# Patient Record
Sex: Male | Born: 1940 | Race: White | Hispanic: No | Marital: Married | Smoking: Never smoker
Health system: Southern US, Community
[De-identification: ages and names within clinical notes are randomized; demographics above are authoritative.]

## PROBLEM LIST (undated history)

## (undated) DIAGNOSIS — M1990 Unspecified osteoarthritis, unspecified site: Secondary | ICD-10-CM

## (undated) DIAGNOSIS — I10 Essential (primary) hypertension: Secondary | ICD-10-CM

## (undated) DIAGNOSIS — C801 Malignant (primary) neoplasm, unspecified: Secondary | ICD-10-CM

## (undated) DIAGNOSIS — I499 Cardiac arrhythmia, unspecified: Secondary | ICD-10-CM

## (undated) DIAGNOSIS — I639 Cerebral infarction, unspecified: Secondary | ICD-10-CM

## (undated) HISTORY — DX: Cerebral infarction, unspecified: I63.9

## (undated) HISTORY — PX: TONSILLECTOMY: SUR1361

## (undated) HISTORY — PX: NO PAST SURGERIES: SHX2092

---

## 2020-03-29 DIAGNOSIS — H40013 Open angle with borderline findings, low risk, bilateral: Secondary | ICD-10-CM | POA: Diagnosis not present

## 2020-03-29 DIAGNOSIS — H2513 Age-related nuclear cataract, bilateral: Secondary | ICD-10-CM | POA: Diagnosis not present

## 2020-03-29 DIAGNOSIS — H35033 Hypertensive retinopathy, bilateral: Secondary | ICD-10-CM | POA: Diagnosis not present

## 2020-03-29 DIAGNOSIS — H353132 Nonexudative age-related macular degeneration, bilateral, intermediate dry stage: Secondary | ICD-10-CM | POA: Diagnosis not present

## 2020-04-03 DIAGNOSIS — Z85828 Personal history of other malignant neoplasm of skin: Secondary | ICD-10-CM | POA: Diagnosis not present

## 2020-04-03 DIAGNOSIS — D229 Melanocytic nevi, unspecified: Secondary | ICD-10-CM | POA: Diagnosis not present

## 2020-04-03 DIAGNOSIS — L905 Scar conditions and fibrosis of skin: Secondary | ICD-10-CM | POA: Diagnosis not present

## 2020-04-03 DIAGNOSIS — D1801 Hemangioma of skin and subcutaneous tissue: Secondary | ICD-10-CM | POA: Diagnosis not present

## 2020-09-11 ENCOUNTER — Emergency Department (HOSPITAL_COMMUNITY): Payer: Medicare PPO

## 2020-09-11 ENCOUNTER — Observation Stay (HOSPITAL_COMMUNITY)
Admission: EM | Admit: 2020-09-11 | Discharge: 2020-09-12 | Disposition: A | Discharge status: Home / Self Care | Payer: Medicare PPO | Attending: Family Medicine | Admitting: Family Medicine

## 2020-09-11 ENCOUNTER — Inpatient Hospital Stay (HOSPITAL_COMMUNITY): Payer: Medicare PPO

## 2020-09-11 ENCOUNTER — Other Ambulatory Visit: Payer: Self-pay

## 2020-09-11 ENCOUNTER — Encounter (HOSPITAL_COMMUNITY): Payer: Self-pay

## 2020-09-11 DIAGNOSIS — H538 Other visual disturbances: Secondary | ICD-10-CM | POA: Diagnosis present

## 2020-09-11 DIAGNOSIS — R29818 Other symptoms and signs involving the nervous system: Secondary | ICD-10-CM | POA: Diagnosis not present

## 2020-09-11 DIAGNOSIS — R2681 Unsteadiness on feet: Secondary | ICD-10-CM | POA: Diagnosis not present

## 2020-09-11 DIAGNOSIS — Z7902 Long term (current) use of antithrombotics/antiplatelets: Secondary | ICD-10-CM | POA: Diagnosis not present

## 2020-09-11 DIAGNOSIS — I517 Cardiomegaly: Secondary | ICD-10-CM | POA: Insufficient documentation

## 2020-09-11 DIAGNOSIS — Z79899 Other long term (current) drug therapy: Secondary | ICD-10-CM | POA: Insufficient documentation

## 2020-09-11 DIAGNOSIS — Z20822 Contact with and (suspected) exposure to covid-19: Secondary | ICD-10-CM | POA: Insufficient documentation

## 2020-09-11 DIAGNOSIS — E7800 Pure hypercholesterolemia, unspecified: Secondary | ICD-10-CM

## 2020-09-11 DIAGNOSIS — I63532 Cerebral infarction due to unspecified occlusion or stenosis of left posterior cerebral artery: Secondary | ICD-10-CM | POA: Diagnosis not present

## 2020-09-11 DIAGNOSIS — J9811 Atelectasis: Secondary | ICD-10-CM | POA: Diagnosis not present

## 2020-09-11 DIAGNOSIS — I639 Cerebral infarction, unspecified: Secondary | ICD-10-CM | POA: Diagnosis present

## 2020-09-11 DIAGNOSIS — I6389 Other cerebral infarction: Secondary | ICD-10-CM | POA: Diagnosis not present

## 2020-09-11 LAB — CBC
HCT: 47.3 % (ref 39.0–52.0)
Hemoglobin: 16.6 g/dL (ref 13.0–17.0)
MCH: 32.1 pg (ref 26.0–34.0)
MCHC: 35.1 g/dL (ref 30.0–36.0)
MCV: 91.5 fL (ref 80.0–100.0)
Platelets: 219 10*3/uL (ref 150–400)
RBC: 5.17 MIL/uL (ref 4.22–5.81)
RDW: 12.5 % (ref 11.5–15.5)
WBC: 10.2 10*3/uL (ref 4.0–10.5)
nRBC: 0 % (ref 0.0–0.2)

## 2020-09-11 LAB — COMPREHENSIVE METABOLIC PANEL
ALT: 23 U/L (ref 0–44)
AST: 30 U/L (ref 15–41)
Albumin: 4.7 g/dL (ref 3.5–5.0)
Alkaline Phosphatase: 60 U/L (ref 38–126)
Anion gap: 12 (ref 5–15)
BUN: 18 mg/dL (ref 8–23)
CO2: 24 mmol/L (ref 22–32)
Calcium: 9.8 mg/dL (ref 8.9–10.3)
Chloride: 102 mmol/L (ref 98–111)
Creatinine, Ser: 1.07 mg/dL (ref 0.61–1.24)
GFR, Estimated: >60 mL/min (ref >60)
Glucose, Bld: 95 mg/dL (ref 70–99)
Potassium: 4 mmol/L (ref 3.5–5.1)
Sodium: 138 mmol/L (ref 135–145)
Total Bilirubin: 1.1 mg/dL (ref 0.3–1.2)
Total Protein: 8.2 g/dL — ABNORMAL HIGH (ref 6.5–8.1)

## 2020-09-11 LAB — DIFFERENTIAL
Abs Immature Granulocytes: 0.03 10*3/uL (ref 0.00–0.07)
Basophils Absolute: 0.1 10*3/uL (ref 0.0–0.1)
Basophils Relative: 1 %
Eosinophils Absolute: 0.1 10*3/uL (ref 0.0–0.5)
Eosinophils Relative: 1 %
Immature Granulocytes: 0 %
Lymphocytes Relative: 18 %
Lymphs Abs: 1.8 10*3/uL (ref 0.7–4.0)
Monocytes Absolute: 0.6 10*3/uL (ref 0.1–1.0)
Monocytes Relative: 6 %
Neutro Abs: 7.7 10*3/uL (ref 1.7–7.7)
Neutrophils Relative %: 74 %

## 2020-09-11 LAB — APTT: aPTT: 26 seconds (ref 24–36)

## 2020-09-11 LAB — PROTIME-INR
INR: 1 (ref 0.8–1.2)
Prothrombin Time: 12.9 seconds (ref 11.4–15.2)

## 2020-09-11 LAB — ETHANOL: Alcohol, Ethyl (B): <10 mg/dL (ref <10)

## 2020-09-11 MED ORDER — ACETAMINOPHEN 160 MG/5ML PO SOLN: 650.0000 mg | ORAL | Status: DC | PRN

## 2020-09-11 MED ORDER — CLOPIDOGREL BISULFATE 300 MG PO TABS
300.0000 mg | ORAL_TABLET | Freq: Once | ORAL | Status: AC
  Administered 2020-09-11: 300 mg via ORAL
  Filled 2020-09-11: qty 1

## 2020-09-11 MED ORDER — ENOXAPARIN SODIUM 40 MG/0.4ML ~~LOC~~ SOLN
40.0000 mg | SUBCUTANEOUS | Status: DC
  Filled 2020-09-11: qty 0.4

## 2020-09-11 MED ORDER — SENNOSIDES-DOCUSATE SODIUM 8.6-50 MG PO TABS: 1.0000 | ORAL_TABLET | Freq: Every evening | ORAL | Status: DC | PRN

## 2020-09-11 MED ORDER — GADOBUTROL 1 MMOL/ML IV SOLN
9.5000 mL | Freq: Once | INTRAVENOUS | Status: AC | PRN
  Administered 2020-09-11: 9.5 mL via INTRAVENOUS

## 2020-09-11 MED ORDER — STROKE: EARLY STAGES OF RECOVERY BOOK
Freq: Once | Status: AC
  Filled 2020-09-11: qty 1

## 2020-09-11 MED ORDER — ACETAMINOPHEN 650 MG RE SUPP: 650.0000 mg | RECTAL | Status: DC | PRN

## 2020-09-11 MED ORDER — SODIUM CHLORIDE 0.9% FLUSH: 3.0000 mL | Freq: Once | INTRAVENOUS | Status: DC

## 2020-09-11 MED ORDER — ASPIRIN 325 MG PO TABS
325.0000 mg | ORAL_TABLET | Freq: Once | ORAL | Status: AC
  Administered 2020-09-11: 325 mg via ORAL
  Filled 2020-09-11: qty 1

## 2020-09-11 MED ORDER — CLOPIDOGREL BISULFATE 75 MG PO TABS
75.0000 mg | ORAL_TABLET | Freq: Every day | ORAL | Status: DC
  Administered 2020-09-12: 75 mg via ORAL
  Filled 2020-09-11: qty 1

## 2020-09-11 MED ORDER — ACETAMINOPHEN 325 MG PO TABS: 650.0000 mg | ORAL_TABLET | ORAL | Status: DC | PRN

## 2020-09-11 MED ORDER — ASPIRIN 325 MG PO TABS: 325.0000 mg | ORAL_TABLET | Freq: Every day | ORAL | Status: DC

## 2020-09-11 MED ORDER — ASPIRIN EC 81 MG PO TBEC: 81.0000 mg | DELAYED_RELEASE_TABLET | Freq: Every day | ORAL | Status: DC

## 2020-09-11 MED ORDER — ASPIRIN 300 MG RE SUPP: 300.0000 mg | Freq: Every day | RECTAL | Status: DC

## 2020-09-11 NOTE — ED Triage Notes (Signed)
Pt reports right eye vision changes for the past 2 days, no other symptoms. Pt a.o, denies headache or dizziness.

## 2020-09-11 NOTE — H&P (Signed)
History and Physical    James Ramsey DJS:[id number] DOB: 1941/05/27 DOA: 09/11/2020  PCP: Aretta Nip, MD  Patient coming from: Home  I have personally briefly reviewed patient's old medical records in [place]  Chief Complaint: Visual field change  HPI: James Ramsey is a 80 y.o. male with no significant PMH.  Pt presents to ED with c/o visual field changes.  2 days ago started having blue and red lights in the R visual field of both eyes.  Symptoms are persistent, constant.  Nothing makes better or worse.  No dizziness, no headache, no confusion, no other weakness nor numbness.  PCP sent pt into ED for evaluation.  Did fall off ladder twisting ankle and hitting head some 3 weeks ago or so.  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   Review of Systems: As per HPI, otherwise all review of systems negative.  History reviewed. No pertinent past medical history.  History reviewed. No pertinent surgical history.   reports that he has never smoked. He does not have any smokeless tobacco history on file. He reports current alcohol use. He reports that he does not use drugs.  No Known Allergies  Family History  Problem Relation Age of Onset  . Breast cancer Mother   . CAD Father   . High Cholesterol Father   . Valvular heart disease Father      Prior to Admission medications   Not on File    Physical Exam: Vitals:   09/11/20 2015 09/11/20 2030 09/11/20 2045 09/11/20 2300  BP: (!) 164/89 (!) 172/87 (!) 163/83 (!) 183/92  Pulse: 67 65 63 66  Resp: 16 13 15 17   Temp:      TempSrc:      SpO2: 100% 100% 99% 98%  Weight:      Height:        Constitutional: NAD, calm, comfortable Eyes: PERRL, lids and conjunctivae normal ENMT: Mucous membranes are moist. Posterior pharynx clear of any exudate or lesions.Normal dentition.  Neck: normal, supple, no masses, no thyromegaly Respiratory: clear to auscultation  bilaterally, no wheezing, no crackles. Normal respiratory effort. No accessory muscle use.  Cardiovascular: Regular rate and rhythm, no murmurs / rubs / gallops. No extremity edema. 2+ pedal pulses. No carotid bruits.  Abdomen: no tenderness, no masses palpated. No hepatosplenomegaly. Bowel sounds positive.  Musculoskeletal: no clubbing / cyanosis. No joint deformity upper and lower extremities. Good ROM, no contractures. Normal muscle tone.  Skin: no rashes, lesions, ulcers. No induration Neurologic: CN 2-12 grossly intact. Sensation intact, DTR normal. Strength 5/5 in all 4.  Psychiatric: Normal judgment and insight. Alert and oriented x 3. Normal mood.    Labs on Admission: I have personally reviewed following labs and imaging studies  CBC: Recent Labs  Lab 09/11/20 1525  WBC 10.2  NEUTROABS 7.7  HGB 16.6  HCT 47.3  MCV 91.5  PLT 885   Basic Metabolic Panel: Recent Labs  Lab 09/11/20 1525  NA 138  K 4.0  CL 102  CO2 24  GLUCOSE 95  BUN 18  CREATININE 1.07  CALCIUM 9.8   GFR: Estimated Creatinine Clearance: 66.6 mL/min (by C-G formula based on SCr of 1.07 mg/dL). Liver Function Tests: Recent Labs  Lab 09/11/20 1525  AST 30  ALT 23  ALKPHOS 60  BILITOT 1.1  PROT 8.2*  ALBUMIN 4.7   No results for input(s): LIPASE, AMYLASE in the last 168 hours. No results for  input(s): AMMONIA in the last 168 hours. Coagulation Profile: Recent Labs  Lab 09/11/20 1525  INR 1.0   Cardiac Enzymes: No results for input(s): CKTOTAL, CKMB, CKMBINDEX, TROPONINI in the last 168 hours. BNP (last 3 results) No results for input(s): PROBNP in the last 8760 hours. HbA1C: No results for input(s): HGBA1C in the last 72 hours. CBG: No results for input(s): GLUCAP in the last 168 hours. Lipid Profile: No results for input(s): CHOL, HDL, LDLCALC, TRIG, CHOLHDL, LDLDIRECT in the last 72 hours. Thyroid Function Tests: No results for input(s): TSH, T4TOTAL, FREET4, T3FREE, THYROIDAB in  the last 72 hours. Anemia Panel: No results for input(s): VITAMINB12, FOLATE, FERRITIN, TIBC, IRON, RETICCTPCT in the last 72 hours. Urine analysis: No results found for: COLORURINE, APPEARANCEUR, LABSPEC, Central, GLUCOSEU, HGBUR, BILIRUBINUR, KETONESUR, PROTEINUR, UROBILINOGEN, NITRITE, LEUKOCYTESUR  Radiological Exams on Admission: DG Chest 2 View  Result Date: 09/11/2020 CLINICAL DATA:  Acute ischemic stroke. EXAM: CHEST - 2 VIEW COMPARISON:  None. FINDINGS: Mild eventration of left hemidiaphragm. The heart is normal in size. Normal mediastinal contours. Subsegmental opacities at both lung bases. No pleural fluid or pneumothorax. No pulmonary edema. Thoracic spondylosis. No acute osseous abnormalities are seen IMPRESSION: Subsegmental bibasilar atelectasis. Electronically Signed   By: Keith Rake M.D.   On: 09/11/2020 23:33   CT HEAD WO CONTRAST  Result Date: 09/11/2020 CLINICAL DATA:  Acute neurologic deficit, possible stroke. Visual changes in the right eye. EXAM: CT HEAD WITHOUT CONTRAST TECHNIQUE: Contiguous axial images were obtained from the base of the skull through the vertex without intravenous contrast. COMPARISON:  None FINDINGS: Brain: Periventricular white matter and corona radiata hypodensities favor chronic ischemic microvascular white matter disease, mildly confluent along the anterior limb of the left internal capsule for example on image 12 series 4, probably from confluent chronic ischemic microvascular white matter disease. No specific findings of acute CVA, mass lesion, or intracranial hemorrhage. The brainstem, cerebellum, cerebral peduncles, thalami, and basal ganglia appear unremarkable as does the ventricular system. Vascular: Unremarkable Skull: Unremarkable Sinuses/Orbits: Unremarkable. Other: No supplemental non-categorized findings. IMPRESSION: 1. No acute intracranial findings. 2. Periventricular white matter and corona radiata hypodensities favor chronic ischemic  microvascular white matter disease, mildly confluent along the anterior limb of the left internal capsule. Electronically Signed   By: Van Clines M.D.   On: 09/11/2020 17:35   MR Brain W and Wo Contrast  Result Date: 09/11/2020 CLINICAL DATA:  Right eye visual changes for 2 days. Possible stroke. EXAM: MRI HEAD WITHOUT AND WITH CONTRAST TECHNIQUE: Multiplanar, multiecho pulse sequences of the brain and surrounding structures were obtained without and with intravenous contrast. CONTRAST:  9.66mL GADAVIST GADOBUTROL 1 MMOL/ML IV SOLN COMPARISON:  Head CT 09/11/2020 FINDINGS: Brain: There are small foci of trace diffusion weighted hyperintensity and mild T2 FLAIR hyperintensity involving left occipital cortex with normal ADC and subtle enhancement favoring early subacute infarcts with this history. A punctate early subacute infarct is also suspected in the mesial left temporal lobe. Scattered small foci of T2 hyperintensity in the cerebral white matter bilaterally are nonspecific but compatible with mild chronic small vessel ischemic disease. No intracranial hemorrhage, mass, midline shift, or extra-axial fluid collection is identified there is mild generalized cerebral atrophy. Vascular: Major intracranial vascular flow voids are preserved with the right vertebral artery being hypoplastic. Skull and upper cervical spine: Unremarkable bone marrow signal. Sinuses/Orbits: Unremarkable orbits. Mild mucosal thickening in the paranasal sinuses. Clear mastoid air cells. Other: None. IMPRESSION: 1. Small early subacute left occipital and  left temporal lobe infarcts. 2. Mild chronic small vessel ischemic disease. Electronically Signed   By: Logan Bores M.D.   On: 09/11/2020 21:46    EKG: Independently reviewed.  Assessment/Plan Active Problems:   Acute ischemic stroke ([place])    1. Acute ischemic stroke - 1. Stroke pathway 2. Tele monitor 3. Neuro consult 4. 2d echo 5. CTA head and neck 1. BP goals  pending results 6. ASA 325 and Plavix 300 now 7. ASA 81 daily permanently and Plavix 75 daily for 21 days 8. PT/OT/SLP  DVT prophylaxis: Lovenox Code Status: Full Family Communication: No family in room Disposition Plan: Home after stroke work up and treatment Consults called: Neurology Admission status: Admit to inpatient  Severity of Illness: The appropriate patient status for this patient is INPATIENT. Inpatient status is judged to be reasonable and necessary in order to provide the required intensity of service to ensure the patient's safety. The patient's presenting symptoms, physical exam findings, and initial radiographic and laboratory data in the context of their chronic comorbidities is felt to place them at high risk for further clinical deterioration. Furthermore, it is not anticipated that the patient will be medically stable for discharge from the hospital within 2 midnights of admission. The following factors support the patient status of inpatient.   IP status due to acute ischemic stroke  * I certify that at the point of admission it is my clinical judgment that the patient will require inpatient hospital care spanning beyond 2 midnights from the point of admission due to high intensity of service, high risk for further deterioration and high frequency of surveillance required.*    Keisean Skowron M. DO [facility]  How to contact the [facility] Attending or Consulting provider [place] or covering provider during after hours [place], for this patient?  1. Check the care team in [facility] and look for a) attending/consulting [facility] provider listed and b) the [facility] team listed 2. Log into [email or web address]  Amion Physician Scheduling and messaging for groups and whole hospitals  On call and physician scheduling software for group practices, residents, hospitalists and other medical providers for call, clinic, rotation and shift schedules. On[employer] is a hospital-wide system for  scheduling doctors and paging doctors on call. EasyPlot is for scientific plotting and data analysis.  [email or web address]  and use [place]'s universal password to access. If you do not have the password, please contact the hospital operator.  3. Locate the [facility] provider you are looking for under [facility] and page to a number that you can be directly reached. 4. If you still have difficulty reaching the provider, please page the [facility] (Director on Call) for the Hospitalists listed on amion for assistance.  09/11/2020, 11:39 PM

## 2020-09-11 NOTE — ED Provider Notes (Signed)
[facility] EMERGENCY DEPARTMENT Provider Note   CSN: [id number] Arrival date & time: 09/11/20  1412     History Chief Complaint  Patient presents with  . Visual Field Change    James Ramsey is a 80 y.o. male.  HPI Patient presents with visual changes.  Is had for around the last 2 days.  States there is blue and red lights on the right aspect of both eyes.  Still able to state despite it but is having some changes.  Has been there constantly.  Even there when he closes eyes.  No headache.  No confusion.  No other numbness or weakness.  Has not had episodes like this before.  Does move well his vision moves.  PCP sent him in for further evaluation.  No other numbness or weakness.  No dizziness.    History reviewed. No pertinent past medical history.  There are no problems to display for this patient.   History reviewed. No pertinent surgical history.     No family history on file.     Home Medications Prior to Admission medications   Not on File    Allergies    Patient has no allergy information on record.  Review of Systems   Review of Systems  Constitutional: Negative for appetite change.  HENT: Negative for congestion.   Eyes: Positive for visual disturbance.  Respiratory: Negative for shortness of breath.   Gastrointestinal: Negative for abdominal pain.  Genitourinary: Negative for flank pain.  Skin: Negative for rash.  Neurological: Positive for headaches.  Hematological: Negative for adenopathy.  Psychiatric/Behavioral: Negative for agitation.    Physical Exam Updated Vital Signs BP (!) 163/83   Pulse 63   Temp 98.5 F (36.9 C)   Resp 15   Ht 5\' 11"  (1.803 m)   Wt 90.7 kg   SpO2 99%   BMI 27.89 kg/m   Physical Exam Vitals and nursing note reviewed.  Constitutional:      Appearance: Normal appearance.  HENT:     Head: Atraumatic.     Right Ear: External ear normal.     Left Ear: External ear normal.      Mouth/Throat:     Mouth: Mucous membranes are moist.  Eyes:     Extraocular Movements: Extraocular movements intact.     Pupils: Pupils are equal, round, and reactive to light.     Comments: Visual fields grossly intact by confrontation.  Cardiovascular:     Rate and Rhythm: Regular rhythm.     Heart sounds: Normal heart sounds.  Pulmonary:     Breath sounds: No wheezing.  Chest:     Chest wall: No tenderness.  Abdominal:     Tenderness: There is no abdominal tenderness.  Musculoskeletal:        General: No tenderness.     Cervical back: Neck supple.  Neurological:     General: No focal deficit present.     Mental Status: He is alert and oriented to person, place, and time.     Cranial Nerves: No cranial nerve deficit.     Motor: No weakness.     Coordination: Coordination normal.     Gait: Gait normal.  Psychiatric:        Mood and Affect: Mood normal.     ED Results / Procedures / Treatments   Labs (all labs ordered are listed, but only abnormal results are displayed) Labs Reviewed  COMPREHENSIVE METABOLIC PANEL - Abnormal; Notable for the following  components:      Result Value   Total Protein 8.2 (*)    All other components within normal limits  PROTIME-INR  APTT  CBC  DIFFERENTIAL    EKG EKG Interpretation  Date/Time:  Monday September 11 2020 15:33:25 EST Ventricular Rate:  84 PR Interval:  188 QRS Duration: 80 QT Interval:  362 QTC Calculation: 427 R Axis:   90 Text Interpretation: Normal sinus rhythm Rightward axis Borderline ECG Confirmed by Davonna Belling [phone]) on 09/11/2020 10:24:26 PM   Radiology CT HEAD WO CONTRAST  Result Date: 09/11/2020 CLINICAL DATA:  Acute neurologic deficit, possible stroke. Visual changes in the right eye. EXAM: CT HEAD WITHOUT CONTRAST TECHNIQUE: Contiguous axial images were obtained from the base of the skull through the vertex without intravenous contrast. COMPARISON:  None FINDINGS: Brain: Periventricular white  matter and corona radiata hypodensities favor chronic ischemic microvascular white matter disease, mildly confluent along the anterior limb of the left internal capsule for example on image 12 series 4, probably from confluent chronic ischemic microvascular white matter disease. No specific findings of acute CVA, mass lesion, or intracranial hemorrhage. The brainstem, cerebellum, cerebral peduncles, thalami, and basal ganglia appear unremarkable as does the ventricular system. Vascular: Unremarkable Skull: Unremarkable Sinuses/Orbits: Unremarkable. Other: No supplemental non-categorized findings. IMPRESSION: 1. No acute intracranial findings. 2. Periventricular white matter and corona radiata hypodensities favor chronic ischemic microvascular white matter disease, mildly confluent along the anterior limb of the left internal capsule. Electronically Signed   By: Van Clines M.D.   On: 09/11/2020 17:35   MR Brain W and Wo Contrast  Result Date: 09/11/2020 CLINICAL DATA:  Right eye visual changes for 2 days. Possible stroke. EXAM: MRI HEAD WITHOUT AND WITH CONTRAST TECHNIQUE: Multiplanar, multiecho pulse sequences of the brain and surrounding structures were obtained without and with intravenous contrast. CONTRAST:  9.61mL GADAVIST GADOBUTROL 1 MMOL/ML IV SOLN COMPARISON:  Head CT 09/11/2020 FINDINGS: Brain: There are small foci of trace diffusion weighted hyperintensity and mild T2 FLAIR hyperintensity involving left occipital cortex with normal ADC and subtle enhancement favoring early subacute infarcts with this history. A punctate early subacute infarct is also suspected in the mesial left temporal lobe. Scattered small foci of T2 hyperintensity in the cerebral white matter bilaterally are nonspecific but compatible with mild chronic small vessel ischemic disease. No intracranial hemorrhage, mass, midline shift, or extra-axial fluid collection is identified there is mild generalized cerebral atrophy.  Vascular: Major intracranial vascular flow voids are preserved with the right vertebral artery being hypoplastic. Skull and upper cervical spine: Unremarkable bone marrow signal. Sinuses/Orbits: Unremarkable orbits. Mild mucosal thickening in the paranasal sinuses. Clear mastoid air cells. Other: None. IMPRESSION: 1. 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 2. Mild chronic small vessel ischemic disease. Electronically Signed   By: Logan Bores M.D.   On: 09/11/2020 21:46    Procedures Procedures (including critical care time)  Medications Ordered in ED Medications  sodium chloride flush (NS) 0.9 % injection 3 mL (3 mLs Intravenous Not Given 09/11/20 1935)  gadobutrol (GADAVIST) 1 MMOL/ML injection 9.5 mL (9.5 mLs Intravenous Contrast Given 09/11/20 2131)    ED Course  I have reviewed the triage vital signs and the nursing notes.  Pertinent labs & imaging results that were available during my care of the patient were reviewed by me and considered in my medical decision making (see chart for details).    MDM Rules/Calculators/A&P  Patient with vision change.  Began around 3 days ago.  On the right aspect of vision on both eyes.  Sees blue and red lights.  No headache.  CT is reassuring but MRI shows possible acute strokes.  Not a tPA candidate due to time of onset of 3 days ago and mild deficits.  No other deficits.  Discussed with neurology.  Will see patient.  Will admit to hospitalist however.   Final Clinical Impression(s) / ED Diagnoses Final diagnoses:  Cerebrovascular accident (CVA), unspecified mechanism [facility])    Rx / [place] Orders ED Discharge Orders    None       Davonna Belling, MD 09/11/20 2230

## 2020-09-11 NOTE — ED Notes (Signed)
Pt not responding to vital check. 

## 2020-09-11 NOTE — Consult Note (Signed)
Neurology Consultation Reason for Consult: Stroke Requesting Physician: Davonna Belling   CC: Visual change   History is obtained from:   HPI: Lorence Nagengast is a 80 y.o. male in excellent health taking only vitamin D and calcium twice weekly, who sees a doctor yearly and is due for his next checkup in April.  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 These started over the weekend and given that the symptoms are relatively minor he did not call his doctor about it until Monday at which time he was told to come to the ED for further evaluation. He reports it is usual for his blood pressure to be elevated as it has been in the ED (180/90).  His review of some systems is pan negative other than a fall while cleaning leaves off of his roof. He describes this as falling about 6 to 8 feet when his ladder slipped out from underneath him. He was careful to protect his head against striking the ground though he thinks he may have had some whiplash injury. 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    LKW: 09/16/2019 tPA given?: No, due to out of the window Premorbid modified rankin scale:      0 - No symptoms  ROS: A 14 point ROS was performed and is negative except as noted in the HPI.   History reviewed. No pertinent past medical history.  Family History  Problem Relation Age of Onset   Breast cancer Mother    CAD Father    High Cholesterol Father    Valvular heart disease Father    Social History:  reports that he has never smoked. He does not have any smokeless tobacco history on file. He reports current alcohol use. He reports that he does not  use drugs.   Exam: Current vital signs: BP (!) 172/87    Pulse 65    Temp 98.5 F (36.9 C)    Resp 13    Ht 5\' 11"  (1.803 m)    Wt 90.7 kg    SpO2 100%    BMI 27.89 kg/m  Vital signs in last 24 hours: Temp:  [98.4 F (36.9 C)-98.5 F (36.9 C)] 98.5 F (36.9 C) (01/10 1752) Pulse Rate:  [64-82] 65 (01/10 2030) Resp:  [13-18] 13 (01/10 2030) BP: (164-190)/(82-103) 172/87 (01/10 2030) SpO2:  [98 %-100 %] 100 % (01/10 2030) Weight:  [90.7 kg] 90.7 kg (01/10 1511)   Physical Exam  Constitutional: Appears well-developed and well-nourished.  Psych: Affect appropriate to situation Eyes: No scleral injection HENT: No OP obstruction, good dentition MSK: no joint deformities.  Cardiovascular: Normal rate and regular rhythm.  Respiratory: Effort normal, non-labored breathing GI: Soft.  No distension. There is no tenderness.  Skin: WDI  Neuro: Mental Status: Patient is awake, alert, oriented to person, place, month, year, and situation. Patient is able to give a clear and coherent history. No signs of aphasia or neglect Cranial Nerves: II: Visual Fields are full. Pupils are equal, round, and reactive to light.   III,IV, VI: EOMI without ptosis or diploplia.  V: Facial sensation is symmetric to temperature VII: Facial movement  is symmetric.  VIII: hearing is intact to voice X: Uvula elevates symmetrically XI: Shoulder shrug is symmetric. XII: tongue is midline without atrophy or fasciculations.  Motor: Tone is normal. Bulk is normal. 5/5 strength was present in all four extremities.  Sensory: Sensation is symmetric to light touch and temperature in the arms and legs. Deep Tendon Reflexes: 2+ and symmetric in the biceps and patellae.  Plantars: Toes are downgoing bilaterally.  Cerebellar: FNF and HKS are intact bilaterally  NIHSS total 0     I have reviewed labs in epic and the results pertinent to this consultation are: Lab Results  Component Value Date   WBC 10.2  09/11/2020   HGB 16.6 09/11/2020   HCT 47.3 09/11/2020   MCV 91.5 09/11/2020   PLT 219 15/72/6203   Last metabolic panel Lab Results  Component Value Date   GLUCOSE 95 09/11/2020   NA 138 09/11/2020   K 4.0 09/11/2020   CL 102 09/11/2020   CO2 24 09/11/2020   BUN 18 09/11/2020   CREATININE 1.07 09/11/2020   GFRNONAA >60 09/11/2020   CALCIUM 9.8 09/11/2020   PROT 8.2 (H) 09/11/2020   ALBUMIN 4.7 09/11/2020   BILITOT 1.1 09/11/2020   ALKPHOS 60 09/11/2020   AST 30 09/11/2020   ALT 23 09/11/2020   ANIONGAP 12 09/11/2020     I have reviewed the images obtained: MRI brain with punctate infarcts primarily in the left PCA, but also in the left temporal lobe. These images were reviewed personally with the patient.   Impression: This is a 80 year old otherwise healthy man presenting with multifocal strokes, which could be due to central embolic source versus possibly left carotid source if a fetal P-comm artery variant is present.   Recommendations:  # Punctate multifocal strokes in the left PCA and left MCA territory - Stroke labs HgbA1c, fasting lipid panel, UA, UDS - MRI brain  - CTA head and neck - Frequent neuro checks - Echocardiogram - Aspirin - dose 325mg  PO or 300mg  PR, followed by 81 mg daily - Plavix 300 mg load with 75 mg daily for 21 day course  - Risk factor modification - Telemetry monitoring; 30 day event monitor on discharge if no arrythmias captured  - Blood pressure goal   - Permissive hypertension until no critical stenosis is confirmed on vessel imaging - PT consult, OT consult, Speech consult, unless patient is back to baseline - Stroke team to follow  [place] [phone]

## 2020-09-12 ENCOUNTER — Inpatient Hospital Stay (HOSPITAL_COMMUNITY): Payer: Medicare PPO

## 2020-09-12 ENCOUNTER — Inpatient Hospital Stay (HOSPITAL_BASED_OUTPATIENT_CLINIC_OR_DEPARTMENT_OTHER): Payer: Medicare PPO

## 2020-09-12 ENCOUNTER — Other Ambulatory Visit: Payer: Self-pay | Admitting: Adult Health

## 2020-09-12 DIAGNOSIS — I6389 Other cerebral infarction: Secondary | ICD-10-CM | POA: Diagnosis not present

## 2020-09-12 DIAGNOSIS — I639 Cerebral infarction, unspecified: Secondary | ICD-10-CM

## 2020-09-12 DIAGNOSIS — H539 Unspecified visual disturbance: Secondary | ICD-10-CM | POA: Diagnosis not present

## 2020-09-12 DIAGNOSIS — E7800 Pure hypercholesterolemia, unspecified: Secondary | ICD-10-CM | POA: Diagnosis not present

## 2020-09-12 LAB — ECHOCARDIOGRAM COMPLETE
Area-P 1/2: 4.15 cm2
Height: 71 in
S' Lateral: 3.2 cm
Weight: 3200 oz

## 2020-09-12 LAB — RAPID URINE DRUG SCREEN, HOSP PERFORMED
Amphetamines: NOT DETECTED
Barbiturates: NOT DETECTED
Benzodiazepines: NOT DETECTED
Cocaine: NOT DETECTED
Opiates: NOT DETECTED
Tetrahydrocannabinol: NOT DETECTED

## 2020-09-12 LAB — URINALYSIS, ROUTINE W REFLEX MICROSCOPIC
Bilirubin Urine: NEGATIVE
Glucose, UA: NEGATIVE mg/dL
Hgb urine dipstick: NEGATIVE
Ketones, ur: NEGATIVE mg/dL
Leukocytes,Ua: NEGATIVE
Nitrite: NEGATIVE
Protein, ur: NEGATIVE mg/dL
Specific Gravity, Urine: >1.046 — ABNORMAL HIGH (ref 1.005–1.030)
pH: 5 (ref 5.0–8.0)

## 2020-09-12 LAB — LIPID PANEL
Cholesterol: 186 mg/dL (ref 0–200)
HDL: 44 mg/dL (ref >40)
LDL Cholesterol: 128 mg/dL — ABNORMAL HIGH (ref 0–99)
Total CHOL/HDL Ratio: 4.2 RATIO
Triglycerides: 69 mg/dL (ref <150)
VLDL: 14 mg/dL (ref 0–40)

## 2020-09-12 LAB — SARS CORONAVIRUS 2 (TAT 6-24 HRS): SARS Coronavirus 2: NEGATIVE

## 2020-09-12 LAB — HEMOGLOBIN A1C
Hgb A1c MFr Bld: 5.1 % (ref 4.8–5.6)
Mean Plasma Glucose: 99.67 mg/dL

## 2020-09-12 MED ORDER — ASPIRIN 325 MG PO TBEC: 325.0000 mg | DELAYED_RELEASE_TABLET | Freq: Every day | ORAL | 0 refills | Status: DC

## 2020-09-12 MED ORDER — ATORVASTATIN CALCIUM 40 MG PO TABS: 40.0000 mg | ORAL_TABLET | Freq: Every day | ORAL | 11 refills | Status: DC

## 2020-09-12 MED ORDER — IOHEXOL 350 MG/ML SOLN
80.0000 mL | Freq: Once | INTRAVENOUS | Status: AC | PRN
  Administered 2020-09-12: 80 mL via INTRAVENOUS

## 2020-09-12 MED ORDER — ASPIRIN EC 325 MG PO TBEC
325.0000 mg | DELAYED_RELEASE_TABLET | Freq: Every day | ORAL | Status: DC
  Administered 2020-09-12: 325 mg via ORAL
  Filled 2020-09-12: qty 1

## 2020-09-12 MED ORDER — CLOPIDOGREL BISULFATE 75 MG PO TABS: 75.0000 mg | ORAL_TABLET | Freq: Every day | ORAL | 11 refills | Status: DC

## 2020-09-12 MED ORDER — ATORVASTATIN CALCIUM 40 MG PO TABS
40.0000 mg | ORAL_TABLET | Freq: Every day | ORAL | Status: DC
  Administered 2020-09-12: 40 mg via ORAL
  Filled 2020-09-12: qty 4

## 2020-09-12 NOTE — ED Notes (Signed)
Lunch Tray Ordered @ 1715. 

## 2020-09-12 NOTE — Progress Notes (Signed)
  Echocardiogram 2D Echocardiogram has been performed.  James Ramsey 09/12/2020, 4:07 PM

## 2020-09-12 NOTE — Progress Notes (Addendum)
STROKE TEAM PROGRESS NOTE   INTERVAL HISTORY No acute events since admission.  Patient is calm and cooperative. Sitting up in bed in NAD. 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 Recalls neuroanatomy and function. 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  Denies recurrence of presenting visual disturbance since admission. Reports he sees flashes of light intermittently at times when his eyes are closed.   MR brain and CTA head and neck reviewed by Dr. Erlinda Hong with patient. Current stroke diagnosis, possible etiologies and treatment plan discussed extensively with patient. All questions answered.   Vitals:   09/12/20 0715 09/12/20 0730 09/12/20 0900 09/12/20 1019  BP: 134/69 135/73 136/69   Pulse: (!) 57 (!) 54 61   Resp: 16 14 17    Temp:  98.1 F (36.7 C)  98.6 F (37 C)  TempSrc:  Oral  Oral  SpO2: 100% 100% 99%   Weight:      Height:       CBC:  Recent Labs  Lab 09/11/20 1525  WBC 10.2  NEUTROABS 7.7  HGB 16.6  HCT 47.3  MCV 91.5  PLT [id number]   Basic Metabolic Panel:  Recent Labs  Lab 09/11/20 1525  NA 138  K 4.0  CL 102  CO2 24  GLUCOSE 95  BUN 18  CREATININE 1.07  CALCIUM 9.8   Lipid Panel:  Recent Labs  Lab 09/12/20 0334  CHOL 186  TRIG 69  HDL 44  CHOLHDL 4.2  VLDL 14  LDLCALC 128*   HgbA1c:  Recent Labs  Lab 09/12/20 0334  HGBA1C 5.1   Urine Drug Screen:  Recent Labs  Lab 09/12/20 0656  LABOPIA NONE DETECTED  COCAINSCRNUR NONE DETECTED  LABBENZ NONE DETECTED  AMPHETMU NONE DETECTED  THCU NONE DETECTED  LABBARB NONE DETECTED    Alcohol Level  Recent Labs  Lab 09/11/20 2232  ETH <10    IMAGING past 24 hours CT ANGIO HEAD W OR WO CONTRAST  Result Date: 09/12/2020 CLINICAL DATA:  Right eye vision changes for 2 days. EXAM: CT  ANGIOGRAPHY HEAD AND NECK TECHNIQUE: Multidetector CT imaging of the head and neck was performed using the standard protocol during bolus administration of intravenous contrast. Multiplanar CT image reconstructions and MIPs were obtained to evaluate the vascular anatomy. Carotid stenosis measurements (when applicable) are obtained utilizing NASCET criteria, using the distal internal carotid diameter as the denominator. CONTRAST:  23mL OMNIPAQUE IOHEXOL 350 MG/ML SOLN COMPARISON:  None. FINDINGS: CTA NECK FINDINGS SKELETON: There is no bony spinal canal stenosis. No lytic or blastic lesion. OTHER NECK: Normal pharynx, larynx and major salivary glands. No cervical lymphadenopathy. Unremarkable thyroid gland. UPPER CHEST: No pneumothorax or pleural effusion. No nodules or masses. AORTIC ARCH: There is no calcific atherosclerosis of the aortic arch. There is no aneurysm, dissection or hemodynamically significant stenosis of the visualized portion of the aorta. Conventional 3 vessel aortic branching pattern. The visualized proximal subclavian arteries are widely patent. RIGHT CAROTID SYSTEM: Normal without aneurysm, dissection or stenosis. LEFT CAROTID SYSTEM: Normal without aneurysm, dissection or stenosis. VERTEBRAL ARTERIES: Left dominant configuration. Both origins are clearly patent. There is no dissection, occlusion or flow-limiting stenosis to the skull base (V1-V3 segments). CTA HEAD FINDINGS POSTERIOR CIRCULATION: --Vertebral arteries: Right vertebral artery terminates in PICA --Inferior cerebellar arteries: Normal. --Basilar artery: Normal. --Superior cerebellar arteries: Normal. --Posterior  cerebral arteries (PCA): Normal. ANTERIOR CIRCULATION: --Intracranial internal carotid arteries: Normal. --Anterior cerebral arteries (ACA): Normal. Absent left A1 segment, normal variant --Middle cerebral arteries (MCA): Normal. VENOUS SINUSES: As permitted by contrast timing, patent. ANATOMIC VARIANTS: None Review of the  MIP images confirms the above findings. IMPRESSION: Normal CTA of the head and neck. Electronically Signed   By: Ulyses Jarred M.D.   On: 09/12/2020 01:39   DG Chest 2 View  Result Date: 09/11/2020 CLINICAL DATA:  Acute ischemic stroke. EXAM: CHEST - 2 VIEW COMPARISON:  None. FINDINGS: Mild eventration of left hemidiaphragm. The heart is normal in size. Normal mediastinal contours. Subsegmental opacities at both lung bases. No pleural fluid or pneumothorax. No pulmonary edema. Thoracic spondylosis. No acute osseous abnormalities are seen IMPRESSION: Subsegmental bibasilar atelectasis. Electronically Signed   By: Keith Rake M.D.   On: 09/11/2020 23:33   CT HEAD WO CONTRAST  Result Date: 09/11/2020 CLINICAL DATA:  Acute neurologic deficit, possible stroke. Visual changes in the right eye. EXAM: CT HEAD WITHOUT CONTRAST TECHNIQUE: Contiguous axial images were obtained from the base of the skull through the vertex without intravenous contrast. COMPARISON:  None FINDINGS: Brain: Periventricular white matter and corona radiata hypodensities favor chronic ischemic microvascular white matter disease, mildly confluent along the anterior limb of the left internal capsule for example on image 12 series 4, probably from confluent chronic ischemic microvascular white matter disease. No specific findings of acute CVA, mass lesion, or intracranial hemorrhage. The brainstem, cerebellum, cerebral peduncles, thalami, and basal ganglia appear unremarkable as does the ventricular system. Vascular: Unremarkable Skull: Unremarkable Sinuses/Orbits: Unremarkable. Other: No supplemental non-categorized findings. IMPRESSION: 1. No acute intracranial findings. 2. Periventricular white matter and corona radiata hypodensities favor chronic ischemic microvascular white matter disease, mildly confluent along the anterior limb of the left internal capsule. Electronically Signed   By: Van Clines M.D.   On: 09/11/2020 17:35    CT ANGIO NECK W OR WO CONTRAST  Result Date: 09/12/2020 CLINICAL DATA:  Right eye vision changes for 2 days. EXAM: CT ANGIOGRAPHY HEAD AND NECK TECHNIQUE: Multidetector CT imaging of the head and neck was performed using the standard protocol during bolus administration of intravenous contrast. Multiplanar CT image reconstructions and MIPs were obtained to evaluate the vascular anatomy. Carotid stenosis measurements (when applicable) are obtained utilizing NASCET criteria, using the distal internal carotid diameter as the denominator. CONTRAST:  82mL OMNIPAQUE IOHEXOL 350 MG/ML SOLN COMPARISON:  None. FINDINGS: CTA NECK FINDINGS SKELETON: There is no bony spinal canal stenosis. No lytic or blastic lesion. OTHER NECK: Normal pharynx, larynx and major salivary glands. No cervical lymphadenopathy. Unremarkable thyroid gland. UPPER CHEST: No pneumothorax or pleural effusion. No nodules or masses. AORTIC ARCH: There is no calcific atherosclerosis of the aortic arch. There is no aneurysm, dissection or hemodynamically significant stenosis of the visualized portion of the aorta. Conventional 3 vessel aortic branching pattern. The visualized proximal subclavian arteries are widely patent. RIGHT CAROTID SYSTEM: Normal without aneurysm, dissection or stenosis. LEFT CAROTID SYSTEM: Normal without aneurysm, dissection or stenosis. VERTEBRAL ARTERIES: Left dominant configuration. Both origins are clearly patent. There is no dissection, occlusion or flow-limiting stenosis to the skull base (V1-V3 segments). CTA HEAD FINDINGS POSTERIOR CIRCULATION: --Vertebral arteries: Right vertebral artery terminates in PICA --Inferior cerebellar arteries: Normal. --Basilar artery: Normal. --Superior cerebellar arteries: Normal. --Posterior cerebral arteries (PCA): Normal. ANTERIOR CIRCULATION: --Intracranial internal carotid arteries: Normal. --Anterior cerebral arteries (ACA): Normal. Absent left A1 segment, normal variant --Middle  cerebral arteries (MCA): Normal. VENOUS  SINUSES: As permitted by contrast timing, patent. ANATOMIC VARIANTS: None Review of the MIP images confirms the above findings. IMPRESSION: Normal CTA of the head and neck. Electronically Signed   By: Ulyses Jarred M.D.   On: 09/12/2020 01:39   MR Brain W and Wo Contrast  Result Date: 09/11/2020 CLINICAL DATA:  Right eye visual changes for 2 days. Possible stroke. EXAM: MRI HEAD WITHOUT AND WITH CONTRAST TECHNIQUE: Multiplanar, multiecho pulse sequences of the brain and surrounding structures were obtained without and with intravenous contrast. CONTRAST:  9.30mL GADAVIST GADOBUTROL 1 MMOL/ML IV SOLN COMPARISON:  Head CT 09/11/2020 FINDINGS: Brain: There are small foci of trace diffusion weighted hyperintensity and mild T2 FLAIR hyperintensity involving left occipital cortex with normal ADC and subtle enhancement favoring early subacute infarcts with this history. A punctate early subacute infarct is also suspected in the mesial left temporal lobe. Scattered small foci of T2 hyperintensity in the cerebral white matter bilaterally are nonspecific but compatible with mild chronic small vessel ischemic disease. No intracranial hemorrhage, mass, midline shift, or extra-axial fluid collection is identified there is mild generalized cerebral atrophy. Vascular: Major intracranial vascular flow voids are preserved with the right vertebral artery being hypoplastic. Skull and upper cervical spine: Unremarkable bone marrow signal. Sinuses/Orbits: Unremarkable orbits. Mild mucosal thickening in the paranasal sinuses. Clear mastoid air cells. Other: None. IMPRESSION: 1. 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 2. Mild chronic small vessel ischemic disease. Electronically Signed   By: Logan Bores M.D.   On: 09/11/2020 21:46   PHYSICAL EXAM General  80 yo male lying in bed in NAD Well developed Normocephalic Atraumatic  MS Alert, oriented  x4 Conversant with clear speech and interactive  Neuro PERRL   VFF to confrontation  Face symmetric Facial sensation intact V1-V3 TML without   Motor: 5/5 bilateral upper extremities throughout 5/5 bilateral lowe extremities throughout    ASSESSMENT/PLAN Mr. Eliu Batch is a 80 y.o. male with history of  presenting with visual disturbance which onset 2 days ago.   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 Erlinda Hong.  Dr. Posey Pronto with radiology reviewed imaging and agrees. He will reach out to Dr. Collins Scotland who is on duty tonight to addend read.   MRI: Small early subacute left occipital and left temporal lobe infarcts.  2D Echo Pending  Recommend Loop placement to rule out afib. Scheduled outpt 10/02/20  LE venous doppler no DVT  LDL 128  HgbA1c 5.1  VTE PPX: Lovenox 40mg     No anticoagulant/antiplatelet prior to admission, now on ASA 325 mg and Plavix 75 mg DAPT x 3 months then ASA 325mg  alone given high grade intracranial stenosis.   Therapy recommendations:  outpt PT  Disposition:  Likely home.  Hyperlipidemia  Home meds:  none   LDL 128, goal < 70  Atorvastatin 40mg  daily initiated  Continue statin at discharge  Other Stroke Risk Factors  Advanced Age >/= 1   Other Active Problems    Hospital day # 1  Neurology will sign off. Please call with questions. Pt will follow up with stroke clinic NP at [facility] in about 4 weeks. Thanks for the consult.  Rosalin Hawking, MD PhD Stroke Neurology 09/12/2020 2:45 PM  To contact Stroke Continuity provider, please refer to  [email or web address] After hours, contact General Neurology

## 2020-09-12 NOTE — ED Notes (Signed)
Tele  Breakfast Ordered 

## 2020-09-12 NOTE — Care Management CC44 (Signed)
Condition Code 44 Documentation Completed  Patient Details  Name: James Ramsey MRN: [id number] Date of Birth: Dec 17, 1940   Condition Code 44 given:  Yes Patient signature on Condition Code 44 notice:  Yes Documentation of 2 MD's agreement:  Yes Code 44 added to claim:  Yes    Laurena Slimmer, RN 09/12/2020, 4:58 PM

## 2020-09-12 NOTE — Discharge Summary (Signed)
Physician Discharge Summary  James Ramsey [id number] DOB: 1941/02/19 DOA: 09/11/2020  PCP: Aretta Nip, MD  Admit date: 09/11/2020 Discharge date: 09/12/2020  Admitted From: Home  Disposition:  Home   Recommendations for Outpatient Follow-up:  1. Follow up with PCP Dr. Radene Ou in 1 week 2. Follow up with Neurology in 4-6 weeks 3. Dr. Radene Ou: Please check cholesterol in 3 months, other statin surveillance at your discretion     Home Health: None  Equipment/Devices: None new  Discharge Condition: Good  CODE STATUS: FULL Diet recommendation: Cardiac   Brief Narrative:  James Ramsey is a 80 y.o. M with no significant PMHx who presented with several days visual field deficits.  In the ER, visual fields normal to Neurological exam, but MRI showed several punctate L PCA infarcts.        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  Evaluated by neurology who felt stroke likely thrombus from PCA stenosis, less likely atherosclerotic given absence of other disease.  Echo normal.    Started aspirin 325 and Plavix for 3 months then apsirin 325 alone Follow up with Neuro arranged.  Started on atorvastatin.   Given possible embolic source, ILR arranged, will follow up with Electrophysiology as an outpatient.  tPA not given because outside window.  Dysphagia screen ordered in ER.  PT eval ordered: recommended outpatient PT.   Nonsmoker.           Discharge Instructions  Discharge Instructions    Ambulatory referral to Neurology   Complete by: As directed    Follow up with stroke clinic NP (Jessica Vanschaick or Cecille Rubin, if both not available, consider Zachery Dauer, or Ahern) at [facility] in about 4 weeks. Thanks.   Diet - low sodium heart  healthy   Complete by: As directed    Discharge instructions   Complete by: As directed    From Dr. Loleta Books: You were admitted for a small stroke Thankfully, the deficits are mild.  To prevent a future stroke, you should start a cholesterol medicine and anti-platelet medicines, both of which have been shown to reduce the reoccurence of stroke in patient's like you.  Sprecifically: Take atorvastatin/Lipitor 40 mg nightly Go see your primary care doctor, she may want to perform lab testing as surveillance to see how the cholesterol medicine is working  Also take the two antiplatelet medicines aspirin and clopidogrel: Take aspirin 325 mg once daily and clopidogrel/Plavix 75 mg daily If you have rectal bleeding, call your primary care doctor immediately After three months, stop the Plavix and take just aspirin 325 mg alone  Go see the stroke specialist at [facility] in 4-6 weeks This referral has been made, their number is below  Go see your primary care doctor in 1 week  Our therapist Rod Holler recommended physical therapy as an outpatient.  This referral was made by [facility], and you should have their contact information below in the To Do section.   Increase activity slowly   Complete by: As directed      Allergies as of 09/12/2020   No Known Allergies     Medication List    TAKE these medications   aspirin 325 MG EC tablet Take 1 tablet (325 mg total) by mouth daily. Start taking on: September 13, 2020   atorvastatin 40  MG tablet Commonly known as: LIPITOR Take 1 tablet (40 mg total) by mouth daily. Start taking on: September 13, 2020   Calcium 600+D 600-200 MG-UNIT Tabs Generic drug: Calcium Carbonate-Vitamin D Take 1 tablet by mouth See admin instructions. Takes twice a week  Wednesday and sunday   clopidogrel 75 MG tablet Commonly known as: PLAVIX Take 1 tablet (75 mg total) by mouth daily. Start taking on: September 13, 2020   ibuprofen 200 MG  tablet Commonly known as: ADVIL Take 200-400 mg by mouth every 6 (six) hours as needed for headache or moderate pain.       Follow-up Information    [facility]. Schedule an appointment as soon as possible for a visit in 4 week(s).   Specialty: Neurology Contact information: [address] [place] [phone]       [facility] Follow up.   Specialty: Rehabilitation Why: Call tomorrow to set up initial appointment.  Contact information: [address] [place], [address] [place] [id number] [place] [phone]       Rankins, Bill Salinas, MD. Schedule an appointment as soon as possible for a visit in 1 week(s).   Specialty: Family Medicine Contact information: [place] [address] [phone]              No Known Allergies  Consultations:  Neurology   Procedures/Studies: CT ANGIO HEAD W OR WO CONTRAST  Result Date: 09/12/2020 CLINICAL DATA:  Right eye vision changes for 2 days. EXAM: CT ANGIOGRAPHY HEAD AND NECK TECHNIQUE: Multidetector CT imaging of the head and neck was performed using the standard protocol during bolus administration of intravenous contrast. Multiplanar CT image reconstructions and MIPs were obtained to evaluate the vascular anatomy. Carotid stenosis measurements (when applicable) are obtained utilizing NASCET criteria, using the distal internal carotid diameter as the denominator. CONTRAST:  73mL OMNIPAQUE IOHEXOL 350 MG/ML SOLN COMPARISON:  None. FINDINGS: CTA NECK FINDINGS SKELETON: There is no bony spinal canal stenosis. No lytic or blastic lesion. OTHER NECK: Normal pharynx, larynx and major salivary glands. No cervical lymphadenopathy. Unremarkable thyroid gland. UPPER CHEST: No pneumothorax or pleural effusion. No nodules or masses. AORTIC ARCH: There is no calcific atherosclerosis of the aortic arch. There is no  aneurysm, dissection or hemodynamically significant stenosis of the visualized portion of the aorta. Conventional 3 vessel aortic branching pattern. The visualized proximal subclavian arteries are widely patent. RIGHT CAROTID SYSTEM: Normal without aneurysm, dissection or stenosis. LEFT CAROTID SYSTEM: Normal without aneurysm, dissection or stenosis. VERTEBRAL ARTERIES: Left dominant configuration. Both origins are clearly patent. There is no dissection, occlusion or flow-limiting stenosis to the skull base (V1-V3 segments). CTA HEAD FINDINGS POSTERIOR CIRCULATION: --Vertebral arteries: Right vertebral artery terminates in PICA --Inferior cerebellar arteries: Normal. --Basilar artery: Normal. --Superior cerebellar arteries: Normal. --Posterior cerebral arteries (PCA): Normal. ANTERIOR CIRCULATION: --Intracranial internal carotid arteries: Normal. --Anterior cerebral arteries (ACA): Normal. Absent left A1 segment, normal variant --Middle cerebral arteries (MCA): Normal. VENOUS SINUSES: As permitted by contrast timing, patent. ANATOMIC VARIANTS: None Review of the MIP images confirms the above findings. IMPRESSION: Normal CTA of the head and neck. Electronically Signed   By: Ulyses Jarred M.D.   On: 09/12/2020 01:39   DG Chest 2 View  Result Date: 09/11/2020 CLINICAL DATA:  Acute ischemic stroke. EXAM: CHEST - 2 VIEW COMPARISON:  None. FINDINGS: Mild eventration of left hemidiaphragm. The heart is normal in size. Normal mediastinal contours. Subsegmental opacities at both lung  bases. No pleural fluid or pneumothorax. No pulmonary edema. Thoracic spondylosis. No acute osseous abnormalities are seen IMPRESSION: Subsegmental bibasilar atelectasis. Electronically Signed   By: Keith Rake M.D.   On: 09/11/2020 23:33   CT HEAD WO CONTRAST  Result Date: 09/11/2020 CLINICAL DATA:  Acute neurologic deficit, possible stroke. Visual changes in the right eye. EXAM: CT HEAD WITHOUT CONTRAST TECHNIQUE: Contiguous axial  images were obtained from the base of the skull through the vertex without intravenous contrast. COMPARISON:  None FINDINGS: Brain: Periventricular white matter and corona radiata hypodensities favor chronic ischemic microvascular white matter disease, mildly confluent along the anterior limb of the left internal capsule for example on image 12 series 4, probably from confluent chronic ischemic microvascular white matter disease. No specific findings of acute CVA, mass lesion, or intracranial hemorrhage. The brainstem, cerebellum, cerebral peduncles, thalami, and basal ganglia appear unremarkable as does the ventricular system. Vascular: Unremarkable Skull: Unremarkable Sinuses/Orbits: Unremarkable. Other: No supplemental non-categorized findings. IMPRESSION: 1. No acute intracranial findings. 2. Periventricular white matter and corona radiata hypodensities favor chronic ischemic microvascular white matter disease, mildly confluent along the anterior limb of the left internal capsule. Electronically Signed   By: Van Clines M.D.   On: 09/11/2020 17:35   CT ANGIO NECK W OR WO CONTRAST  Result Date: 09/12/2020 CLINICAL DATA:  Right eye vision changes for 2 days. EXAM: CT ANGIOGRAPHY HEAD AND NECK TECHNIQUE: Multidetector CT imaging of the head and neck was performed using the standard protocol during bolus administration of intravenous contrast. Multiplanar CT image reconstructions and MIPs were obtained to evaluate the vascular anatomy. Carotid stenosis measurements (when applicable) are obtained utilizing NASCET criteria, using the distal internal carotid diameter as the denominator. CONTRAST:  58mL OMNIPAQUE IOHEXOL 350 MG/ML SOLN COMPARISON:  None. FINDINGS: CTA NECK FINDINGS SKELETON: There is no bony spinal canal stenosis. No lytic or blastic lesion. OTHER NECK: Normal pharynx, larynx and major salivary glands. No cervical lymphadenopathy. Unremarkable thyroid gland. UPPER CHEST: No pneumothorax or  pleural effusion. No nodules or masses. AORTIC ARCH: There is no calcific atherosclerosis of the aortic arch. There is no aneurysm, dissection or hemodynamically significant stenosis of the visualized portion of the aorta. Conventional 3 vessel aortic branching pattern. The visualized proximal subclavian arteries are widely patent. RIGHT CAROTID SYSTEM: Normal without aneurysm, dissection or stenosis. LEFT CAROTID SYSTEM: Normal without aneurysm, dissection or stenosis. VERTEBRAL ARTERIES: Left dominant configuration. Both origins are clearly patent. There is no dissection, occlusion or flow-limiting stenosis to the skull base (V1-V3 segments). CTA HEAD FINDINGS POSTERIOR CIRCULATION: --Vertebral arteries: Right vertebral artery terminates in PICA --Inferior cerebellar arteries: Normal. --Basilar artery: Normal. --Superior cerebellar arteries: Normal. --Posterior cerebral arteries (PCA): Normal. ANTERIOR CIRCULATION: --Intracranial internal carotid arteries: Normal. --Anterior cerebral arteries (ACA): Normal. Absent left A1 segment, normal variant --Middle cerebral arteries (MCA): Normal. VENOUS SINUSES: As permitted by contrast timing, patent. ANATOMIC VARIANTS: None Review of the MIP images confirms the above findings. IMPRESSION: Normal CTA of the head and neck. Electronically Signed   By: Ulyses Jarred M.D.   On: 09/12/2020 01:39   MR Brain W and Wo Contrast  Result Date: 09/11/2020 CLINICAL DATA:  Right eye visual changes for 2 days. Possible stroke. EXAM: MRI HEAD WITHOUT AND WITH CONTRAST TECHNIQUE: Multiplanar, multiecho pulse sequences of the brain and surrounding structures were obtained without and with intravenous contrast. CONTRAST:  9.84mL GADAVIST GADOBUTROL 1 MMOL/ML IV SOLN COMPARISON:  Head CT 09/11/2020 FINDINGS: Brain: There are small foci of trace diffusion weighted  hyperintensity and mild T2 FLAIR hyperintensity involving left occipital cortex with normal ADC and subtle enhancement favoring  early subacute infarcts with this history. A punctate early subacute infarct is also suspected in the mesial left temporal lobe. Scattered small foci of T2 hyperintensity in the cerebral white matter bilaterally are nonspecific but compatible with mild chronic small vessel ischemic disease. No intracranial hemorrhage, mass, midline shift, or extra-axial fluid collection is identified there is mild generalized cerebral atrophy. Vascular: Major intracranial vascular flow voids are preserved with the right vertebral artery being hypoplastic. Skull and upper cervical spine: Unremarkable bone marrow signal. Sinuses/Orbits: Unremarkable orbits. Mild mucosal thickening in the paranasal sinuses. Clear mastoid air cells. Other: None. IMPRESSION: 1. 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 2. Mild chronic small vessel ischemic disease. Electronically Signed   By: Logan Bores M.D.   On: 09/11/2020 21:46   ECHOCARDIOGRAM COMPLETE  Result Date: 09/12/2020    ECHOCARDIOGRAM REPORT   Patient Name:   James Ramsey [facility] Date of Exam: 09/12/2020 Medical Rec #:  [id number]               Height:       71.0 in Accession #:    [id number]              Weight:       200.0 lb Date of Birth:  1941/05/16               BSA:          2.109 m Patient Age:    80 years                BP:           151/74 mmHg Patient Gender: M                       HR:           56 bpm. Exam Location:  Inpatient Procedure: 2D Echo Indications:    stroke 434.91  History:        Patient has no prior history of Echocardiogram examinations.  Sonographer:    Johny Chess Referring Phys: [place]  1. Left ventricular ejection fraction, by estimation, is 65 to 70%. The left ventricle has normal function. The left ventricle has no regional wall motion abnormalities. There is mild left ventricular hypertrophy. Left ventricular diastolic parameters are consistent with Grade I diastolic dysfunction (impaired  relaxation).  2. Right ventricular systolic function is normal. The right ventricular size is normal. Tricuspid regurgitation signal is inadequate for assessing PA pressure.  3. The mitral valve is normal in structure. Trivial mitral valve regurgitation. No evidence of mitral stenosis.  4. The aortic valve is tricuspid. Aortic valve regurgitation is not visualized. Mild aortic valve sclerosis is present, with no evidence of aortic valve stenosis.  5. The inferior vena cava is normal in size with greater than 50% respiratory variability, suggesting right atrial pressure of 3 mmHg. FINDINGS  Left Ventricle: Left ventricular ejection fraction, by estimation, is 65 to 70%. The left ventricle has normal function. The left ventricle has no regional wall motion abnormalities. The left ventricular internal cavity size was normal in size. There is  mild left ventricular hypertrophy. Left ventricular diastolic parameters are consistent with Grade I diastolic dysfunction (impaired relaxation). Right Ventricle: The right ventricular size is normal. No increase in right ventricular wall thickness. Right ventricular systolic function is normal. Tricuspid  regurgitation signal is inadequate for assessing PA pressure. Left Atrium: Left atrial size was normal in size. Right Atrium: Right atrial size was normal in size. Pericardium: There is no evidence of pericardial effusion. Mitral Valve: The mitral valve is normal in structure. Trivial mitral valve regurgitation. No evidence of mitral valve stenosis. Tricuspid Valve: The tricuspid valve is normal in structure. Tricuspid valve regurgitation is not demonstrated. Aortic Valve: The aortic valve is tricuspid. Aortic valve regurgitation is not visualized. Mild aortic valve sclerosis is present, with no evidence of aortic valve stenosis. Pulmonic Valve: The pulmonic valve was normal in structure. Pulmonic valve regurgitation is trivial. Aorta: The aortic root is normal in size and  structure. Venous: The inferior vena cava is normal in size with greater than 50% respiratory variability, suggesting right atrial pressure of 3 mmHg. IAS/Shunts: No atrial level shunt detected by color flow Doppler.  LEFT VENTRICLE PLAX 2D LVIDd:         4.60 cm  Diastology LVIDs:         3.20 cm  LV e' medial:    6.85 cm/s LV PW:         1.10 cm  LV E/e' medial:  12.6 LV IVS:        1.10 cm  LV e' lateral:   10.20 cm/s LVOT diam:     2.00 cm  LV E/e' lateral: 8.5 LV SV:         85 LV SV Index:   40 LVOT Area:     3.14 cm  RIGHT VENTRICLE             IVC RV S prime:     15.60 cm/s  IVC diam: 1.90 cm TAPSE (M-mode): 2.9 cm LEFT ATRIUM             Index       RIGHT ATRIUM           Index LA diam:        3.70 cm 1.75 cm/m  RA Area:     17.60 cm LA Vol (A2C):   41.4 ml 19.63 ml/m RA Volume:   46.00 ml  21.82 ml/m LA Vol (A4C):   45.6 ml 21.63 ml/m LA Biplane Vol: 46.9 ml 22.24 ml/m  AORTIC VALVE LVOT Vmax:   129.00 cm/s LVOT Vmean:  83.200 cm/s LVOT VTI:    0.269 m  AORTA Ao Root diam: 3.00 cm Ao Asc diam:  3.40 cm MITRAL VALVE MV Area (PHT): 4.15 cm    SHUNTS MV Decel Time: 183 msec    Systemic VTI:  0.27 m MV E velocity: 86.50 cm/s  Systemic Diam: 2.00 cm MV A velocity: 86.10 cm/s MV E/A ratio:  1.00 Loralie Champagne MD Electronically signed by Loralie Champagne MD Signature Date/Time: 09/12/2020/5:34:32 PM    Final    VAS [place] LOWER EXTREMITY VENOUS (DVT)  Result Date: 09/12/2020  Lower Venous DVT Study Other Indications: Embolic CVA. Comparison Study: No prior exams Performing Technologist: Rogelia Rohrer  Examination Guidelines: A complete evaluation includes B-mode imaging, spectral Doppler, color Doppler, and power Doppler as needed of all accessible portions of each vessel. Bilateral testing is considered an integral part of a complete examination. Limited examinations for reoccurring indications may be performed as noted. The reflux portion of the exam is performed with the patient in reverse Trendelenburg.   +---------+---------------+---------+-----------+----------+--------------+ RIGHT    CompressibilityPhasicitySpontaneityPropertiesThrombus Aging +---------+---------------+---------+-----------+----------+--------------+ CFV      Full  Yes      Yes                                 +---------+---------------+---------+-----------+----------+--------------+ SFJ      Full                                                        +---------+---------------+---------+-----------+----------+--------------+ FV Prox  Full           Yes      Yes                                 +---------+---------------+---------+-----------+----------+--------------+ FV Mid   Full           Yes      Yes                                 +---------+---------------+---------+-----------+----------+--------------+ FV DistalFull           Yes      Yes                                 +---------+---------------+---------+-----------+----------+--------------+ PFV      Full                                                        +---------+---------------+---------+-----------+----------+--------------+ POP      Full           Yes      Yes                                 +---------+---------------+---------+-----------+----------+--------------+ PTV      Full                                                        +---------+---------------+---------+-----------+----------+--------------+ PERO     Full                                                        +---------+---------------+---------+-----------+----------+--------------+   +---------+---------------+---------+-----------+----------+--------------+ LEFT     CompressibilityPhasicitySpontaneityPropertiesThrombus Aging +---------+---------------+---------+-----------+----------+--------------+ CFV      Full           Yes      Yes                                  +---------+---------------+---------+-----------+----------+--------------+ SFJ      Full                                                        +---------+---------------+---------+-----------+----------+--------------+  FV Prox  Full           Yes      Yes                                 +---------+---------------+---------+-----------+----------+--------------+ FV Mid   Full           Yes      Yes                                 +---------+---------------+---------+-----------+----------+--------------+ FV DistalFull           Yes      Yes                                 +---------+---------------+---------+-----------+----------+--------------+ PFV      Full                                                        +---------+---------------+---------+-----------+----------+--------------+ POP      Full           Yes      Yes                                 +---------+---------------+---------+-----------+----------+--------------+ PTV      Full                                                        +---------+---------------+---------+-----------+----------+--------------+ PERO     Full                                                        +---------+---------------+---------+-----------+----------+--------------+     Summary: BILATERAL: - No evidence of deep vein thrombosis seen in the lower extremities, bilaterally. - RIGHT: - No cystic structure found in the popliteal fossa.  LEFT: - No cystic structure found in the popliteal fossa.  *See table(s) above for measurements and observations. Electronically signed by Deitra Mayo MD on 09/12/2020 at 6:26:59 PM.    Final        Subjective: Feeling well.  No focal weakness, no confusion.  No fever.  Discharge Exam: Vitals:   09/12/20 1745 09/12/20 1800  BP:  138/77  Pulse: (!) 58 (!) 58  Resp: 19 17  Temp:    SpO2: 98% 98%   Vitals:   09/12/20 1645 09/12/20 1715 09/12/20 1745 09/12/20  1800  BP:  138/78  138/77  Pulse: 71 (!) 58 (!) 58 (!) 58  Resp: 17 15 19 17   Temp:      TempSrc:      SpO2: 98% 98% 98% 98%  Weight:      Height:        General: Pt is alert, awake, not in acute distress Cardiovascular: RRR,  nl S1-S2, no murmurs appreciated.   No LE edema.   Respiratory: Normal respiratory rate and rhythm.  CTAB without rales or wheezes. Abdominal: Abdomen soft and non-tender.  No distension or HSM.   Neuro/Psych: Strength symmetric in upper and lower extremities.  Judgment and insight appear normal.   The results of significant diagnostics from this hospitalization (including imaging, microbiology, ancillary and laboratory) are listed below for reference.     Microbiology: Recent Results (from the past 240 hour(s))  SARS CORONAVIRUS 2 (TAT 6-24 HRS) Nasopharyngeal Nasopharyngeal Swab     Status: None   Collection Time: 09/11/20 10:32 PM   Specimen: Nasopharyngeal Swab  Result Value Ref Range Status   SARS Coronavirus 2 NEGATIVE NEGATIVE Final    Comment: (NOTE) SARS-CoV-2 target nucleic acids are NOT DETECTED.  The SARS-CoV-2 RNA is generally detectable in upper and lower respiratory specimens during the acute phase of infection. Negative results do not preclude SARS-CoV-2 infection, do not rule out co-infections with other pathogens, and should not be used as the sole basis for treatment or other patient management decisions. Negative results must be combined with clinical observations, patient history, and epidemiological information. The expected result is Negative.  Fact Sheet for Patients: SugarRoll.be  Fact Sheet for Healthcare Providers: https://www.woods-mathews.com/  This test is not yet approved or cleared by the [place] FDA and  has been authorized for detection and/or diagnosis of SARS-CoV-2 by FDA under an Emergency Use Authorization (EUA). This EUA will remain  in effect (meaning this test  can be used) for the duration of the COVID-19 declaration under Se ction 564(b)(1) of the Act, 21 U.S.C. section 360bbb-3(b)(1), unless the authorization is terminated or revoked sooner.  Performed at [facility], [place] [address] [place], [place] [address]      Labs: BNP (last 3 results) No results for input(s): BNP in the last 8760 hours. Basic Metabolic Panel: Recent Labs  Lab 09/11/20 1525  NA 138  K 4.0  CL 102  CO2 24  GLUCOSE 95  BUN 18  CREATININE 1.07  CALCIUM 9.8   Liver Function Tests: Recent Labs  Lab 09/11/20 1525  AST 30  ALT 23  ALKPHOS 60  BILITOT 1.1  PROT 8.2*  ALBUMIN 4.7   No results for input(s): LIPASE, AMYLASE in the last 168 hours. No results for input(s): AMMONIA in the last 168 hours. CBC: Recent Labs  Lab 09/11/20 1525  WBC 10.2  NEUTROABS 7.7  HGB 16.6  HCT 47.3  MCV 91.5  PLT 219   Cardiac Enzymes: No results for input(s): CKTOTAL, CKMB, CKMBINDEX, TROPONINI in the last 168 hours. BNP: Invalid input(s): POCBNP CBG: No results for input(s): GLUCAP in the last 168 hours. D-Dimer No results for input(s): DDIMER in the last 72 hours. Hgb A1c Recent Labs    09/12/20 0334  HGBA1C 5.1   Lipid Profile Recent Labs    09/12/20 0334  CHOL 186  HDL 44  LDLCALC 128*  TRIG 69  CHOLHDL 4.2   Thyroid function studies No results for input(s): TSH, T4TOTAL, T3FREE, THYROIDAB in the last 72 hours.  Invalid input(s): FREET3 Anemia work up No results for input(s): VITAMINB12, FOLATE, FERRITIN, TIBC, IRON, RETICCTPCT in the last 72 hours. Urinalysis    Component Value Date/Time   COLORURINE YELLOW 09/12/2020 [place] 09/12/2020 0655   LABSPEC >1.046 (H) 09/12/2020 0655   PHURINE 5.0 09/12/2020 [place] 09/12/2020 0655   HGBUR NEGATIVE 09/12/2020 9811  BILIRUBINUR NEGATIVE 09/12/2020 [place] 09/12/2020 [place] 09/12/2020 0655   NITRITE NEGATIVE  09/12/2020 [place] 09/12/2020 0655   Sepsis Labs Invalid input(s): PROCALCITONIN,  WBC,  LACTICIDVEN Microbiology Recent Results (from the past 240 hour(s))  SARS CORONAVIRUS 2 (TAT 6-24 HRS) Nasopharyngeal Nasopharyngeal Swab     Status: None   Collection Time: 09/11/20 10:32 PM   Specimen: Nasopharyngeal Swab  Result Value Ref Range Status   SARS Coronavirus 2 NEGATIVE NEGATIVE Final    Comment: (NOTE) SARS-CoV-2 target nucleic acids are NOT DETECTED.  The SARS-CoV-2 RNA is generally detectable in upper and lower respiratory specimens during the acute phase of infection. Negative results do not preclude SARS-CoV-2 infection, do not rule out co-infections with other pathogens, and should not be used as the sole basis for treatment or other patient management decisions. Negative results must be combined with clinical observations, patient history, and epidemiological information. The expected result is Negative.  Fact Sheet for Patients: SugarRoll.be  Fact Sheet for Healthcare Providers: https://www.woods-mathews.com/  This test is not yet approved or cleared by the [place] FDA and  has been authorized for detection and/or diagnosis of SARS-CoV-2 by FDA under an Emergency Use Authorization (EUA). This EUA will remain  in effect (meaning this test can be used) for the duration of the COVID-19 declaration under Se ction 564(b)(1) of the Act, 21 U.S.C. section 360bbb-3(b)(1), unless the authorization is terminated or revoked sooner.  Performed at [facility], [place] [address] [place], [place] [address]      Time coordinating discharge: 25 minutes      SIGNED:   Edwin Dada, MD  [facility] 09/12/2020, 6:37 PM

## 2020-09-12 NOTE — Care Management (Signed)
   09/12/20 1803  TOC ED Mini Assessment  TOC Time spent with patient (minutes): 25  PING Used in TOC Assessment No  Admission or Readmission Diverted Yes  What brought you to the Emergency Department?  r/o stroke symptoms  Barrier interventions ED CM met with patient and spouse to discuss recommendations for OP PT services patient and wife are agreeable.  Patient resides in [place] discussed [place] patient is qgreeable and prefers the [facility], Referral faxed to [place] office [phone]. Patient made aware that the scheduler should contact them at number listed in record to arrange appointment. Verbalized understanding.  No further TOC needs identified

## 2020-09-12 NOTE — ED Notes (Signed)
Patient verbalizes understanding of discharge instructions. Opportunity for questioning and answers were provided. Pt discharged from ED. 

## 2020-09-12 NOTE — Progress Notes (Signed)
Pt was seen for mobility and note his higher level balance skills with tandem standing and narrow base standing have mild instability.  He is noting these as well, but is not outright losing balance.  He is tired from poor sleep, has recent injury to R ankle, both of which are possibly impacting his balance skills.  Follow up with outpatient therapy to work on balance, part of which can include proprioceptive training on R ankle as well.  Follow up as inpt is not necessary, will continue in next venue of care.  09/12/20 1400  PT Visit Information  Last PT Received On 09/12/20  Assistance Needed +1  History of Present Illness 80 yo male with onset of new early small L temporal and occipital infarcts was brought to ED after having R visual field yellow and blue coloration of lacunae.  He has also been having BP elevations with higher values on admission.  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  Pain Assessment  Pain Assessment No/denies pain  Cognition  Arousal/Alertness Awake/alert  Behavior During Therapy WFL for tasks assessed/performed  Overall Cognitive Status Within Functional Limits for tasks assessed  Upper Extremity Assessment  Upper Extremity Assessment Overall WFL for tasks assessed  Lower Extremity Assessment  Lower Extremity Assessment Overall WFL for tasks assessed  Cervical / Trunk Assessment   Cervical / Trunk Assessment Normal  Bed Mobility  Overal bed mobility Modified Independent  General bed mobility comments able to get up with no assistance  Transfers  Overall transfer level Modified independent  Equipment used None  Ambulation/Gait  Ambulation/Gait assistance Supervision  Gait Distance (Feet) 150 Feet  Assistive device None  Gait Pattern/deviations Step-through pattern;Narrow base of support;Decreased stride length  General Gait Details pt walked in confined hall spaces and in room with no LOB  Gait velocity controlled, norma\l  Gait velocity interpretation <1.31 ft/sec, indicative of household ambulator  Modified Rankin (Stroke Patients Only)  Pre-Morbid Rankin Score 0  Modified Rankin 1  Balance  Overall balance assessment Mild deficits observed, not formally tested  General Comments  General comments (skin integrity, edema, etc.) pt had poor sleep last PM and recent R ankle injury.  Asking for outpatient eval of PT to ensure his minor changes of balance are not due to stroke  Exercises  Exercises Other exercises (LE strength is grossly WFL)  PT - End of Session  Activity Tolerance Patient limited by fatigue;Other (comment);Treatment limited secondary to medical complications (Comment)  Patient left in bed;with call bell/phone within reach;with family/visitor present  Nurse Communication Mobility status  PT Assessment  PT Recommendation/Assessment Patient needs continued PT services  PT Visit Diagnosis Other abnormalities of gait and mobility (R26.89);Unsteadiness on feet (R26.81);History of falling (Z91.81)  PT Problem List Decreased balance;Cardiopulmonary status limiting activity  Barriers to Discharge Comments home with wife  AM-PAC PT "6 Clicks" Mobility Outcome Measure (Version 2)  Help needed turning from your back to your side while in a  flat bed without using bedrails? 4  Help needed moving from lying on your back to sitting on the side of a flat bed  without using bedrails? 3  Help needed moving to and from a bed to a chair (including a wheelchair)? 3  Help needed standing up from a chair using your arms (e.g., wheelchair or bedside chair)? 3  Help needed to walk in hospital room? 3  Help needed climbing 3-5 steps with a railing?  3  6 Click Score 19  Consider Recommendation of Discharge To: Home with [facility]  PT Recommendation  Follow Up Recommendations Outpatient PT  PT equipment None recommended by PT  Individuals Consulted  Consulted and Agree with Results and Recommendations Patient;Family member/caregiver  Family Member Consulted wife  Acute Rehab PT Goals  Patient Stated Goal to get home today  PT Goal Formulation With patient/family  Time For Goal Achievement 09/12/20  Potential to Achieve Goals Good  PT Time Calculation  PT Start Time (ACUTE ONLY) 1355  PT Stop Time (ACUTE ONLY) 1418  PT Time Calculation (min) (ACUTE ONLY) 23 min  PT General Charges  $$ ACUTE PT VISIT 1 Visit  PT Evaluation  $PT Eval Moderate Complexity 1 Mod  PT Treatments  $Gait Training 8-22 mins  Written Expression  Dominant Hand Right    Mee Hives, PT MS Acute Rehab Dept. Number: [place]

## 2020-09-12 NOTE — ED Notes (Signed)
Lunch Tray Ordered @ [id number].

## 2020-09-12 NOTE — Care Management Obs Status (Signed)
[place] NOTIFICATION   Patient Details  Name: James Ramsey MRN: [id number] Date of Birth: [birth year]   Medicare Observation Status Notification Given:  Ernesta Amble, RN 09/12/2020, 4:58 PM

## 2020-09-20 DIAGNOSIS — E7800 Pure hypercholesterolemia, unspecified: Secondary | ICD-10-CM | POA: Diagnosis not present

## 2020-09-20 DIAGNOSIS — I6389 Other cerebral infarction: Secondary | ICD-10-CM | POA: Diagnosis not present

## 2020-09-20 DIAGNOSIS — Z09 Encounter for follow-up examination after completed treatment for conditions other than malignant neoplasm: Secondary | ICD-10-CM | POA: Diagnosis not present

## 2020-09-28 ENCOUNTER — Other Ambulatory Visit: Payer: Self-pay

## 2020-09-28 ENCOUNTER — Ambulatory Visit: Payer: Medicare PPO | Attending: Family Medicine

## 2020-09-28 DIAGNOSIS — R2689 Other abnormalities of gait and mobility: Secondary | ICD-10-CM | POA: Insufficient documentation

## 2020-09-28 NOTE — Therapy (Addendum)
[facility] [address] [place], [place], [address] Phone: [phone]   Fax:  [phone]  Physical Therapy Evaluation  Patient Details  Name: James Ramsey MRN: [id number] Date of Birth: 06/10/1941 No data recorded  Encounter Date: 09/28/2020   PT End of Session - 09/28/20 1532    Visit Number 1    Number of Visits 1    PT Start Time 6962    PT Stop Time 1520    PT Time Calculation (min) 35 min    Equipment Utilized During Treatment Gait belt    Activity Tolerance Patient tolerated treatment well    Behavior During Therapy WFL for tasks assessed/performed           PCP: Aretta Nip, MD Referring [place]ier: Edwin Dada,*   History reviewed. No pertinent past medical history. History reviewed. No pertinent surgical history. Patient Active Problem List   Diagnosis Date Noted  . Acute ischemic stroke ([place]) 09/11/2020    SUBJECTIVE: Pt reports of having visual disturbances that started around 09/09/20. Went to ED on 09/11/20 on recommendation of his PCP. 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 Before his stroke, he fell off the ladder and sustained ankle injury on 08/02/20. Ankle is about 95% better now.   Pain: is patient experiencing pain? No  Precautions: Fall  Weight bearing restrictions: No  Has patient fallen in last 6 months? Yes, Number of falls: 1  Living Environment:  Lives with: places; lives with: lives with their spouse  Lives in: House/apartment  Stairs: Yes; External: 4 steps; Rail on L going up  Has following equipment at home: none  Prior level of function: Independent  Patient Goals: learn to see if I have any balance exercises  OBJECTIVE:   Cognition:  Overall cognitive status: Within functional limits for tasks assessed     MMT Right 09/28/2020 Left 09/28/2020  Hip flexion 5/5 5/5  Hip abduction 5/5 5/5  Knee flexion 5/5 5/5  Knee  extension 5/5 5/5  Ankle dorsiflexion 5/5 5/5  Ankle plantarflexion 5/5 5/5   Transfers: Assistive device utilized: None. Bed mobility: I with all aspects Transfers:    Sit to stand: Complete Independence   Stand to sit: Complete Independence   Chair to chair: Complete Independence   Floor: Complete Independence  Ramp: Complete Independence  Curb: Complete Independence  Gait:  Distance walked: 207'  Assistive device utilized: none   Level of assistance: Complete Independence   Functional tests: 5 times sit to stand: 8.50 sec and Dynamic Gait Index: 24/24   Today's Treatment:  Unilateral heel raises: 10x R and L SLS EC: 10sec R and L Tandem stance: 30 sec EC, R and L   Home Exercise Program: Access Code: [email or web address] URL: [email or web address] Date: 09/28/2020 Prepared by: Markus Jarvis  Exercises Standing Single Leg Heel Raise - 1 x daily - 7 x weekly - 3 sets - 10 reps Single Leg Balance with Eyes Closed - 1 x daily - 7 x weekly - 1 sets - 5 reps - 10 hold Tandem Stance with Eyes Closed - 1 x daily - 7 x weekly - 1 sets - 3 reps - 30 hold   Assessment: Patient is a 80 y.o. male who was seen today for gait and balance disorder after recent acute stroke. Objective impairments include none. These impairments are not  limiting patient from any function or ADLs.. Patient will benefit from skilled PT to address above impairments and  improve overall function.  Rehab potential: Good  Clinical decision making: Stable/uncomplicated  Evaluation complexity: Low   Goals: Goals reviewed with patient? Yes  SHORT TERM and LONG TERM GOALS: no goals established  PLAN: PT frequency: one time visit  PT duration: other: eval only  Planned Interventions: Therapeutic exercises, Therapeutic activity, Neuro Muscular re-education, Balance training, Gait training, Patient/Family education, Joint mobilization and Stair training      Visit Diagnosis: Balance  disorder     Problem List Patient Active Problem List   Diagnosis Date Noted  . Acute ischemic stroke ([place]) 09/11/2020    Kerrie Pleasure , PT 09/28/2020, 3:34 PM  [facility] [address] [place], [place], [address] Phone: [phone]   Fax:  [phone]  Name: James Ramsey MRN: [id number] Date of Birth: 1941-03-02

## 2020-09-29 NOTE — Addendum Note (Signed)
Addended by: Kerrie Pleasure on: 09/29/2020 09:42 AM   Modules accepted: Orders

## 2020-10-02 ENCOUNTER — Encounter: Payer: Self-pay | Admitting: Internal Medicine

## 2020-10-02 ENCOUNTER — Ambulatory Visit: Payer: Medicare PPO | Admitting: Internal Medicine

## 2020-10-02 ENCOUNTER — Other Ambulatory Visit: Payer: Self-pay

## 2020-10-02 VITALS — BP 154/86 | HR 57 | Ht 71.0 in | Wt 206.0 lb

## 2020-10-02 DIAGNOSIS — I639 Cerebral infarction, unspecified: Secondary | ICD-10-CM

## 2020-10-02 NOTE — Patient Instructions (Signed)
Medication Instructions:  Your physician recommends that you continue on your current medications as directed. Please refer to the Current Medication list given to you today.  *If you need a refill on your cardiac medications before your next appointment, please call your pharmacy*   Lab Work: None ordered If you have labs (blood work) drawn today and your tests are completely normal, you will receive your results only by: Marland Kitchen MyChart Message (if you have MyChart) OR . A paper copy in the mail If you have any lab test that is abnormal or we need to change your treatment, we will call you to review the results.   Testing/Procedures: None ordered   Follow-Up: At [facility], you and your health needs are our priority.  As part of our continuing mission to provide you with exceptional heart care, we have created designated Provider Care Teams.  These Care Teams include your primary Cardiologist (physician) and Advanced Practice Providers (APPs -  Physician Assistants and Nurse Practitioners) who all work together to provide you with the care you need, when you need it.  We recommend signing up for the patient portal called "MyChart".  Sign up information is provided on this After Visit Summary.  MyChart is used to connect with patients for Virtual Visits (Telemedicine).  Patients are able to view lab/test results, encounter notes, upcoming appointments, etc.  Non-urgent messages can be sent to your provider as well.   To learn more about what you can do with MyChart, go to [email or web address].    Your next appointment:     Thursday 10/12/20 with Dr. Rayann Heman for [facility] implant

## 2020-10-02 NOTE — Progress Notes (Signed)
Electrophysiology Office Note   Date:  10/02/2020   ID:  James Ramsey, James Ramsey Aug 12, 1941, MRN [id number]  PCP:  Aretta Nip, MD    Primary Electrophysiologist: Thompson Grayer, MD    CC: stroke   History of Present Illness: James Ramsey is a 80 y.o. male who presents today for electrophysiology evaluation.   She is referred by Dr Erlinda Hong for EP consultation regarding atrial arrhythmias as a possible cause of stroke. The patient presented recently with visual changes and confusion.  He was evaluated by Dr Erlinda Hong and found to have punctate multifocal strokes in the L PCA territory due to L PCA segmental stenosis, likely thrombus, less likely atherosclerosis.  He was started on ASA and plavix. Echo was unrevealing.  He was observed on telemetry monitoring with no arrhythmias observed during his hospitalization.  Today, he denies symptoms of palpitations, chest pain, shortness of breath, orthopnea, PND, lower extremity edema, claudication, dizziness, presyncope, syncope, bleeding, or neurologic sequela. The patient is tolerating medications without difficulties and is otherwise without complaint today.    Past Medical History:  Diagnosis Date  . Stroke (cerebrum) ([place])    No past surgical history on file.   Current Outpatient Medications  Medication Sig Dispense Refill  . aspirin EC 325 MG EC tablet Take 1 tablet (325 mg total) by mouth daily. 30 tablet 0  . atorvastatin (LIPITOR) 40 MG tablet Take 1 tablet (40 mg total) by mouth daily. 30 tablet 11  . Calcium Carbonate-Vitamin D 600-200 MG-UNIT TABS Take 1 tablet by mouth See admin instructions. Takes twice a week  Wednesday and sunday    . clopidogrel (PLAVIX) 75 MG tablet Take 1 tablet (75 mg total) by mouth daily. 30 tablet 11  . ibuprofen (ADVIL) 200 MG tablet Take 200-400 mg by mouth every 6 (six) hours as needed for headache or moderate pain.     No current facility-administered medications for this visit.     Allergies:   Patient has no known allergies.   Social History:  The patient  reports that he has never smoked. He has never used smokeless tobacco. He reports current alcohol use. He reports that he does not use drugs.   Family History:  The patient's  family history includes Breast cancer in his mother; CAD in his father; High Cholesterol in his father; Valvular heart disease in his father.    ROS:  Please see the history of present illness.   All other systems are personally reviewed and negative.    PHYSICAL EXAM: VS:  BP (!) 154/86   Pulse (!) 57   Ht 5\' 11"  (1.803 m)   Wt 206 lb (93.4 kg)   SpO2 98%   BMI 28.73 kg/m  , BMI Body mass index is 28.73 kg/m. GEN: Well nourished, well developed, in no acute distress HEENT: normal Neck: no JVD  Cardiac: RRR  Respiratory:   normal work of breathing GI: soft  MS: no deformity or atrophy Skin: warm and dry  Neuro:  Strength and sensation are intact Psych: euthymic mood, full affect  EKG:  EKG is ordered today. The ekg ordered today is personally reviewed and shows sinus rhythm   Recent Labs: 09/11/2020: ALT 23; BUN 18; Creatinine, Ser 1.07; Hemoglobin 16.6; Platelets 219; Potassium 4.0; Sodium 138  personally reviewed   Lipid Panel     Component Value Date/Time   CHOL 186 09/12/2020 0334   TRIG 69 09/12/2020 0334   HDL 44 09/12/2020 0334  CHOLHDL 4.2 09/12/2020 0334   VLDL 14 09/12/2020 0334   LDLCALC 128 (H) 09/12/2020 0334   personally reviewed   Wt Readings from Last 3 Encounters:  10/02/20 206 lb (93.4 kg)  09/11/20 200 lb (90.7 kg)      Other studies personally reviewed: Additional studies/ records that were reviewed today include: Dr Ericka Pontiff notes, prior ekgs in epic, prior echo and BLE dopplers  Review of the above records today demonstrates: as above  Assessment and Plan:  1. Cryptogenic stroke The patient presents with cryptogenic stroke.   I spoke at length with the patient about monitoring for  afib with either a 30 day event monitor or an implantable loop recorder.  Risks, benefits, and alteratives to implantable loop recorder were discussed with the patient today.   At this time, the patient is very clear in their decision to proceed with implantable loop recorder. We will await insurance authorization and then proceed.   Thompson Grayer MD, [place] 10/02/2020 1:22 PM

## 2020-10-03 ENCOUNTER — Telehealth: Payer: Self-pay | Admitting: Internal Medicine

## 2020-10-03 DIAGNOSIS — I639 Cerebral infarction, unspecified: Secondary | ICD-10-CM

## 2020-10-03 NOTE — Telephone Encounter (Signed)
James Ramsey from [employer] calling on behalf of James Ramsey states they need to discuss the medical necessity of the loop recorder before approval. She states this is due to it being labeled a stat case, so it has a short turn around time. She states the loop recorder code is: 33285. She states since it is stat they will need a call back in no later than 3 hours.

## 2020-10-03 NOTE — Telephone Encounter (Signed)
I spoke with Dr Marlowe Alt at [phone] regarding prior-authorization for ILR. We discussed benefits of ILR to this patient. Unfortunately, he and insurance require 30 day MCOT prior to ILR.  He will send authorization for the [place].   If no arrhythmias are seen on the MCOT 30 day monitor then they may be willing to reconsider ILR at that time.   Otila Kluver,  Pleas alert the patient and order 30 day monitor.  Thanks! Thompson Grayer MD, [place] 10/03/2020 11:55 AM

## 2020-10-04 DIAGNOSIS — L905 Scar conditions and fibrosis of skin: Secondary | ICD-10-CM | POA: Diagnosis not present

## 2020-10-04 DIAGNOSIS — L570 Actinic keratosis: Secondary | ICD-10-CM | POA: Diagnosis not present

## 2020-10-04 DIAGNOSIS — Z85828 Personal history of other malignant neoplasm of skin: Secondary | ICD-10-CM | POA: Diagnosis not present

## 2020-10-04 DIAGNOSIS — D485 Neoplasm of uncertain behavior of skin: Secondary | ICD-10-CM | POA: Diagnosis not present

## 2020-10-04 DIAGNOSIS — C44319 Basal cell carcinoma of skin of other parts of face: Secondary | ICD-10-CM | POA: Diagnosis not present

## 2020-10-05 ENCOUNTER — Encounter: Payer: Self-pay | Admitting: *Deleted

## 2020-10-05 NOTE — Telephone Encounter (Signed)
Patient returning call.

## 2020-10-05 NOTE — Progress Notes (Signed)
Patient ID: James Ramsey, male   DOB: May 25, 1941, 80 y.o.   MRN: [id number] Patient enrolled for [employer] to ship a 30 day cardiac event monitor to his home.

## 2020-10-05 NOTE — Telephone Encounter (Signed)
Discussed need for 30 day monitor before the loop implant will be approved. Cancelled upcoming appointment to accommodate this need and rescheduled 2 months out. Sent patient instructions over mychart and to call with any question.   Patient agreeable and verbalized understanding.

## 2020-10-05 NOTE — Addendum Note (Signed)
Addended by: Darrell Jewel on: 10/05/2020 01:30 PM   Modules accepted: Orders

## 2020-10-05 NOTE — Telephone Encounter (Signed)
Left message to call back  

## 2020-10-06 ENCOUNTER — Encounter: Payer: Self-pay | Admitting: Adult Health

## 2020-10-10 ENCOUNTER — Encounter: Payer: Self-pay | Admitting: Adult Health

## 2020-10-10 ENCOUNTER — Ambulatory Visit: Payer: Medicare PPO | Admitting: Adult Health

## 2020-10-10 VITALS — BP 126/75 | HR 68 | Ht 71.0 in | Wt 209.2 lb

## 2020-10-10 DIAGNOSIS — I63432 Cerebral infarction due to embolism of left posterior cerebral artery: Secondary | ICD-10-CM | POA: Diagnosis not present

## 2020-10-10 MED ORDER — ASPIRIN EC 81 MG PO TBEC: 81.0000 mg | DELAYED_RELEASE_TABLET | Freq: Every day | ORAL | 11 refills | Status: DC

## 2020-10-10 NOTE — Progress Notes (Signed)
[facility] [address] [place]. [place] [address] [phone]       HOSPITAL FOLLOW UP NOTE  [name] Date of Birth:  31-Jan-1941 Medical Record Number:  [id number]   Reason for Referral:  hospital stroke follow up    SUBJECTIVE:   CHIEF COMPLAINT:  Chief Complaint  Patient presents with  . Follow-up    Patient here for a hospital follow up. Patient reports that he is doing well.     HPI:   [name] is a 80 y.o. male with history of HLD presented to [facility] ED on 09/11/2020 with 2 day history of visual disturbance.  Personally reviewed pertinent progress notes, lab work and imaging with summary provided.  Evaluated by Dr. 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  Recommended loop recorder placement outpatient to further monitor for atrial fibrillation as potential etiology.  Dr. Erlinda Hong recommended DAPT for 3 months then aspirin alone given high-grade intracranial stenosis.  No prior history of HTN or DM.  A1c 5.1.  LDL 128 and initiate atorvastatin 40 mg daily.  Other stroke risk factors include advanced age but no prior stroke history.  Evaluated by therapy and recommended outpatient PT and discharged home in stable condition.  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 Erlinda Hong.  Dr. Posey Pronto with radiology reviewed imaging and agrees. He will reach out to Dr. Collins Scotland who is on duty tonight to addend read.   MRI: Small early subacute left occipital and left temporal lobe infarcts.  2D Echo EF 65 to 70%  Recommend Loop placement to rule out afib. Scheduled  outpt 10/02/20  LE venous doppler no DVT  LDL 128  HgbA1c 5.1  VTE PPX: Lovenox 40mg     No anticoagulant/antiplatelet prior to admission, now on ASA 325 mg and Plavix 75 mg DAPT x 3 months then ASA 325mg  alone given high grade intracranial stenosis.   Therapy recommendations:  outpt PT  Disposition: home  Today, 10/10/2020, James Ramsey is being seen for hospital follow-up unaccompanied.  Reports residual visual impairment and short-term memory impairment but has noted gradual improvement.  He has evaluation with ophthalmology tomorrow.  He does report baseline very mild short-term memory difficulty but has noticed worsening since recent stroke.  He has been able to maintain ADLs and IADLs independently without difficulty.  Denies new or worsening stroke/TIA symptoms. Per insurance requirements, plan on completing 30-day cardiac monitor first and if negative, may consider loop recorder placement.  He is currently awaiting to receive monitor in the mail.  He has remained on aspirin and Plavix with mild bruising but no bleeding.  He questions ongoing duration of both aspirin and Plavix.  Remains on atorvastatin 40 mg daily without myalgias.  He is hesitant and concerned of continuing on atorvastatin due to possible side effects (such as cognitive impairment).  Blood pressure today 126/75.  No further concerns at this time.     ROS:   14 system review of systems performed and negative with exception of those listed in HPI  PMH:  Past Medical History:  Diagnosis Date  . Stroke (cerebrum) ([facility])  PSH: No past surgical history on file.  Social History:  Social History   Socioeconomic History  . Marital status: Married    Spouse name: Not on file  . Number of children: Not on file  . Years of education: Not on file  . Highest education level: Not on file  Occupational History  . Not on file  Tobacco Use  . Smoking status: Never Smoker  . Smokeless tobacco: Never Used  Substance  and Sexual Activity  . Alcohol use: Yes    Comment: occ  . Drug use: Never  . Sexual activity: Not on file  Other Topics Concern  . 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 Breast cancer Mother   . CAD Father   . High Cholesterol Father   . Valvular heart disease Father     Medications:   Current Outpatient Medications on File Prior to Visit  Medication Sig Dispense Refill  . aspirin EC 325 MG EC tablet Take 1 tablet (325 mg total) by mouth daily. 30 tablet 0  . atorvastatin (LIPITOR) 40 MG tablet Take 1 tablet (40 mg total) by mouth daily. 30 tablet 11  . Calcium Carbonate-Vitamin D 600-200 MG-UNIT TABS Take 1 tablet by mouth See admin instructions. Takes twice a week  Wednesday and sunday    . clopidogrel (PLAVIX) 75 MG tablet Take 1 tablet (75 mg total) by mouth daily. 30 tablet 11  . OVER THE COUNTER MEDICATION Macular Supplement 1 tablet daily     No current facility-administered medications on file prior to visit.    Allergies:  No Known Allergies    OBJECTIVE:  Physical Exam  Vitals:   10/10/20 1354  BP: 126/75  Pulse: 68  Weight: 209 lb 3.2 oz (94.9 kg)  Height: 5\' 11"  (1.803 m)   Body mass index is 29.18 kg/m. No exam data present  General: well developed, well nourished, pleasant elderly Caucasian man, seated, in no evident distress Head: head normocephalic and atraumatic.   Neck: supple with no carotid or supraclavicular bruits Cardiovascular: regular rate and rhythm, no murmurs Musculoskeletal: no deformity Skin:  no rash/petichiae Vascular:  Normal pulses all  extremities   Neurologic Exam Mental Status: Awake and fully alert.   Fluent speech and language.  Oriented to place and time. Recent memory subjectively impaired and remote memory intact. Attention span, concentration and fund of knowledge appropriate. Mood and affect appropriate.  Cranial Nerves: Fundoscopic exam reveals sharp disc margins. Pupils equal, briskly reactive to light. Extraocular movements full without nystagmus. Visual fields decreased visual acuity superior homonymous quadrantanopia.  Hearing intact. Facial sensation intact. Face, tongue, palate moves normally and symmetrically.  Motor: Normal bulk and tone. Normal strength in all tested extremity muscles Sensory.: intact to touch , pinprick , position and vibratory sensation.  Coordination: Rapid alternating movements normal in all extremities. Finger-to-nose and heel-to-shin performed accurately bilaterally. Gait and Station: Arises from chair without difficulty. Stance is normal. Gait demonstrates normal stride length and balance Reflexes: 1+ and symmetric. Toes downgoing.     NIHSS  1 Modified  Rankin  1      ASSESSMENT: James Ramsey is a 80 y.o. year old male presented with 2-day history of visual disturbance on 09/11/2020 with stroke work-up revealing punctate multifocal strokes L PCA territory (left occipital and temporal lobe) in setting of left PCA segmental stenosis, likely embolic and less likely atherosclerosis secondary to unknown source. Vascular risk factors include HLD and age.      PLAN:  1. L PCA stroke :  a. Residual deficit: Visual impairment and short-term memory loss.  Follow-up with ophthalmology tomorrow and request exam be faxed to office after completion.  Discussed importance of routine brain exercises to potentially improve short-term memory loss.  This will be further evaluated at follow-up visit if he continues to have concerns.   b. Complete 30-day cardiac event monitor and if  negative, potentially proceed with ILR long-term monitoring of possible atrial fibrillation.   c. Continue aspirin 81 mg daily  and atorvastatin 40 mg daily for secondary stroke prevention.  Discussed importance and recommendation of ongoing statin use although he remains hesitant to continue, he plans on further discussing with PCP at follow-up visit.  At discharge, recommended 3 months DAPT in setting of left PCA stenosis - further discussed with Dr. Leonie Man and as no other areas of stenosis, more likely embolic etiology.  He has completed 4 weeks of DAPT and due to bruising concern, okay to discontinue Plavix and continue aspirin 81 mg daily for secondary stroke prevention d. Discussed secondary stroke prevention measures and importance of close PCP follow up for aggressive stroke risk factor management  2. HLD: LDL goal <70. Recent LDL 128 therefore initiated atorvastatin 40 mg daily.  Highly encouraged continuation and request follow-up with PCP in the next 1 to 2 months for repeat lipid panel and ongoing prescribing of atorvastatin    Follow up in 3 months or call earlier if needed   CC:  [facility] provider: Dr. Elita Boone, Bill Salinas, MD    I spent 45 minutes of face-to-face and non-face-to-face time with patient.  This included previsit chart review including hospitalization pertinent progress notes, lab work and imaging, lab review, study review, order entry, electronic health record documentation, patient education regarding recent stroke and etiology, residual deficits, importance of managing stroke risk factors, reviewed MRI and MRA with patient and answered all other questions to patient satisfaction   Frann Rider, [facility]  [facility] [address] [place], [place] [address]  Phone [phone] Fax [phone] Note: This document was prepared with digital dictation and possible smart phrase technology. Any transcriptional errors that result from  this process are unintentional.

## 2020-10-10 NOTE — Progress Notes (Signed)
I agree with the above plan 

## 2020-10-10 NOTE — Patient Instructions (Signed)
Follow up with eye doctor for visual exam - please have them send report to our office once you see them  Continue aspirin 325 mg daily and clopidogrel 75 mg daily  and atorvastatin  for secondary stroke prevention  Continue to follow up with PCP regarding cholesterol management  Maintain strict control of cholesterol with LDL cholesterol (bad cholesterol) goal below 70 mg/dL.       Followup in the future with me in 3 months or call earlier if needed       Thank you for coming to see [place] at [facility]. I hope we have been able to provide you high quality care today.  You may receive a patient satisfaction survey over the next few weeks. We would appreciate your feedback and comments so that we may continue to improve ourselves and the health of our patients.

## 2020-10-11 ENCOUNTER — Encounter: Payer: Self-pay | Admitting: Internal Medicine

## 2020-10-11 ENCOUNTER — Ambulatory Visit (INDEPENDENT_AMBULATORY_CARE_PROVIDER_SITE_OTHER): Payer: Medicare PPO

## 2020-10-11 DIAGNOSIS — R000 Tachycardia, unspecified: Secondary | ICD-10-CM | POA: Diagnosis not present

## 2020-10-11 DIAGNOSIS — I4891 Unspecified atrial fibrillation: Secondary | ICD-10-CM | POA: Diagnosis not present

## 2020-10-11 DIAGNOSIS — I639 Cerebral infarction, unspecified: Secondary | ICD-10-CM

## 2020-10-11 DIAGNOSIS — H40013 Open angle with borderline findings, low risk, bilateral: Secondary | ICD-10-CM | POA: Diagnosis not present

## 2020-10-12 ENCOUNTER — Ambulatory Visit: Payer: Medicare PPO | Admitting: Internal Medicine

## 2020-10-23 DIAGNOSIS — C44319 Basal cell carcinoma of skin of other parts of face: Secondary | ICD-10-CM | POA: Diagnosis not present

## 2020-10-23 DIAGNOSIS — L570 Actinic keratosis: Secondary | ICD-10-CM | POA: Diagnosis not present

## 2020-10-29 DIAGNOSIS — I480 Paroxysmal atrial fibrillation: Secondary | ICD-10-CM

## 2020-10-29 HISTORY — DX: Paroxysmal atrial fibrillation: I48.0

## 2020-10-30 ENCOUNTER — Telehealth: Payer: Self-pay | Admitting: Adult Health

## 2020-10-30 ENCOUNTER — Telehealth: Payer: Self-pay | Admitting: Cardiology

## 2020-10-30 ENCOUNTER — Other Ambulatory Visit: Payer: Self-pay | Admitting: Internal Medicine

## 2020-10-30 DIAGNOSIS — I480 Paroxysmal atrial fibrillation: Secondary | ICD-10-CM

## 2020-10-30 MED ORDER — APIXABAN 5 MG PO TABS: 5.0000 mg | ORAL_TABLET | Freq: Two times a day (BID) | ORAL | 3 refills | Status: DC

## 2020-10-30 MED ORDER — METOPROLOL SUCCINATE ER 25 MG PO TB24: 12.5000 mg | ORAL_TABLET | Freq: Every day | ORAL | 3 refills | Status: DC

## 2020-10-30 NOTE — Telephone Encounter (Signed)
Called to update patient about follow up not requiring loop any longer at this time. Still needs follow up for new atrial fibrillation.

## 2020-10-30 NOTE — Telephone Encounter (Signed)
[employer] monitoring company calling to notify first documentation of afib  HR 110 and then converted to NSR  Patient has recent CVA (cryptogenic, left PCA) and was sent on a holter monitor.he is on DAPT (for 3 months) given high grade left PCA stenosis on CTA head and neck  We will prescribe him eliquis 5mg  BID and toprol xl 12.5mg  daily  Need to discuss with Neurology- if aspirin can be dropped and continue plavix and eliquis.

## 2020-10-30 NOTE — Addendum Note (Signed)
Addended by: Darrell Jewel on: 10/30/2020 11:42 AM   Modules accepted: Orders

## 2020-10-30 NOTE — Telephone Encounter (Signed)
*  STAT* If patient is at the pharmacy, call can be transferred to refill team.   1. Which medications need to be refilled? (please list name of each medication and dose if known) apixaban (ELIQUIS) 5 MG TABS tablet,   metoprolol succinate (TOPROL XL) 25 MG 24 hr tablet     2. Which pharmacy/location (including street and city if local pharmacy) is medication to be sent to? [id number] - [place], [place] - [address]  3. Do they need a 30 day or 90 day supply? [place]

## 2020-10-30 NOTE — Telephone Encounter (Signed)
Pt is requesting a refill on metoprolol. This medication was refilled with another provider. Would Dr. Rayann Heman like to refill metoprolol? Please address

## 2020-10-30 NOTE — Telephone Encounter (Signed)
Called in prescriptions to [facility]

## 2020-10-30 NOTE — Telephone Encounter (Signed)
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

## 2020-10-31 ENCOUNTER — Telehealth: Payer: Self-pay | Admitting: Internal Medicine

## 2020-10-31 NOTE — Telephone Encounter (Signed)
Thank you for update. Dr. Rudi Rummage reached out to me yesterday via staff message regarding further recommendations of aspirin and Plavix with initiating Eliquis. At this point, he should no longer be on Plavix and should only be on aspirin alone for secondary stroke prevention. As Eliquis will be initiated, aspirin can be discontinued as there is no indication from a stroke standpoint to continue aspirin in addition with Eliquis. Please let me know if you have any questions or concerns. Thank you again.  Janett Billow, NP

## 2020-10-31 NOTE — Telephone Encounter (Signed)
Educated on new medications, atrial fibrillation and updated about up coming follow up and loop recorder.  Patient verbalized understanding.

## 2020-10-31 NOTE — Telephone Encounter (Signed)
Patient states he is returning a call from [facility] yesterday. I did not see any notes.

## 2020-10-31 NOTE — Telephone Encounter (Signed)
Called a left detailed message to stop aspirin.

## 2020-10-31 NOTE — Addendum Note (Signed)
Addended by: Darrell Jewel on: 10/31/2020 04:16 PM   Modules accepted: Orders

## 2020-11-14 ENCOUNTER — Other Ambulatory Visit: Payer: Self-pay | Admitting: Internal Medicine

## 2020-11-14 DIAGNOSIS — I639 Cerebral infarction, unspecified: Secondary | ICD-10-CM

## 2020-11-14 DIAGNOSIS — I4891 Unspecified atrial fibrillation: Secondary | ICD-10-CM

## 2020-12-06 DIAGNOSIS — Z7901 Long term (current) use of anticoagulants: Secondary | ICD-10-CM | POA: Diagnosis not present

## 2020-12-06 DIAGNOSIS — Z125 Encounter for screening for malignant neoplasm of prostate: Secondary | ICD-10-CM | POA: Diagnosis not present

## 2020-12-06 DIAGNOSIS — Z8673 Personal history of transient ischemic attack (TIA), and cerebral infarction without residual deficits: Secondary | ICD-10-CM | POA: Diagnosis not present

## 2020-12-06 DIAGNOSIS — I480 Paroxysmal atrial fibrillation: Secondary | ICD-10-CM | POA: Diagnosis not present

## 2020-12-06 DIAGNOSIS — E7800 Pure hypercholesterolemia, unspecified: Secondary | ICD-10-CM | POA: Diagnosis not present

## 2020-12-06 DIAGNOSIS — Z0000 Encounter for general adult medical examination without abnormal findings: Secondary | ICD-10-CM | POA: Diagnosis not present

## 2020-12-13 ENCOUNTER — Other Ambulatory Visit: Payer: Self-pay

## 2020-12-13 ENCOUNTER — Encounter: Payer: Self-pay | Admitting: Internal Medicine

## 2020-12-13 ENCOUNTER — Ambulatory Visit: Payer: Medicare PPO | Admitting: Internal Medicine

## 2020-12-13 VITALS — BP 138/80 | HR 56 | Ht 71.0 in | Wt 207.6 lb

## 2020-12-13 DIAGNOSIS — I480 Paroxysmal atrial fibrillation: Secondary | ICD-10-CM | POA: Diagnosis not present

## 2020-12-13 DIAGNOSIS — I639 Cerebral infarction, unspecified: Secondary | ICD-10-CM

## 2020-12-13 MED ORDER — METOPROLOL SUCCINATE ER 25 MG PO TB24: 12.5000 mg | ORAL_TABLET | ORAL | 3 refills | Status: DC | PRN

## 2020-12-13 NOTE — Progress Notes (Signed)
   PCP: Aretta Nip, MD  * Primary EP: Dr Tilford Pillar James Ramsey is a 80 y.o. male who presents today for routine electrophysiology followup.  Since last being seen in our clinic, the patient reports doing very well. He has been found to have afib by 30 day monitor 10/29/20.  He was asymptomatic.  Started on eliquis and doing well.  Today, he denies symptoms of palpitations, chest pain, shortness of breath,  lower extremity edema, dizziness, presyncope, or syncope.  The patient is otherwise without complaint today.   Past Medical History:  Diagnosis Date  . Stroke (cerebrum) [facility])    History reviewed. No pertinent surgical history.  ROS- all systems are reviewed and negatives except as per HPI above  Current Outpatient Medications  Medication Sig Dispense Refill  . apixaban (ELIQUIS) 5 MG TABS tablet Take 1 tablet (5 mg total) by mouth 2 (two) times daily. 60 tablet 3  . Calcium Carbonate-Vitamin D 600-200 MG-UNIT TABS Take 1 tablet by mouth See admin instructions. Takes twice a week  Wednesday and sunday    . metoprolol succinate (TOPROL XL) 25 MG 24 hr tablet Take 0.5 tablets (12.5 mg total) by mouth daily. 45 tablet 3  . OVER THE COUNTER MEDICATION Macular Supplement 1 tablet daily     No current facility-administered medications for this visit.    Physical Exam: Vitals:   12/13/20 1118  BP: 138/80  Pulse: (!) 56  SpO2: 96%  Weight: 207 lb 9.6 oz (94.2 kg)  Height: 5\' 11"  (1.803 m)    GEN- The patient is well appearing, alert and oriented x 3 today.   Head- normocephalic, atraumatic Eyes-  Sclera clear, conjunctiva pink Ears- hearing intact Oropharynx- clear Lungs- Clear to ausculation bilaterally, normal work of breathing Heart- Regular rate and rhythm, no murmurs, rubs or gallops, PMI not laterally displaced GI- soft, NT, ND, + BS Extremities- no clubbing, cyanosis, or edema  Wt Readings from Last 3 Encounters:  12/13/20 207 lb 9.6 oz (94.2 kg)   10/10/20 209 lb 3.2 oz (94.9 kg)  10/02/20 206 lb (93.4 kg)    EKG tracing ordered today is personally reviewed and shows sinus  Assessment and Plan:  1. Paroxymal atrial fibrillation/ atrial flutter Noted on loop recorder 10/30/20 Started on eliquis  Asymptomatic He will take toprol prn Follows his heart rate with his smart watch.  Return in a year   Thompson Grayer MD, [facility] 12/13/2020 11:18 AM

## 2020-12-13 NOTE — Patient Instructions (Addendum)
Medication Instructions:  Change your Toprol to PRN Your physician recommends that you continue on your current medications as directed. Please refer to the Current Medication list given to you today.  Labwork: None ordered.  Testing/Procedures: None ordered.  Follow-Up: Your physician wants you to follow-up in: one year with Thompson Grayer, MD or one of the following Advanced Practice Providers on your designated Care Team:      Tommye Standard, PA-C     You will receive a reminder letter in the mail two months in advance. If you don't receive a letter, please call our office to schedule the follow-up appointment.   Any Other Special Instructions Will Be Listed Below (If Applicable).  If you need a refill on your cardiac medications before your next appointment, please call your pharmacy.

## 2021-02-06 ENCOUNTER — Ambulatory Visit: Payer: Medicare PPO | Admitting: Adult Health

## 2021-02-22 ENCOUNTER — Other Ambulatory Visit: Payer: Self-pay | Admitting: Internal Medicine

## 2021-02-22 DIAGNOSIS — I480 Paroxysmal atrial fibrillation: Secondary | ICD-10-CM

## 2021-02-22 NOTE — Telephone Encounter (Signed)
Eliquis 5mg  refill request received. Patient is 80 years old, weight-94.2kg, Crea-1.07 on 09/11/20, Diagnosis-Afib, and last seen by Dr. Rayann Heman on 12/13/2020. Dose is appropriate based on dosing criteria. Will send in refill to requested pharmacy.

## 2021-04-03 DIAGNOSIS — D485 Neoplasm of uncertain behavior of skin: Secondary | ICD-10-CM | POA: Diagnosis not present

## 2021-04-03 DIAGNOSIS — C44612 Basal cell carcinoma of skin of right upper limb, including shoulder: Secondary | ICD-10-CM | POA: Diagnosis not present

## 2021-04-03 DIAGNOSIS — L905 Scar conditions and fibrosis of skin: Secondary | ICD-10-CM | POA: Diagnosis not present

## 2021-04-03 DIAGNOSIS — C44329 Squamous cell carcinoma of skin of other parts of face: Secondary | ICD-10-CM | POA: Diagnosis not present

## 2021-04-03 DIAGNOSIS — Z85828 Personal history of other malignant neoplasm of skin: Secondary | ICD-10-CM | POA: Diagnosis not present

## 2021-04-12 ENCOUNTER — Telehealth: Payer: Self-pay | Admitting: Physician Assistant

## 2021-04-12 ENCOUNTER — Encounter: Payer: Self-pay | Admitting: Adult Health

## 2021-04-12 ENCOUNTER — Ambulatory Visit: Payer: Medicare PPO | Admitting: Adult Health

## 2021-04-12 VITALS — BP 141/76 | HR 49 | Ht 71.0 in | Wt 207.0 lb

## 2021-04-12 DIAGNOSIS — I63432 Cerebral infarction due to embolism of left posterior cerebral artery: Secondary | ICD-10-CM

## 2021-04-12 DIAGNOSIS — I480 Paroxysmal atrial fibrillation: Secondary | ICD-10-CM

## 2021-04-12 DIAGNOSIS — E785 Hyperlipidemia, unspecified: Secondary | ICD-10-CM

## 2021-04-12 NOTE — Patient Instructions (Addendum)
Continue Eliquis (apixaban) daily for secondary stroke prevention  Continue to follow with Dr. Rayann Heman for atrial fibrillation and Eliquis monitoring  Continue to follow up with PCP regarding cholesterol and blood pressure management  Maintain strict control of hypertension with blood pressure goal below 130/90 and cholesterol with LDL cholesterol (bad cholesterol) goal below 70 mg/dL.       Followup in the future with me in 6 months or call earlier if needed       Thank you for coming to see [place] at [facility]. I hope we have been able to provide you high quality care today.  You may receive a patient satisfaction survey over the next few weeks. We would appreciate your feedback and comments so that we may continue to improve ourselves and the health of our patients.

## 2021-04-12 NOTE — Progress Notes (Signed)
[facility] [address] [place]. [place] [address] [phone]       STROKE FOLLOW UP NOTE  [name] Date of Birth:  17-Jan-1941 Medical Record Number:  [id number]   Reason for Referral: stroke follow up    SUBJECTIVE:   CHIEF COMPLAINT:  Chief Complaint  Patient presents with   Follow-up    RM 3 alone Pt is well and stable, no new complications      HPI:   Today, 04/12/2021, [name] returns for 36-monthstroke follow-up.  Overall doing well.  Vision overall greatly improved but will have occasional fluctuation of right upper peripheral field blurred vision only lasting short duration usually with computer use.  Eval by ophthalmology 10/11/2020 no evidence of visual damage from stroke (see phone note 2/28).  Reports follow-up visit end of August.  Denies further short-term memory concerns besides normal age-related.  Denies new stroke/TIA symptoms.  Completed cardiac monitor which showed evidence of A. fib on 10/30/2020.  Eliquis initiated which he has remained on without side effects.  Remains off statin - reports lab work in April with PCP which was satisfactory -unable to personally view via epic.  Blood pressure today 141/76.  No further concerns at this time.    History provided for reference purposes only Initial visit 10/10/2020 JM: [name] being seen for hospital follow-up unaccompanied.  Reports residual visual impairment and short-term memory impairment but has noted gradual improvement.  He has evaluation with ophthalmology tomorrow.  He does report baseline very mild short-term memory difficulty but has noticed worsening since recent stroke.  He has been able to maintain ADLs and IADLs independently without difficulty.  Denies new or worsening stroke/TIA symptoms. Per insurance requirements, plan on completing 30-day cardiac monitor first and if negative, may consider loop recorder placement.  He is currently awaiting to receive  monitor in the mail.  He has remained on aspirin and Plavix with mild bruising but no bleeding.  He questions ongoing duration of both aspirin and Plavix.  Remains on atorvastatin 40 mg daily without myalgias.  He is hesitant and concerned of continuing on atorvastatin due to possible side effects (such as cognitive impairment).  Blood pressure today 126/75.  No further concerns at this time.  Stroke admission 09/11/2020 [name] a 80y.o. male with history of [facility] 2 day history of visual disturbance.  Personally reviewed pertinent progress notes, lab work and imaging with summary provided.  Evaluated by Dr. 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  Recommended loop recorder placement outpatient to further monitor for atrial fibrillation as potential etiology.  Dr. XErlinda Hongrecommended DAPT for 3 months then aspirin alone given high-grade intracranial stenosis.  No prior history of HTN or DM.  A1c 5.1.  LDL 128 and initiate atorvastatin 40 mg daily.  Other stroke risk factors include advanced age but no prior stroke history.  Evaluated by therapy and recommended outpatient PT and discharged home in stable condition.  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 CT head No acute abnormality CTA head & neck: Left PCA short segment high grade stenosis noted on review by Dr. XErlinda Hong  Dr. PPosey Prontowith radiology reviewed imaging and agrees. He will reach out  to Dr. Collins Scotland who is on duty tonight to addend read.  MRI: Small early subacute left occipital and left temporal lobe infarcts. 2D Echo EF 65 to 70% Recommend Loop placement to rule out afib. Scheduled outpt 10/02/20 LE venous doppler no  DVT LDL 128 HgbA1c 5.1 VTE PPX: Lovenox '40mg'$    No anticoagulant/antiplatelet prior to admission, now on ASA 325 mg and Plavix 75 mg DAPT x 3 months then ASA '325mg'$  alone given high grade intracranial stenosis.  Therapy recommendations:  outpt PT Disposition: home      ROS:   14 system review of systems performed and negative with exception of those listed in HPI  PMH:  Past Medical History:  Diagnosis Date   Paroxysmal atrial fibrillation ([place]) 10/29/2020   Stroke (cerebrum) (HCC)     PSH: History reviewed. No pertinent surgical history.  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   Social Determinants of Health   Financial Resource Strain: Not on file  Food Insecurity: Not on file  Transportation Needs: Not on file  Physical Activity: Not on file  Stress: Not on file  Social Connections: Not on file  Intimate Partner Violence: Not on file    Family History:  Family History  Problem Relation Age of Onset   Breast cancer Mother    CAD Father    High Cholesterol Father    Valvular heart disease Father     Medications:   Current Outpatient Medications on File Prior to Visit  Medication Sig Dispense Refill   apixaban (ELIQUIS) 5 MG TABS tablet TAKE 1 TABLET TWICE A DAY 60 tablet 5   Calcium Carbonate-Vitamin D 600-200 MG-UNIT TABS Take 1 tablet by mouth See admin instructions. Takes twice a week  Wednesday and sunday     metoprolol succinate (TOPROL XL) 25 MG 24  hr tablet Take 0.5 tablets (12.5 mg total) by mouth as needed (as needed daily for Afib). 15 tablet 3   OVER THE COUNTER MEDICATION Macular Supplement 1 tablet daily     No current facility-administered medications on file prior to visit.    Allergies:  No Known Allergies    OBJECTIVE:  Physical Exam  Vitals:   04/12/21 0727  BP: (!) 141/76  Pulse: (!) 49  Weight: 207 lb (93.9 kg)  Height: '5\' 11"'$  (1.803 m)    Body mass index is 28.87 kg/m. No results found.  General: well developed, well nourished, pleasant elderly Caucasian man, seated, in no evident distress Head: head normocephalic and atraumatic.   Neck: supple with no carotid or supraclavicular bruits Cardiovascular: regular rate and rhythm, no murmurs Musculoskeletal: no deformity Skin:  no rash/petichiae Vascular:  Normal pulses all extremities   Neurologic Exam Mental Status: Awake and fully alert.   Fluent speech and language.  Oriented to place and time. Recent and remote memory intact. Attention span, concentration and  fund of knowledge appropriate. Mood and affect appropriate.  Cranial Nerves: Pupils equal, briskly reactive to light. Extraocular movements full without nystagmus. Visual fields mild decreased visual acuity (blurred vision) superior homonymous quadrantanopia with finger confrontation.  Hearing intact. Facial sensation intact. Face, tongue, palate moves normally and symmetrically.  Motor: Normal bulk and tone. Normal strength in all tested extremity muscles Sensory.: intact to touch , pinprick , position and vibratory sensation.  Coordination: Rapid alternating movements normal in all extremities. Finger-to-nose and heel-to-shin performed accurately bilaterally. Gait and Station: Arises from chair without difficulty. Stance is normal. Gait demonstrates normal stride length and balance without use of assistive device Reflexes: 1+ and symmetric. Toes downgoing.         ASSESSMENT: James Ramsey is a 80 y.o. year old male presented with 2-day history of visual disturbance on 09/11/2020 with stroke work-up revealing punctate multifocal strokes L PCA territory (left occipital and temporal lobe) in setting of left PCA segmental stenosis, likely embolic with cardiac monitor showing evidence of A. Fib in 10/2020. Vascular risk factors include HLD and age.      PLAN:  L PCA stroke :  Residual deficit: Subjective visual impairment with occasional fluctuation of symptoms - has f/u visit with ophthalmology end of this month. Eval 10/2020 no evidence of deficits from stroke.   Continue Eliquis (apixaban) daily  and atorvastatin 40 mg daily for secondary stroke prevention.   Discussed secondary stroke prevention measures and importance of close PCP follow up for aggressive stroke risk factor management  Atrial fibrillation: Found on 30-day monitor 10/29/2020 likely stroke etiology.  Continue Eliquis for CHA2DS2-VASc score of at least 5.  Routinely followed by Dr. Rayann Heman HLD: LDL goal <70. Repeat lipid panel with PCP 12/2020 - reports slightly over the norm since making dietary changes.  Remains off statin per pt preference.  Recommend routine monitoring of lipids with PCP and if LDL remains above goal, statin therapy recommended for secondary stroke prevention measures    Follow up in 6 months or call earlier if needed   CC:  [facility] provider: Dr. Elita Boone, Bill Salinas, MD    I spent 36 minutes of face-to-face and non-face-to-face time with patient.  This included previsit chart review, lab review, study review, electronic health record documentation, patient education regarding prior stroke and etiology with new finding of A. fib, residual deficits, secondary stroke prevention measures and importance of managing stroke risk factors and answered all other questions to patient satisfaction   Frann Rider, AGNP-BC  [facility] [address] [place],  [address]  Phone [phone] Fax [phone] Note: This document was prepared with digital dictation and possible smart phrase technology. Any transcriptional errors that result from this process are unintentional.

## 2021-04-12 NOTE — Telephone Encounter (Signed)
error 

## 2021-04-30 DIAGNOSIS — H353132 Nonexudative age-related macular degeneration, bilateral, intermediate dry stage: Secondary | ICD-10-CM | POA: Diagnosis not present

## 2021-04-30 DIAGNOSIS — I69898 Other sequelae of other cerebrovascular disease: Secondary | ICD-10-CM | POA: Diagnosis not present

## 2021-04-30 DIAGNOSIS — H40013 Open angle with borderline findings, low risk, bilateral: Secondary | ICD-10-CM | POA: Diagnosis not present

## 2021-04-30 DIAGNOSIS — H2513 Age-related nuclear cataract, bilateral: Secondary | ICD-10-CM | POA: Diagnosis not present

## 2021-05-21 DIAGNOSIS — C44329 Squamous cell carcinoma of skin of other parts of face: Secondary | ICD-10-CM | POA: Diagnosis not present

## 2021-06-01 DIAGNOSIS — L905 Scar conditions and fibrosis of skin: Secondary | ICD-10-CM | POA: Diagnosis not present

## 2021-06-01 DIAGNOSIS — L7621 Postprocedural hemorrhage and hematoma of skin and subcutaneous tissue following a dermatologic procedure: Secondary | ICD-10-CM | POA: Diagnosis not present

## 2021-06-01 DIAGNOSIS — C44612 Basal cell carcinoma of skin of right upper limb, including shoulder: Secondary | ICD-10-CM | POA: Diagnosis not present

## 2021-08-20 ENCOUNTER — Other Ambulatory Visit: Payer: Self-pay | Admitting: Internal Medicine

## 2021-08-20 DIAGNOSIS — I480 Paroxysmal atrial fibrillation: Secondary | ICD-10-CM

## 2021-08-20 NOTE — Telephone Encounter (Signed)
Eliquis 5 mg refill request received. Patient is 80 years old, weight-93.9 kg, Crea- 1.07 on 09/11/20, Diagnosis-PAF, and last seen by Dr. Rayann Heman on 12/13/20. Dose is appropriate based on dosing criteria. Will send in refill to requested pharmacy.

## 2021-10-22 ENCOUNTER — Encounter: Payer: Self-pay | Admitting: Adult Health

## 2021-10-22 ENCOUNTER — Ambulatory Visit: Payer: Medicare PPO | Admitting: Adult Health

## 2021-10-22 VITALS — BP 158/78 | HR 58 | Ht 71.0 in | Wt 208.8 lb

## 2021-10-22 DIAGNOSIS — G5603 Carpal tunnel syndrome, bilateral upper limbs: Secondary | ICD-10-CM

## 2021-10-22 DIAGNOSIS — I63432 Cerebral infarction due to embolism of left posterior cerebral artery: Secondary | ICD-10-CM

## 2021-10-22 NOTE — Progress Notes (Addendum)
[facility] [address] [place]. [place] [address] [phone]       STROKE FOLLOW UP NOTE  [name] Date of Birth:  Nov 02, 1940 Medical Record Number:  [id number]   Reason for Referral: stroke follow up    SUBJECTIVE:   CHIEF COMPLAINT:  Chief Complaint  Patient presents with   Follow-up    Room 3. Pt alone. He reports he has been doing well.      HPI:   Update 10/22/2021 JM: 81 year old male who returns for 20-month stroke follow-up.  Overall doing well without new stroke/TIA symptoms.  Does report intermittent right upper peripheral "checkerboard" type vision lasting short duration approx 2-3x /month. Was told by ophthalmologist possibly migraine related without associated migraine headache. This has been persistent over the past year without worsening.  Denies history of migraines or family history of migraines.  Compliant on Eliquis and omega-3 without side effects.  Had follow-up in November with PCP  - reports labs satisfactory (unable to view via epic).  Blood pressure today 167/77 and on recheck 158/78.  Occasionally monitors at home and typically stable. Does mention likely R>L carpal tunnel syndrome previously treated conservatively but more recently, symptoms of been more persistent despite use of wrist brace and other conservative measures.  No further concerns at this time.    History provided for reference purposes only Update 04/12/2021 JM: James Ramsey returns for 19-month stroke follow-up.  Overall doing well.  Vision overall greatly improved but will have occasional fluctuation of right upper peripheral field blurred vision only lasting short duration usually with computer use.  Eval by ophthalmology 10/11/2020 no evidence of visual damage from stroke (see phone note 2/28).  Reports follow-up visit end of August.  Denies further short-term memory concerns besides normal age-related.  Denies new stroke/TIA symptoms.  Completed cardiac  monitor which showed evidence of A. fib on 10/30/2020.  Eliquis initiated which he has remained on without side effects.  Remains off statin - reports lab work in April with PCP which was satisfactory -unable to personally view via epic.  Blood pressure today 141/76.  No further concerns at this time.  Initial visit 10/10/2020 JM: James Ramsey is being seen for hospital follow-up unaccompanied.  Reports residual visual impairment and short-term memory impairment but has noted gradual improvement.  He has evaluation with ophthalmology tomorrow.  He does report baseline very mild short-term memory difficulty but has noticed worsening since recent stroke.  He has been able to maintain ADLs and IADLs independently without difficulty.  Denies new or worsening stroke/TIA symptoms. Per insurance requirements, plan on completing 30-day cardiac monitor first and if negative, may consider loop recorder placement.  He is currently awaiting to receive monitor in the mail.  He has remained on aspirin and Plavix with mild bruising but no bleeding.  He questions ongoing duration of both aspirin and Plavix.  Remains on atorvastatin 40 mg daily without myalgias.  He is hesitant and concerned of continuing on atorvastatin due to possible side effects (such as cognitive impairment).  Blood pressure today 126/75.  No further concerns at this time.  Stroke admission 09/11/2020 [name] is a 81 y.o. male with history of HLD presented to [facility] ED on 09/11/2020 with 2 day history of visual disturbance.  Personally reviewed pertinent progress notes, lab work and imaging with summary provided.  Evaluated by Dr. Erlinda Hong with stroke work-up revealing punctate multifocal strokes in the left PCA territory in setting of left PCA segmental  stenosis, likely embolic secondary to unknown source with lower suspicion for arthrosclerosis.  Recommended loop recorder placement outpatient to further monitor for atrial fibrillation as potential  etiology.  Dr. Erlinda Hong recommended DAPT for 3 months then aspirin alone given high-grade intracranial stenosis.  No prior history of HTN or DM.  A1c 5.1.  LDL 128 and initiate atorvastatin 40 mg daily.  Other stroke risk factors include advanced age but no prior stroke history.  Evaluated by therapy and recommended outpatient PT and discharged home in stable condition.  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 CT head No acute abnormality CTA head & neck: Left PCA short segment high grade stenosis noted on review by Dr. Erlinda Hong.  Dr. Posey Pronto with radiology reviewed imaging and agrees. He will reach out to Dr. Collins Scotland who is on duty tonight to addend read.  MRI: Small early subacute left occipital and left temporal lobe infarcts. 2D Echo EF 65 to 70% Recommend Loop placement to rule out afib. Scheduled outpt 10/02/20 LE venous doppler no DVT LDL 128 HgbA1c 5.1 VTE PPX: Lovenox 40mg    No anticoagulant/antiplatelet prior to admission, now on ASA 325 mg and Plavix 75 mg DAPT x 3 months then ASA 325mg  alone given high grade intracranial stenosis.  Therapy recommendations:  outpt PT Disposition: home      ROS:   14 system review of systems performed and negative with exception of those listed in HPI  PMH:  Past Medical History:  Diagnosis Date   Paroxysmal atrial fibrillation ([place]) 10/29/2020   Stroke (cerebrum) ([facility])     PSH: History reviewed. No pertinent surgical history.  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   Social Determinants of Health   Financial Resource Strain: Not on file  Food Insecurity: Not on file  Transportation Needs: Not on file  Physical Activity: Not on file  Stress: Not on file  Social Connections: Not on file  Intimate Partner Violence: Not on file    Family History:  Family History  Problem Relation Age of Onset   Breast cancer Mother    CAD Father    High Cholesterol Father    Valvular heart disease Father     Medications:   Current Outpatient Medications on File Prior to Visit  Medication Sig Dispense Refill   Calcium Carbonate-Vitamin D 600-200 MG-UNIT TABS Take 1 tablet by mouth See admin instructions. Takes twice a week  Wednesday and sunday     ELIQUIS 5 MG TABS tablet TAKE 1 TABLET BY MOUTH TWICE A DAY 60 tablet 5   metoprolol succinate (TOPROL XL) 25 MG 24 hr tablet Take 0.5 tablets (12.5 mg total) by mouth as needed (as needed daily for Afib). 15 tablet 3   OVER THE COUNTER MEDICATION Macular Supplement 1 tablet daily  No current facility-administered medications on file prior to visit.    Allergies:  No Known Allergies    OBJECTIVE:  Physical Exam  Vitals:   10/22/21 0823 10/22/21 0900  BP: (!) 167/77 (!) 158/78  Pulse: (!) 58   Weight: 208 lb 12.8 oz (94.7 kg)   Height: 5\' 11"  (1.803 m)    Body mass index is 29.12 kg/m. No results found.  General: well developed, well nourished, very pleasant elderly Caucasian man, seated, in no evident distress Head: head normocephalic and atraumatic.   Neck: supple with no carotid or supraclavicular bruits Cardiovascular: regular rate and rhythm, no murmurs Musculoskeletal: no deformity Skin:  no rash/petichiae Vascular:  Normal pulses all extremities   Neurologic Exam Mental Status: Awake and fully alert.   Fluent speech and language.  Oriented to place and  time. Recent and remote memory intact. Attention span, concentration and fund of knowledge appropriate. Mood and affect appropriate.  Cranial Nerves: Pupils equal, briskly reactive to light. Extraocular movements full without nystagmus. Visual fields intact to confrontation testing.  Hearing intact. Facial sensation intact. Face, tongue, palate moves normally and symmetrically.  Motor: Normal bulk and tone. Normal strength in all tested extremity muscles Sensory.: intact to touch , pinprick , position and vibratory sensation.  Coordination: Rapid alternating movements normal in all extremities. Finger-to-nose and heel-to-shin performed accurately bilaterally. Gait and Station: Arises from chair without difficulty. Stance is normal. Gait demonstrates normal stride length and balance without use of assistive device Reflexes: 1+ and symmetric. Toes downgoing.         ASSESSMENT: Trevor Wilkie is a 81 y.o. year old male presented with 2-day history of visual disturbance on 09/11/2020 with stroke work-up revealing punctate multifocal strokes L PCA territory (left occipital and temporal lobe) in setting of left PCA segmental stenosis, likely embolic with cardiac monitor showing evidence of A. Fib in 10/2020. Vascular risk factors include HLD and age. 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 Unable to appreciate deficits on today's exam. No indication for preventative therapy currently as episodes infrequent and does not interfere with activity or function - can continue to be monitored by PCP and ophthalmology Continue Eliquis (apixaban) daily and omega-3 for secondary stroke prevention.   Discussed secondary stroke prevention measures and  importance of close PCP follow up for aggressive stroke risk factor management including HDL with LDL goal <70 (declines statin use) Atrial fibrillation: Found on 30-day monitor 10/29/2020 likely stroke etiology.  Continue Eliquis for CHA2DS2-VASc score of at least 5.  Routinely followed by cardiology Suspected CTS: order placed to EMG/NCV - will refer if indicated based on results    Doing well from stroke standpoint and risk factors are managed by PCP. She may follow up PRN, as usual for our patients who are strictly being followed for stroke. If any new neurological issues should arise, request PCP place referral for evaluation by one of our neurologists. Thank you.     CC:  Rankins, Bill Salinas, MD    I spent 34 minutes of face-to-face and non-face-to-face time with patient.  This included previsit chart review, lab review, study review, electronic health record documentation, patient education regarding prior stroke and etiology, secondary stroke prevention measures and importance of managing stroke risk factors, visual symptoms and  possible etiology, concern of carpal tunnel syndrome and further evaluation and answered all other questions to patient satisfaction   Frann Rider, [facility]  [facility] [address] [place], [place] [address]  Phone [phone] Fax [phone] Note: This document was prepared with digital dictation and possible smart phrase technology. Any transcriptional errors that result from this process are unintentional.

## 2021-10-22 NOTE — Patient Instructions (Addendum)
Continue Eliquis (apixaban) daily for secondary stroke prevention  Continue routine follow-up with cardiology for Eliquis and atrial fibrillation management  Continue to follow up with PCP regarding cholesterol and blood pressure management  Maintain strict control of hypertension with blood pressure goal below 130/90 and cholesterol with LDL cholesterol (bad cholesterol) goal below 70 mg/dL.   Signs of a Stroke? Follow the BEFAST method:  Balance Watch for a sudden loss of balance, trouble with coordination or vertigo Eyes Is there a sudden loss of vision in one or both eyes? Or double vision?  Face: Ask the person to smile. Does one side of the face droop or is it numb?  Arms: Ask the person to raise both arms. Does one arm drift downward? Is there weakness or numbness of a leg? Speech: Ask the person to repeat a simple phrase. Does the speech sound slurred/strange? Is the person confused ? Time: If you observe any of these signs, call 911.  You will set up visit to complete a nerve conduction study to further evaluate for carpal tunnel syndrome      Thank you for coming to see [place] at [facility]. I hope we have been able to provide you high quality care today.  You may receive a patient satisfaction survey over the next few weeks. We would appreciate your feedback and comments so that we may continue to improve ourselves and the health of our patients.   Electromyoneurogram Electromyoneurogram is a test to check how well your muscles and nerves are working. This procedure includes the combined use of electromyogram (EMG) and nerve conduction study (NCS). EMG is used to evaluate muscles and the nerves that control those muscles. NCS, which is also called electroneurogram, measures how well your nerves conduct electricity. The procedures should be done together to check if your muscles and nerves are healthy. If the results of the tests are abnormal, this may indicate  disease or injury, such as a neuromuscular disease or peripheral nerve damage. Tell a health care provider about: Any allergies you have. All medicines you are taking, including vitamins, herbs, eye drops, creams, and over-the-counter medicines. Any bleeding problems you have. Any surgeries you have had. Any medical conditions you have. What are the risks? Generally, this is a safe procedure. However, problems may occur, including: Bleeding or bruising. Infection where the electrodes were inserted. What happens before the test? Medicines Take all of your usually prescribed medications before this testing is performed. Do not stop your blood thinners unless advised by your prescribing physician. General instructions Your health care provider may ask you to warm the limb that will be checked with warm water, hot pack, or wrapping the limb in a blanket. Do not use lotions or creams on the same day that you will be having the procedure. What happens during the test? For EMG  Your health care provider will ask you to stay in a position so that the muscle being studied can be accessed. You will be sitting or lying down. You may be given a medicine to numb the area (local anesthetic) and the skin will be disinfected. A very thin needle that has an electrode will be inserted into your muscle, one muscle at a time. Typically, multiple muscles are evaluated during a single study. Another small electrode will be placed on your skin near the muscle. Your health care provider will ask you to continue to remain still. The electrodes will record the electrical activity of your muscles. You may  see this on a monitor or hear it in the room. After your muscles have been studied at rest, your health care provider will ask you to contract or flex your muscles. The electrodes will record the electrical activity of your muscles. Your health care provider will remove the electrodes and the electrode needle when  the procedure is finished. The procedure may vary among health care providers and hospitals. For NCS  An electrode that records your nerve activity (recording electrode) will be placed on your skin by the muscle that is being studied. An electrode that is used as a reference (reference electrode) will be placed near the recording electrode. A paste or gel will be applied to your skin between the recording electrode and the reference electrode. Your nerve will be stimulated with a mild shock. The speed of the nerves and strength of response is recorded by the electrodes. Your health care provider will remove the electrodes and the gel when the procedure is finished. The procedure may vary among health care providers and hospitals. What can I expect after the test? It is up to you to get your test results. Ask your health care provider, or the department that is doing the test, when your results will be ready. Your health care provider may: Give you medicines for any pain. Monitor the insertion sites to make sure that bleeding stops. You should be able to drive yourself to and from the test. Discomfort can persist for a few hours after the test, but should be better the next day. Contact a health care provider if: You have swelling, redness, or drainage at any of the insertion sites. Summary Electromyoneurogram is a test to check how well your muscles and nerves are working. If the results of the tests are abnormal, this may indicate disease or injury. This is a safe procedure. However, problems may occur, such as bleeding and infection. Your health care provider will do two tests to complete this procedure. One checks your muscles (EMG) and another checks your nerves (NCS). It is up to you to get your test results. Ask your health care provider, or the department that is doing the test, when your results will be ready. This information is not intended to replace advice given to you by your  health care provider. Make sure you discuss any questions you have with your health care provider. Document Revised: 05/02/2021 Document Reviewed: 04/01/2021 Elsevier Patient Education  [place].

## 2021-10-31 DIAGNOSIS — H40013 Open angle with borderline findings, low risk, bilateral: Secondary | ICD-10-CM | POA: Diagnosis not present

## 2021-11-05 ENCOUNTER — Other Ambulatory Visit: Payer: Self-pay

## 2021-11-05 ENCOUNTER — Encounter (INDEPENDENT_AMBULATORY_CARE_PROVIDER_SITE_OTHER): Payer: Medicare PPO | Admitting: Diagnostic Neuroimaging

## 2021-11-05 ENCOUNTER — Ambulatory Visit: Payer: Medicare PPO | Admitting: Diagnostic Neuroimaging

## 2021-11-05 DIAGNOSIS — Z0289 Encounter for other administrative examinations: Secondary | ICD-10-CM

## 2021-11-05 DIAGNOSIS — G5603 Carpal tunnel syndrome, bilateral upper limbs: Secondary | ICD-10-CM | POA: Diagnosis not present

## 2021-11-05 NOTE — Procedures (Signed)
? ?[facility] ? ?NCS (NERVE CONDUCTION STUDY) WITH EMG (ELECTROMYOGRAPHY) REPORT ? ? ?STUDY DATE: 11/05/21 ?PATIENT NAME: James Ramsey ?DOB: 1940-11-13 ?MRN: [id number] ? ?ORDERING CLINICIAN: Frann Rider, NP  ? ?TECHNOLOGIST: Sherre Scarlet ?ELECTROMYOGRAPHER: Liviah Cake R. Topacio Cella, MD ? ?CLINICAL INFORMATION: 81 year old male with with bilateral hand numbness. ? ?FINDINGS: ?NERVE CONDUCTION STUDY: ?Right median motor responses prolonged distal latency, normal amplitude and normal conduction velocity. ? ?Left median motor responses prolonged distal latency, with partial conduction block stimulation at the upper arm.  Stimulation of the ulnar nerve above and below the elbow with recording over the abductor pollicis brevis demonstrates motor response. ? ?Bilateral ulnar motor responses are normal. ? ?Bilateral median sensory response cannot be obtained. ? ?Bilateral ulnar sensory responses are normal. ? ?Bilateral ulnar latencies are normal. ? ? ? ?NEEDLE ELECTROMYOGRAPHY: ? ?Needle examination of right upper extremity is normal. ? ? ? ?IMPRESSION:  ? ?Abnormal study demonstrating: ?- Bilateral median neuropathies at the wrist consistent with bilateral carpal tunnel syndrome. ?- Possible proximal ulnar to median anastomosis (reverse martin-gruber or marinacci variant) noted.  ? ? ?INTERPRETING PHYSICIAN:  ?Penni Bombard, MD ?Certified in Neurology, Neurophysiology and Neuroimaging ? ?[facility] ?[address], [address] ?[place], [place] [address] ?[phone] ? ? ?[place] ?   ?Nerve / Sites Muscle Latency Ref. Amplitude Ref. Rel Amp Segments Distance Velocity Ref. Area  ?  ms ms mV mV %  cm m/s m/s mVms  ?R Median - APB  ?   Wrist APB 7.7 ?4.4 6.7 ?4.0 100 Wrist - APB 7   31.0  ?   Upper arm APB 12.0  6.2  93.2 Upper arm - Wrist 24 56 ?49 30.7  ?L Median - APB  ?   Wrist APB 6.4 ?4.4 7.9 ?4.0 100 Wrist - APB 7   33.3  ?   Upper arm APB 16.8  0.1  1.32 Upper arm - Wrist 24 23  ?49 0.9  ?   Ulnar B Elbow APB 8.2  3.0  2853 Ulnar B Elbow - APB    12.0  ?   Ulnar A Elbow APB 10.8  3.7  124 Ulnar A Elbow - APB    15.0  ?R Ulnar - ADM  ?   Wrist ADM 3.2 ?3.3 10.3 ?6.0 100 Wrist - ADM 7   44.4  ?   B.Elbow ADM 7.3  9.3  90.1 B.Elbow - Wrist 21 50 ?49 42.4  ?   A.Elbow ADM 9.4  9.5  102 A.Elbow - B.Elbow 10 49 ?49 41.6  ?L Ulnar - ADM  ?   Wrist ADM 3.2 ?3.3 8.4 ?6.0 100 Wrist - ADM 7   35.5  ?   B.Elbow ADM 6.5  5.8  68.5 B.Elbow - Wrist 21 62 ?49 22.9  ?   A.Elbow ADM 8.4  7.9  137 A.Elbow - B.Elbow 10 55 ?49 33.1  ?           ?[place] ?   ?Nerve / Sites Rec. Site Peak Lat Ref.  Amp Ref. Segments Distance  ?  ms ms ?V ?V  cm  ?R Median - Orthodromic (Dig II, Mid palm)  ?   Dig II Wrist NR ?3.4 NR ?10 Dig II - Wrist 13  ?L Median - Orthodromic (Dig II, Mid palm)  ?   Dig II Wrist NR ?3.4 NR ?10 Dig II - Wrist 13  ?R Ulnar - Orthodromic, (Dig V, Mid palm)  ?  Dig V Wrist 3.4 ?3.1 7 ?5 Dig V - Wrist 11  ?L Ulnar - Orthodromic, (Dig V, Mid palm)  ?   Dig V Wrist 3.1 ?3.1 6 ?5 Dig V - Wrist 11  ?           ?F  Wave ?   ?Nerve F Lat Ref.  ? ms ms  ?R Ulnar - ADM 30.1 ?32.0  ?L Ulnar - ADM 30.2 ?32.0  ?       ?EMG Summary Table   ? Spontaneous MUAP Recruitment  ?Muscle IA Fib PSW Fasc Other Amp Dur. Poly Pattern  ?R. Deltoid Normal None None None _______ Normal Normal Normal Normal  ?R. Biceps brachii Normal None None None _______ Normal Normal Normal Normal  ?R. Triceps brachii Normal None None None _______ Normal Normal Normal Normal  ?R. Flexor carpi radialis Normal None None None _______ Normal Normal Normal Normal  ?R. Flexor carpi ulnaris Normal None None None _______ Normal Normal Normal Normal  ?R. First dorsal interosseous Normal None None None _______ Normal Normal Normal Normal  ? ?  ?

## 2021-11-06 ENCOUNTER — Encounter: Payer: Self-pay | Admitting: Adult Health

## 2021-11-06 ENCOUNTER — Other Ambulatory Visit: Payer: Self-pay | Admitting: Adult Health

## 2021-11-06 DIAGNOSIS — G5603 Carpal tunnel syndrome, bilateral upper limbs: Secondary | ICD-10-CM

## 2021-11-08 ENCOUNTER — Other Ambulatory Visit: Payer: Self-pay

## 2021-11-08 ENCOUNTER — Encounter: Payer: Self-pay | Admitting: Orthopedic Surgery

## 2021-11-08 ENCOUNTER — Ambulatory Visit: Payer: Medicare PPO | Admitting: Orthopedic Surgery

## 2021-11-08 DIAGNOSIS — G5603 Carpal tunnel syndrome, bilateral upper limbs: Secondary | ICD-10-CM

## 2021-11-08 NOTE — H&P (View-Only) (Signed)
? ?Office Visit Note ?  ?Patient: James Ramsey           ?Date of Birth: 11-25-1940           ?MRN: [id number] ?Visit Date: 11/08/2021 ?             ?Requested by: Frann Rider, NP ?[phone] [address] ?[place],  [place] [address] ?PCP: Aretta Nip, MD ? ? ?Assessment & Plan: ?Visit Diagnoses:  ?1. Bilateral carpal tunnel syndrome   ? ? ?Plan: We discussed the diagnosis, prognosis, and both conservative and operative treatment options for carpal tunnel syndrome. ? ?After our discussion, the patient has elected to proceed with right carpal tunnel release.  We reviewed the benefits of surgery and the potential risks including, but not limited to, persistent symptoms, infection, damage to nearby nerves and blood vessels, delayed wound healing, need for additional surgery.   ? ?All patient concerns and questions were addressed. ? ?A surgical date will be confirmed with the patient.   ? ?Follow-Up Instructions: No follow-ups on file.  ? ?Orders:  ?No orders of the defined types were placed in this encounter. ? ?No orders of the defined types were placed in this encounter. ? ? ? ? Procedures: ?No procedures performed ? ? ?Clinical Data: ?No additional findings. ? ? ?Subjective: ?Chief Complaint  ?Patient presents with  ? Left Hand - New Patient (Initial Visit)  ? Right Hand - New Patient (Initial Visit)  ? ? ?This is a 81 year old right-hand-dominant male who presents with bilateral carpal tunnel syndrome that was confirmed on electrodiagnostic study from 11/05/2021.  He notes that this has been going on for about 2 years.  Progressively worsening.  His right hand is more affected than his left hand.  He describes numbness and tingling in the thumb, index, middle, and ring finger.  The small finger is uninvolved. The numbness is intermittent but becoming more frequent.  He does have nocturnal symptoms 4 nights per week.  He previously tried extension wrist braces but stopped about 4 months ago as they seem to be  making his symptoms worse.  She notes that he has difficulty with certain fine motor tasks such as buttoning buttons.  He has no history of diabetes, hypothyroidism, inflammatory arthropathy, wrist trauma, or cervical spine issue. ? ? ?Review of Systems ? ? ?Objective: ?Vital Signs: BP (!) 143/82 (BP Location: Right Arm, Patient Position: Sitting, Cuff Size: Large)   Pulse 70   SpO2 96%  ? ?Physical Exam ?Constitutional:   ?   Appearance: Normal appearance.  ?Cardiovascular:  ?   Rate and Rhythm: Normal rate.  ?   Pulses: Normal pulses.  ?Pulmonary:  ?   Effort: Pulmonary effort is normal.  ?Skin: ?   General: Skin is warm and dry.  ?   Capillary Refill: Capillary refill takes less than 2 seconds.  ?Neurological:  ?   Mental Status: He is alert.  ? ? ?Right Hand Exam  ? ?Tenderness  ?The patient is experiencing no tenderness.  ? ?Range of Motion  ?The patient has normal right wrist ROM.  ? ?Other  ?Erythema: absent ?Sensation: normal ?Pulse: present ? ?Comments:  Negative Tinel.  Strongly positive Phalen.  5/5 thenar motor strength without atrophy.  ? ? ?Left Hand Exam  ? ?Tenderness  ?The patient is experiencing no tenderness.  ? ?Range of Motion  ?The patient has normal left wrist ROM. ? ?Other  ?Erythema: absent ?Sensation: normal ?Pulse: present ? ?Comments:  Negative Tinel.  Strongly positive Phalen.  5/5 thenar motor strength without atrophy.  ? ? ? ? ?Specialty Comments:  ?No specialty comments available. ? ?Imaging: ?No results found. ? ? ?PMFS History: ?Patient Active Problem List  ? Diagnosis Date Noted  ? 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 Acute ischemic stroke ([place]) 09/11/2020  ? ?Past Medical History:  ?Diagnosis Date  ? Paroxysmal atrial fibrillation ([place]) 10/29/2020  ? Stroke (cerebrum) ([place])   ?  ?Family History  ?Problem Relation Age of Onset  ? Breast cancer Mother   ? CAD Father   ? High Cholesterol Father   ? Valvular heart disease Father   ?  ?History reviewed. No pertinent  surgical history. ?Social History  ? ?Occupational History  ? Not on file  ?Tobacco Use  ? Smoking status: Never  ? Smokeless tobacco: Never  ?Substance and Sexual Activity  ? Alcohol use: Yes  ?  Comment: occ  ? Drug use: Never  ? Sexual activity: Not on file  ? ? ? ? ? ? ?

## 2021-11-08 NOTE — Progress Notes (Signed)
? ?Office Visit Note ?  ?Patient: James Ramsey           ?Date of Birth: Dec 30, 1940           ?MRN: [id number] ?Visit Date: 11/08/2021 ?             ?Requested by: Frann Rider, NP ?[phone] ?[place],  [place] [address] ?PCP: Aretta Nip, MD ? ? ?Assessment & Plan: ?Visit Diagnoses:  ?1. Bilateral carpal tunnel syndrome   ? ? ?Plan: We discussed the diagnosis, prognosis, and both conservative and operative treatment options for carpal tunnel syndrome. ? ?After our discussion, the patient has elected to proceed with right carpal tunnel release.  We reviewed the benefits of surgery and the potential risks including, but not limited to, persistent symptoms, infection, damage to nearby nerves and blood vessels, delayed wound healing, need for additional surgery.   ? ?All patient concerns and questions were addressed. ? ?A surgical date will be confirmed with the patient.   ? ?Follow-Up Instructions: No follow-ups on file.  ? ?Orders:  ?No orders of the defined types were placed in this encounter. ? ?No orders of the defined types were placed in this encounter. ? ? ? ? Procedures: ?No procedures performed ? ? ?Clinical Data: ?No additional findings. ? ? ?Subjective: ?Chief Complaint  ?Patient presents with  ? Left Hand - New Patient (Initial Visit)  ? Right Hand - New Patient (Initial Visit)  ? ? ?This is a 81 year old right-hand-dominant male who presents with bilateral carpal tunnel syndrome that was confirmed on electrodiagnostic study from 11/05/2021.  He notes that this has been going on for about 2 years.  Progressively worsening.  His right hand is more affected than his left hand.  He describes numbness and tingling in the thumb, index, middle, and ring finger.  The small finger is uninvolved. The numbness is intermittent but becoming more frequent.  He does have nocturnal symptoms 4 nights per week.  He previously tried extension wrist braces but stopped about 4 months ago as they seem to be  making his symptoms worse.  She notes that he has difficulty with certain fine motor tasks such as buttoning buttons.  He has no history of diabetes, hypothyroidism, inflammatory arthropathy, wrist trauma, or cervical spine issue. ? ? ?Review of Systems ? ? ?Objective: ?Vital Signs: BP (!) 143/82 (BP Location: Right Arm, Patient Position: Sitting, Cuff Size: Large)   Pulse 70   SpO2 96%  ? ?Physical Exam ?Constitutional:   ?   Appearance: Normal appearance.  ?Cardiovascular:  ?   Rate and Rhythm: Normal rate.  ?   Pulses: Normal pulses.  ?Pulmonary:  ?   Effort: Pulmonary effort is normal.  ?Skin: ?   General: Skin is warm and dry.  ?   Capillary Refill: Capillary refill takes less than 2 seconds.  ?Neurological:  ?   Mental Status: He is alert.  ? ? ?Right Hand Exam  ? ?Tenderness  ?The patient is experiencing no tenderness.  ? ?Range of Motion  ?The patient has normal right wrist ROM.  ? ?Other  ?Erythema: absent ?Sensation: normal ?Pulse: present ? ?Comments:  Negative Tinel.  Strongly positive Phalen.  5/5 thenar motor strength without atrophy.  ? ? ?Left Hand Exam  ? ?Tenderness  ?The patient is experiencing no tenderness.  ? ?Range of Motion  ?The patient has normal left wrist ROM. ? ?Other  ?Erythema: absent ?Sensation: normal ?Pulse: present ? ?Comments:  Negative Tinel.  Strongly positive Phalen.  5/5 thenar motor strength without atrophy.  ? ? ? ? ?Specialty Comments:  ?No specialty comments available. ? ?Imaging: ?No results found. ? ? ?PMFS History: ?Patient Active Problem List  ? Diagnosis Date Noted  ? 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 Acute ischemic stroke ([place]) 09/11/2020  ? ?Past Medical History:  ?Diagnosis Date  ? Paroxysmal atrial fibrillation ([place]) 10/29/2020  ? Stroke (cerebrum) ([place])   ?  ?Family History  ?Problem Relation Age of Onset  ? Breast cancer Mother   ? CAD Father   ? High Cholesterol Father   ? Valvular heart disease Father   ?  ?History reviewed. No pertinent  surgical history. ?Social History  ? ?Occupational History  ? Not on file  ?Tobacco Use  ? Smoking status: Never  ? Smokeless tobacco: Never  ?Substance and Sexual Activity  ? Alcohol use: Yes  ?  Comment: occ  ? Drug use: Never  ? Sexual activity: Not on file  ? ? ? ? ? ? ?

## 2021-11-09 ENCOUNTER — Telehealth: Payer: Self-pay

## 2021-11-09 NOTE — Telephone Encounter (Signed)
? ?  Pre-operative Risk Assessment  ?  ?Patient Name: James Ramsey  ?DOB: 1940-09-22 ?MRN: [id number]  ? ?  ? ?Request for Surgical Clearance   ? ?Procedure:   RIGHT CARPAL TUNNEL RELEASE ? ?Date of Surgery:  Clearance TBD                              ?   ?Surgeon:  DR. Turley ?Surgeon's Group or Practice Name:  [facility] ?Phone number:  [phone] ?Fax number:  [phone] ATTN: APRIL ?  ?Type of Clearance Requested:   ?- Pharmacy:  Hold Apixaban (Eliquis) PLEASE ADVISE ?  ?Type of Anesthesia:   CHOICE ?  ?Additional requests/questions:   ? ?Signed, ?Jacinta Shoe   ?11/09/2021, 3:55 PM   ?

## 2021-11-12 ENCOUNTER — Telehealth: Payer: Self-pay | Admitting: *Deleted

## 2021-11-12 DIAGNOSIS — I480 Paroxysmal atrial fibrillation: Secondary | ICD-10-CM | POA: Insufficient documentation

## 2021-11-12 DIAGNOSIS — Z7901 Long term (current) use of anticoagulants: Secondary | ICD-10-CM | POA: Insufficient documentation

## 2021-11-12 DIAGNOSIS — E7800 Pure hypercholesterolemia, unspecified: Secondary | ICD-10-CM | POA: Insufficient documentation

## 2021-11-12 NOTE — Telephone Encounter (Signed)
Left message for the pt to call back and ask to s/w pre op team, so that we may set up a telephone visit.  ?

## 2021-11-12 NOTE — Telephone Encounter (Signed)
Pt agreeable to tele visit for pre op with Almyra Deforest, [facility] 11/14/21 @ 9 am, med rec and consent done.  ?

## 2021-11-12 NOTE — Telephone Encounter (Signed)
?  Patient Consent for Virtual Visit  ? ? ?   ? ?James Ramsey has provided verbal consent on 11/12/2021 for a virtual visit (video or telephone). ? ? ?CONSENT FOR VIRTUAL VISIT FOR:  James Ramsey  ?By participating in this virtual visit I agree to the following: ? ?I hereby voluntarily request, consent and authorize [facility] and its employed or contracted physicians, physician assistants, nurse practitioners or other licensed health care professionals (the Practitioner), to provide me with telemedicine health care services (the ?Services") as deemed necessary by the treating Practitioner. I acknowledge and consent to receive the Services by the Practitioner via telemedicine. I understand that the telemedicine visit will involve communicating with the Practitioner through live audiovisual communication technology and the disclosure of certain medical information by electronic transmission. I acknowledge that I have been given the opportunity to request an in-person assessment or other available alternative prior to the telemedicine visit and am voluntarily participating in the telemedicine visit. ? ?I understand that I have the right to withhold or withdraw my consent to the use of telemedicine in the course of my care at any time, without affecting my right to future care or treatment, and that the Practitioner or I may terminate the telemedicine visit at any time. I understand that I have the right to inspect all information obtained and/or recorded in the course of the telemedicine visit and may receive copies of available information for a reasonable fee.  I understand that some of the potential risks of receiving the Services via telemedicine include:  ?Delay or interruption in medical evaluation due to technological equipment failure or disruption; ?Information transmitted may not be sufficient (e.g. poor resolution of images) to allow for appropriate medical decision making by the  Practitioner; and/or  ?In rare instances, security protocols could fail, causing a breach of personal health information. ? ?Furthermore, I acknowledge that it is my responsibility to provide information about my medical history, conditions and care that is complete and accurate to the best of my ability. I acknowledge that Practitioner's advice, recommendations, and/or decision may be based on factors not within their control, such as incomplete or inaccurate data provided by me or distortions of diagnostic images or specimens that may result from electronic transmissions. I understand that the practice of medicine is not an exact science and that Practitioner makes no warranties or guarantees regarding treatment outcomes. I acknowledge that a copy of this consent can be made available to me via my patient portal ([place]), or I can request a printed copy by calling the office of [place].   ? ?I understand that my insurance will be billed for this visit.  ? ?I have read or had this consent read to me. ?I understand the contents of this consent, which adequately explains the benefits and risks of the Services being provided via telemedicine.  ?I have been provided ample opportunity to ask questions regarding this consent and the Services and have had my questions answered to my satisfaction. ?I give my informed consent for the services to be provided through the use of telemedicine in my medical care ? ? ? ?

## 2021-11-12 NOTE — Telephone Encounter (Signed)
Callback pool, please arrange telephone virtual visit with preop APP after our clinical pharmacist has reviewed Eliquis ?

## 2021-11-12 NOTE — Telephone Encounter (Signed)
Clinical pharmacist to review Eliquis 

## 2021-11-12 NOTE — Telephone Encounter (Signed)
Patient with diagnosis of afib on Eliquis for anticoagulation.   ? ?Procedure: right carpal tunnel release ?Date of procedure: TBD ? ?CHA2DS2-VASc Score = 5  ?This indicates a 7.2% annual risk of stroke. ?The patient's score is based upon: ?CHF History: 0 ?HTN History: 1 ?Diabetes History: 0 ?Stroke History: 2 ?Vascular Disease History: 0 ?Age Score: 2 ?Gender Score: 0 ?  ?CrCl 18m/min using adjusted body weight ?Platelet count 219K ? ?Per office protocol, patient can hold Eliquis for 1 day prior to procedure. He should resume anticoag as soon as safely possible after given his prior stroke. ?

## 2021-11-13 ENCOUNTER — Encounter: Payer: Self-pay | Admitting: Internal Medicine

## 2021-11-13 ENCOUNTER — Ambulatory Visit (INDEPENDENT_AMBULATORY_CARE_PROVIDER_SITE_OTHER): Payer: Medicare PPO | Admitting: Physician Assistant

## 2021-11-13 DIAGNOSIS — Z01810 Encounter for preprocedural cardiovascular examination: Secondary | ICD-10-CM

## 2021-11-13 NOTE — Telephone Encounter (Signed)
I called and s/w the pt and I apologized for my error. I have rescheduled the pt to today @ 2:20. Pt thanked me for the help.  ?

## 2021-11-13 NOTE — Telephone Encounter (Signed)
Patient states he was supposed to have a tele visit for pre op today, but the call kept coming in as a facetime from his daughter. Informed patient the appt is scheduled for tomorrow morning, but he states it is supposed to be today. ?

## 2021-11-13 NOTE — Progress Notes (Signed)
? ?Virtual Visit via Telephone Note  ? ?This visit type was conducted due to national recommendations for restrictions regarding the COVID-19 Pandemic (e.g. social distancing) in an effort to limit this patient's exposure and mitigate transmission in our community.  Due to his co-morbid illnesses, this patient is at least at moderate risk for complications without adequate follow up.  This format is felt to be most appropriate for this patient at this time.  The patient did not have access to video technology/had technical difficulties with video requiring transitioning to audio format only (telephone).  All issues noted in this document were discussed and addressed.  No physical exam could be performed with this format.  Please refer to the patient's chart for his  consent to telehealth for [facility]. ?Evaluation Performed:  Preoperative cardiovascular risk assessment ? ?This visit type was conducted due to national recommendations for restrictions regarding the COVID-19 Pandemic (e.g. social distancing).  This format is felt to be most appropriate for this patient at this time.  All issues noted in this document were discussed and addressed.  No physical exam was performed (except for noted visual exam findings with Video Visits).  Please refer to the patient's chart (MyChart message for video visits and phone note for telephone visits) for the patient's consent to telehealth for [facility]. ?_____________  ? ?Date:  11/13/2021  ? ?Patient ID:  James Ramsey, James Ramsey [birth year], MRN [id number] ?Patient Location:  ?Home ?Provider location:   ?Office ? ?Primary Care Provider:  Aretta Nip, MD ?Primary Cardiologist:  None ?Electrophysiologist: Dr. Rayann Heman ? ?Chief Complaint  ?  ?81 y.o. y/o male with a h/o paroxysmal atrial fibrillation and CVA, who is pending right carpal tunnel release, and presents today for telephonic preoperative cardiovascular risk assessment. ? ?Past Medical History  ?   ?Past Medical History:  ?Diagnosis Date  ? Paroxysmal atrial fibrillation ([place]) 10/29/2020  ? Stroke (cerebrum) ([place])   ? ?No past surgical history on file. ? ?Allergies ? ?No Known Allergies ? ?History of Present Illness  ?  ?James Ramsey is a 81 y.o. male who presents via audio/video conferencing for a telehealth visit today.  Pt was last seen in cardiology clinic on 12/13/2020, by Dr. Thompson Grayer.  At that time James Ramsey was doing well.  he is now pending right carpal tunnel surgery.  Since his last visit, he is doing well without any exertional chest pain or worsening dyspnea.  He is able to walk 2 miles a day without any exertional symptoms. ? ? ?Home Medications  ?  ?Prior to Admission medications   ?Medication Sig Start Date End Date Taking? Authorizing Provider  ?Acetylcarnitine HCl (ACETYL-L-CARNITINE HCL) POWD 300 mg by Does not apply route daily. ?Patient not taking: Reported on 11/12/2021    [provider]  ?Calcium Carb-Cholecalciferol 600-20 MG-MCG TABS Take 1 tablet by mouth 2 (two) times a week.    [provider]  ?Coenzyme Q10 200 MG capsule Take 200 mg by mouth daily.    [provider]  ?Cyanocobalamin (B-12) 250 MCG TABS Take 250 mcg by mouth daily.    [provider]  ?D-RIBOSE PO Take 500 mg by mouth daily. ?Patient not taking: Reported on 11/12/2021    [provider]  ?ELIQUIS 5 MG TABS tablet TAKE 1 TABLET BY MOUTH TWICE A DAY 08/20/21   Allred, Jeneen Rinks, MD  ?magnesium gluconate (MAGONATE) 500 MG tablet Take 500 mg by mouth daily.    [provider]  ?MEDIUM CHAIN TRIGLYCERIDES PO Take 130 mg by mouth daily.    [provider]  ?metoprolol succinate (TOPROL XL) 25 MG 24 hr tablet Take 0.5 tablets (12.5 mg total) by mouth as needed (as needed daily for Afib). 12/13/20   Allred, Jeneen Rinks, MD  ?Nattokinase 100 MG CAPS Take 100 mg by mouth daily.    [provider]  ?Omega 3 1200 MG CAPS Take 1 capsule by  mouth daily.    [provider]  ?OVER THE COUNTER MEDICATION Macular Supplement 1 tablet daily    [provider]  ?pyridOXINE (VITAMIN B-6) 100 MG tablet Take 100 mg by mouth daily. ?Patient not taking: Reported on 11/12/2021    [provider]  ?thiamine 100 MG tablet Take 100 mg by mouth daily. ?Patient not taking: Reported on 11/12/2021    [provider]  ? ? ?Physical Exam  ?  ?Vital Signs:  James Ramsey does not have vital signs available for review today. ? ?Given telephonic nature of communication, physical exam is limited. ?AAOx3. NAD. Normal affect.  Speech and respirations are unlabored. ? ?Accessory Clinical Findings  ?  ?None ? ?Assessment & Plan  ?  ?1.  Preoperative Cardiovascular Risk Assessment: ? -Patient does not have any prior history of CAD.  Recently, he has not had any recurrence of A-fib either.  He has a smart watch which he keep track of his heart rate.  Previous echocardiogram in January 2022 showed normal EF without significant valve issue.  He is able to ambulate for 2 miles per day without any exertional symptoms.  Patient can clearly accomplish more than 4 METS of activity.  Carpal tunnel surgery is considered a low risk surgery.  Patient is cleared to proceed without further work-up.  He has been instructed to hold fish oil for 7 days and to hold Eliquis for 1 day prior to the procedure and restart both as soon as possible afterward at the surgeon's discretion. ? ? ?COVID-19 Education: ?The signs and symptoms of COVID-19 were discussed with the patient and how to seek care for testing (follow up with PCP or arrange E-visit).  The importance of social distancing was discussed today. ? ?Patient Risk:   ?After full review of this patient's history and clinical status, I feel that he is at least moderate risk for cardiac complications at this time, thus necessitating a telehealth visit sooner than our first available in office visit. ? ?Time:    ?Today, I have spent 9 minutes with the patient with telehealth technology discussing medical history, symptoms, and management plan.   ? ? ?Almyra Deforest, [place] ? ?11/13/2021, 2:37 PM ? ?

## 2021-11-14 ENCOUNTER — Telehealth: Payer: Medicare PPO

## 2021-11-16 ENCOUNTER — Other Ambulatory Visit: Payer: Self-pay

## 2021-11-16 ENCOUNTER — Encounter (HOSPITAL_BASED_OUTPATIENT_CLINIC_OR_DEPARTMENT_OTHER): Payer: Self-pay | Admitting: Orthopedic Surgery

## 2021-11-26 ENCOUNTER — Other Ambulatory Visit: Payer: Self-pay

## 2021-11-26 ENCOUNTER — Encounter (HOSPITAL_BASED_OUTPATIENT_CLINIC_OR_DEPARTMENT_OTHER)
Admission: RE | Disposition: A | Discharge status: Home / Self Care | Payer: Self-pay | Source: Home / Self Care | Attending: Orthopedic Surgery

## 2021-11-26 ENCOUNTER — Ambulatory Visit (HOSPITAL_BASED_OUTPATIENT_CLINIC_OR_DEPARTMENT_OTHER)
Admission: RE | Admit: 2021-11-26 | Discharge: 2021-11-26 | Disposition: A | Discharge status: Home / Self Care | Payer: Medicare PPO | Attending: Orthopedic Surgery | Admitting: Orthopedic Surgery

## 2021-11-26 ENCOUNTER — Ambulatory Visit (HOSPITAL_BASED_OUTPATIENT_CLINIC_OR_DEPARTMENT_OTHER): Payer: Medicare PPO | Admitting: Anesthesiology

## 2021-11-26 ENCOUNTER — Encounter (HOSPITAL_BASED_OUTPATIENT_CLINIC_OR_DEPARTMENT_OTHER): Payer: Self-pay | Admitting: Orthopedic Surgery

## 2021-11-26 DIAGNOSIS — I480 Paroxysmal atrial fibrillation: Secondary | ICD-10-CM | POA: Diagnosis not present

## 2021-11-26 DIAGNOSIS — I4891 Unspecified atrial fibrillation: Secondary | ICD-10-CM

## 2021-11-26 DIAGNOSIS — G5601 Carpal tunnel syndrome, right upper limb: Secondary | ICD-10-CM

## 2021-11-26 DIAGNOSIS — G5603 Carpal tunnel syndrome, bilateral upper limbs: Secondary | ICD-10-CM | POA: Insufficient documentation

## 2021-11-26 DIAGNOSIS — Z8673 Personal history of transient ischemic attack (TIA), and cerebral infarction without residual deficits: Secondary | ICD-10-CM | POA: Insufficient documentation

## 2021-11-26 HISTORY — PX: CARPAL TUNNEL RELEASE: SHX101

## 2021-11-26 SURGERY — CARPAL TUNNEL RELEASE
Anesthesia: Monitor Anesthesia Care | Laterality: Right

## 2021-11-26 MED ORDER — DEXMEDETOMIDINE (PRECEDEX) IN NS 20 MCG/5ML (4 MCG/ML) IV SYRINGE
PREFILLED_SYRINGE | INTRAVENOUS | Status: AC
  Filled 2021-11-26: qty 10

## 2021-11-26 MED ORDER — OXYCODONE HCL 5 MG/5ML PO SOLN: 5.0000 mg | Freq: Once | ORAL | Status: DC | PRN

## 2021-11-26 MED ORDER — LACTATED RINGERS IV SOLN: INTRAVENOUS | Status: DC

## 2021-11-26 MED ORDER — ONDANSETRON HCL 4 MG/2ML IJ SOLN: 4.0000 mg | Freq: Once | INTRAMUSCULAR | Status: DC | PRN

## 2021-11-26 MED ORDER — LIDOCAINE-EPINEPHRINE 1 %-1:100000 IJ SOLN
INTRAMUSCULAR | Status: AC
  Filled 2021-11-26: qty 1

## 2021-11-26 MED ORDER — ACETAMINOPHEN 500 MG PO TABS
ORAL_TABLET | ORAL | Status: AC
  Filled 2021-11-26: qty 2

## 2021-11-26 MED ORDER — PROPOFOL 500 MG/50ML IV EMUL
INTRAVENOUS | Status: DC | PRN
  Administered 2021-11-26: 75 ug/kg/min via INTRAVENOUS

## 2021-11-26 MED ORDER — OXYCODONE HCL 5 MG PO TABS: 5.0000 mg | ORAL_TABLET | Freq: Once | ORAL | Status: DC | PRN

## 2021-11-26 MED ORDER — ONDANSETRON HCL 4 MG/2ML IJ SOLN
INTRAMUSCULAR | Status: DC | PRN
  Administered 2021-11-26: 4 mg via INTRAVENOUS

## 2021-11-26 MED ORDER — FENTANYL CITRATE (PF) 100 MCG/2ML IJ SOLN: 25.0000 ug | INTRAMUSCULAR | Status: DC | PRN

## 2021-11-26 MED ORDER — FENTANYL CITRATE (PF) 100 MCG/2ML IJ SOLN
INTRAMUSCULAR | Status: AC
  Filled 2021-11-26: qty 2

## 2021-11-26 MED ORDER — ACETAMINOPHEN 500 MG PO TABS
1000.0000 mg | ORAL_TABLET | Freq: Once | ORAL | Status: AC
  Administered 2021-11-26: 1000 mg via ORAL

## 2021-11-26 MED ORDER — BUPIVACAINE HCL (PF) 0.25 % IJ SOLN
INTRAMUSCULAR | Status: AC
  Filled 2021-11-26: qty 30

## 2021-11-26 MED ORDER — FENTANYL CITRATE (PF) 100 MCG/2ML IJ SOLN
INTRAMUSCULAR | Status: DC | PRN
  Administered 2021-11-26: 50 ug via INTRAVENOUS

## 2021-11-26 MED ORDER — LIDOCAINE 2% (20 MG/ML) 5 ML SYRINGE
INTRAMUSCULAR | Status: DC | PRN
  Administered 2021-11-26: 60 mg via INTRAVENOUS

## 2021-11-26 MED ORDER — AMISULPRIDE (ANTIEMETIC) 5 MG/2ML IV SOLN: 10.0000 mg | Freq: Once | INTRAVENOUS | Status: DC | PRN

## 2021-11-26 MED ORDER — BUPIVACAINE HCL (PF) 0.25 % IJ SOLN
INTRAMUSCULAR | Status: DC | PRN
  Administered 2021-11-26: 10 mL

## 2021-11-26 SURGICAL SUPPLY — 41 items
APL PRP STRL LF DISP 70% ISPRP (MISCELLANEOUS) ×1
BLADE SURG 15 STRL LF DISP TIS (BLADE) ×1 IMPLANT
BLADE SURG 15 STRL SS (BLADE) ×2
BNDG CMPR 9X4 STRL LF SNTH (GAUZE/BANDAGES/DRESSINGS) ×1
BNDG ELASTIC 3X5.8 VLCR STR LF (GAUZE/BANDAGES/DRESSINGS) ×2 IMPLANT
BNDG ESMARK 4X9 LF (GAUZE/BANDAGES/DRESSINGS) ×2 IMPLANT
BNDG GAUZE ELAST 4 BULKY (GAUZE/BANDAGES/DRESSINGS) ×2 IMPLANT
BNDG PLASTER X FAST 3X3 WHT LF (CAST SUPPLIES) IMPLANT
BNDG PLSTR 9X3 FST ST WHT (CAST SUPPLIES)
CHLORAPREP W/TINT 26 (MISCELLANEOUS) ×2 IMPLANT
CORD BIPOLAR FORCEPS 12FT (ELECTRODE) ×2 IMPLANT
COVER BACK TABLE 60X90IN (DRAPES) ×2 IMPLANT
COVER MAYO STAND STRL (DRAPES) ×2 IMPLANT
CUFF TOURN SGL QUICK 18X4 (TOURNIQUET CUFF) IMPLANT
CUFF TOURN SGL QUICK 24 (TOURNIQUET CUFF)
CUFF TRNQT CYL 24X4X16.5-23 (TOURNIQUET CUFF) IMPLANT
DRAPE EXTREMITY T 121X128X90 (DISPOSABLE) ×2 IMPLANT
DRAPE SURG 17X23 STRL (DRAPES) ×2 IMPLANT
GAUZE XEROFORM 1X8 LF (GAUZE/BANDAGES/DRESSINGS) IMPLANT
GLOVE SURG ENC MOIS LTX SZ7 (GLOVE) ×2 IMPLANT
GLOVE SURG UNDER POLY LF SZ7 (GLOVE) ×2 IMPLANT
GOWN STRL REUS W/ TWL LRG LVL3 (GOWN DISPOSABLE) ×1 IMPLANT
GOWN STRL REUS W/TWL LRG LVL3 (GOWN DISPOSABLE) ×2
GOWN STRL REUS W/TWL XL LVL3 (GOWN DISPOSABLE) ×2 IMPLANT
NDL HYPO 25X1 1.5 SAFETY (NEEDLE) IMPLANT
NEEDLE HYPO 25X1 1.5 SAFETY (NEEDLE) IMPLANT
NS IRRIG 1000ML POUR BTL (IV SOLUTION) ×2 IMPLANT
PACK BASIN DAY SURGERY FS (CUSTOM PROCEDURE TRAY) ×2 IMPLANT
PAD CAST 3X4 CTTN HI CHSV (CAST SUPPLIES) ×1 IMPLANT
PADDING CAST COTTON 3X4 STRL (CAST SUPPLIES) ×2
SLEEVE SCD COMPRESS KNEE MED (STOCKING) IMPLANT
SUCTION FRAZIER HANDLE 10FR (MISCELLANEOUS)
SUCTION TUBE FRAZIER 10FR DISP (MISCELLANEOUS) IMPLANT
SUT ETHILON 4 0 PS 2 18 (SUTURE) ×2 IMPLANT
SUT MNCRL AB 3-0 PS2 18 (SUTURE) IMPLANT
SUT VICRYL 4-0 PS2 18IN ABS (SUTURE) IMPLANT
SYR BULB EAR ULCER 3OZ GRN STR (SYRINGE) ×2 IMPLANT
SYR CONTROL 10ML LL (SYRINGE) IMPLANT
TOWEL GREEN STERILE FF (TOWEL DISPOSABLE) ×4 IMPLANT
TUBE CONNECTING 20X1/4 (TUBING) IMPLANT
UNDERPAD 30X36 HEAVY ABSORB (UNDERPADS AND DIAPERS) ×2 IMPLANT

## 2021-11-26 NOTE — Anesthesia Postprocedure Evaluation (Signed)
Anesthesia Post Note ? ?Patient: Yosef Krogh ? ?Procedure(s) Performed: RIGHT CARPAL TUNNEL RELEASE (Right) ? ?  ? ?Patient location during evaluation: PACU ?Anesthesia Type: MAC ?Level of consciousness: awake and alert ?Pain management: pain level controlled ?Vital Signs Assessment: post-procedure vital signs reviewed and stable ?Respiratory status: spontaneous breathing, nonlabored ventilation and respiratory function stable ?Cardiovascular status: blood pressure returned to baseline and stable ?Postop Assessment: no apparent nausea or vomiting ?Anesthetic complications: no ? ? ?No notable events documented. ? ?Last Vitals:  ?Vitals:  ? 11/26/21 1302 11/26/21 1324  ?BP:  119/62  ?Pulse: (!) 47 (!) 47  ?Resp: 12 20  ?Temp: 36.5 ?C 36.5 ?C  ?SpO2: 95% 95%  ?  ?Last Pain:  ?Vitals:  ? 11/26/21 1324  ?TempSrc: Oral  ?PainSc: 0-No pain  ? ? ?  ?  ?  ?  ?  ?  ? ?Lidia Collum ? ? ? ? ?

## 2021-11-26 NOTE — Interval H&P Note (Signed)
History and Physical Interval Note: ? ?11/26/2021 ?12:03 PM ? ?James Ramsey  has presented today for surgery, with the diagnosis of RIGHT CARPAL TUNNEL SYNDROME.  The various methods of treatment have been discussed with the patient and family. After consideration of risks, benefits and other options for treatment, the patient has consented to  Procedure(s): ?RIGHT CARPAL TUNNEL RELEASE (Right) as a surgical intervention.  The patient's history has been reviewed, patient examined, no change in status, stable for surgery.  I have reviewed the patient's chart and labs.  Questions were answered to the patient's satisfaction.   ? ? ?[name] ? ? ?

## 2021-11-26 NOTE — Transfer of Care (Signed)
Immediate Anesthesia Transfer of Care Note ? ?Patient: James Ramsey ? ?Procedure(s) Performed: RIGHT CARPAL TUNNEL RELEASE (Right) ? ?Patient Location: PACU ? ?Anesthesia Type:MAC ? ?Level of Consciousness: awake, alert , oriented, drowsy and patient cooperative ? ?Airway & Oxygen Therapy: Patient Spontanous Breathing and Patient connected to face mask oxygen ? ?Post-op Assessment: Report given to RN and Post -op Vital signs reviewed and stable ? ?Post vital signs: Reviewed and stable ? ?Last Vitals:  ?Vitals Value Taken Time  ?BP    ?Temp    ?Pulse    ?Resp    ?SpO2    ? ? ?Last Pain:  ?Vitals:  ? 11/26/21 1051  ?TempSrc: Oral  ?PainSc: 0-No pain  ?   ? ?Patients Stated Pain Goal: 4 (11/26/21 1051) ? ?Complications: No notable events documented. ?

## 2021-11-26 NOTE — Discharge Instructions (Addendum)
 Charles Benfield, M.D. Hand Surgery  POST-OPERATIVE DISCHARGE INSTRUCTIONS   PRESCRIPTIONS: - You may have been given a prescription to be taken as directed for post-operative pain control.  You may also take over the counter ibuprofen/aleve and tylenol for pain. Take this as directed on the packaging. Do not exceed 3000 mg tylenol/acetaminophen in 24 hours.  Ibuprofen 600-800 mg (3-4) tablets by mouth every 6 hours as needed for pain.   OR  Aleve 2 tablets by mouth every 12 hours (twice daily) as needed for pain.   AND/OR  Tylenol 1000 mg (2 tablets) every 8 hours as needed for pain.  - Please use your pain medication carefully, as refills are limited and you may not be provided with one.  As stated above, please use over the counter pain medicine - it will also be helpful with decreasing your swelling.    ANESTHESIA: -After your surgery, post-surgical discomfort or pain is likely. This discomfort can last several days to a few weeks. At certain times of the day your discomfort may be more intense.   Did you receive a nerve block?   - A nerve block can provide pain relief for one hour to two days after your surgery. As long as the nerve block is working, you will experience little or no sensation in the area the surgeon operated on.  - As the nerve block wears off, you will begin to experience pain or discomfort. It is very important that you begin taking your prescribed pain medication before the nerve block fully wears off. Treating your pain at the first sign of the block wearing off will ensure your pain is better controlled and more tolerable when full-sensation returns. Do not wait until the pain is intolerable, as the medicine will be less effective. It is better to treat pain in advance than to try and catch up.   General Anesthesia:  If you did not receive a nerve block during your surgery, you will need to start taking your pain medication shortly after your surgery and  should continue to do so as prescribed by your surgeon.     ICE AND ELEVATION: - You may use ice for the first 48-72 hours, but it is not critical.   - Motion of your fingers is very important to decrease the swelling.  - Elevation, as much as possible for the next 48 hours, is critical for decreasing swelling as well as for pain relief. Elevation means when you are seated or lying down, you hand should be at or above your heart. When walking, the hand needs to be at or above the level of your elbow.  - If the bandage gets too tight, it may need to be loosened. Please contact our office and we will instruct you in how to do this.    SURGICAL BANDAGES:  - Keep your dressing and/or splint clean and dry at all times.  You can remove your dressing 4 days from now and change with a dry dressing or Band-Aids as needed thereafter. - You may place a plastic bag over your bandage to shower, but be careful, do not get your bandages wet.  - After the bandages have been removed, it is OK to get the stitches wet in a shower or with hand washing. Do Not soak or submerge the wound yet. Please do not use lotions or creams on the stitches.      HAND THERAPY:  - You may not need any. If you   do, we will begin this at your follow up visit in the clinic.    ACTIVITY AND WORK: - You are encouraged to move any fingers which are not in the bandage.  - Light use of the fingers is allowed to assist the other hand with daily hygiene and eating, but strong gripping or lifting is often uncomfortable and should be avoided.  - You might miss a variable period of time from work and hopefully this issue has been discussed prior to surgery. You may not do any heavy work with your affected hand for about 2 weeks.    [facility] [address] [place],  [place]  [address] [phone]      Post Anesthesia Home Care Instructions  Activity: Get plenty of rest for the remainder of the day. A  responsible individual must stay with you for 24 hours following the procedure.  For the next 24 hours, DO NOT: -Drive a car -Operate machinery -Drink alcoholic beverages -Take any medication unless instructed by your physician -Make any legal decisions or sign important papers.  Meals: Start with liquid foods such as gelatin or soup. Progress to regular foods as tolerated. Avoid greasy, spicy, heavy foods. If nausea and/or vomiting occur, drink only clear liquids until the nausea and/or vomiting subsides. Call your physician if vomiting continues.  Special Instructions/Symptoms: Your throat may feel dry or sore from the anesthesia or the breathing tube placed in your throat during surgery. If this causes discomfort, gargle with warm salt water. The discomfort should disappear within 24 hours.  If you had a scopolamine patch placed behind your ear for the management of post- operative nausea and/or vomiting:  1. The medication in the patch is effective for 72 hours, after which it should be removed.  Wrap patch in a tissue and discard in the trash. Wash hands thoroughly with soap and water. 2. You may remove the patch earlier than 72 hours if you experience unpleasant side effects which may include dry mouth, dizziness or visual disturbances. 3. Avoid touching the patch. Wash your hands with soap and water after contact with the patch.      Call your surgeon if you experience:   1.  Fever over 101.0. 2.  Inability to urinate. 3.  Nausea and/or vomiting. 4.  Extreme swelling or bruising at the surgical site. 5.  Continued bleeding from the incision. 6.  Increased pain, redness or drainage from the incision. 7.  Problems related to your pain medication. 8.  Any problems and/or concerns  

## 2021-11-26 NOTE — Op Note (Signed)
? ?  Date of Surgery: 11/26/2021 ? ?INDICATIONS: Mr. Caravello is a 81 y.o.-year-old male with right carpal tunnel syndrome that was confirmed on electrodiagnostic studies.  Risks, benefits, and alternatives to surgery were again discussed with the patient wishing to proceed with surgery.  Informed consent was signed after our discussion.  ? ?PREOPERATIVE DIAGNOSIS:  ?Right carpal tunnel syndrome ? ?POSTOPERATIVE DIAGNOSIS: Same. ? ?PROCEDURE:  ?Right carpal tunnel release ? ? ?SURGEON: Audria Nine, M.D. ? ?ASSIST:  ? ?ANESTHESIA:  Local, MAC ? ?IV FLUIDS AND URINE: See anesthesia. ? ?ESTIMATED BLOOD LOSS: <5 mL. ? ?IMPLANTS: * No implants in log *  ? ?DRAINS: None ? ?COMPLICATIONS: None ? ?DESCRIPTION OF PROCEDURE: The patient was met in the preoperative holding area where the surgical site was marked and the consent form was verified.  The patient was then taken to the operating room and transferred to the operating table.  All bony prominences were well padded.  A tourniquet was applied to the right upper arm.  Monitored sedation was induced.   A formal time-out was performed to confirm that this was the correct patient, surgery, side, and site. A local block was performed using 10cc of 0.25% bupivicaine. The operative extremity was prepped and draped in the usual and sterile fashion.   ? ?Following a second timeout, the limb was exsanguinated and the tourniquet inflated to 250 mmHg.  A longitudinal incision was made in line with the radial border of the ring finger from distal to the wrist flexion crease to the intersection of Kaplan's cardinal line.  The skin and subcutaneous tissue was sharply divided.  The longitudinally running palmar fascia was incised.  The thenar musculature was bluntly swept off of the transverse carpal ligament.  The ligament was divided from proximal to distal until the fat surrounding the palmar arch was encountered. Retractors were then placed in the proximal aspect of the wound to  visualize the distal antebrachial fascia.  The fascia was sharply divided under direct visualization.   The wound was then thoroughly irrigated with sterile saline.  The tourniquet was deflated.  Hemostasis was achieved with direct pressure and bipolar electrocautery.  The wound was then closed with 4-0 nylon sutures in a horizontal mattress fashion. The wound was then dressed with xeroform, folded kerlix, and an ace wrap. ? ?The patient was then reversed from anesthesia and transferred to the postoperative bed.  All counts were correct x 2 at the end of the procedure.  The patient was taken to the recovery unit in stable condition.    ? ? ?POSTOPERATIVE PLAN: Patient will be discharged to home with appropriate pain medication and discharge instructions.  I will see him back in the office in 10-14 days for his first postop visit.  ? ?Audria Nine, MD ?12:40 PM  ? ?

## 2021-11-26 NOTE — Brief Op Note (Signed)
11/26/2021 ? ?12:39 PM ? ?PATIENT:  James Ramsey  81 y.o. male ? ?PRE-OPERATIVE DIAGNOSIS:  RIGHT CARPAL TUNNEL SYNDROME ? ?POST-OPERATIVE DIAGNOSIS:  RIGHT CARPAL TUNNEL SYNDROME ? ?PROCEDURE:  Procedure(s): ?RIGHT CARPAL TUNNEL RELEASE (Right) ? ?SURGEON:  Surgeon(s) and Role: ?   * Sherilyn Cooter, MD - Primary ? ?PHYSICIAN ASSISTANT:  ? ?ASSISTANTS: none  ? ?ANESTHESIA:   local and MAC ? ?EBL:  <5 cc  ? ?BLOOD ADMINISTERED:none ? ?DRAINS: none  ? ?LOCAL MEDICATIONS USED:  BUPIVICAINE  ? ?SPECIMEN:  No Specimen ? ?DISPOSITION OF SPECIMEN:  N/A ? ?COUNTS:  YES ? ?TOURNIQUET:   ?Total Tourniquet Time Documented: ?Upper Arm (Right) - 13 minutes ?Total: Upper Arm (Right) - 13 minutes ? ? ?DICTATION: .Dragon Dictation ? ?PLAN OF CARE: Discharge to home after PACU ? ?PATIENT DISPOSITION:  PACU - hemodynamically stable. ?  ?Delay start of Pharmacological VTE agent (>24hrs) due to surgical blood loss or risk of bleeding: not applicable ? ?

## 2021-11-26 NOTE — Anesthesia Procedure Notes (Signed)
Procedure Name: [place] ?Date/Time: 11/26/2021 12:19 PM ?Performed by: Signe Colt, CRNA ?Pre-anesthesia Checklist: Patient identified, Emergency Drugs available, Suction available, Patient being monitored and Timeout performed ?Patient Re-evaluated:Patient Re-evaluated prior to induction ? ? ? ? ?

## 2021-11-26 NOTE — Anesthesia Preprocedure Evaluation (Signed)
Anesthesia Evaluation  ?Patient identified by MRN, date of birth, ID band ?Patient awake ? ? ? ?Reviewed: ?Allergy & Precautions, NPO status , Patient's Chart, lab work & pertinent test results, reviewed documented beta blocker date and time  ? ?History of Anesthesia Complications ?Negative for: history of anesthetic complications ? ?Airway ?Mallampati: I ? ?TM Distance: >3 FB ?Neck ROM: Full ? ? ? Dental ? ?(+) Teeth Intact ?  ?Pulmonary ?neg pulmonary ROS,  ?  ?Pulmonary exam normal ? ? ? ? ? ? ? Cardiovascular ?Normal cardiovascular exam+ dysrhythmias Atrial Fibrillation  ? ? ?Echo 09/12/20: EF 65-70%, no RWMA, mild LVH, g1dd, normal RVSF, mild AV sclerosis w/o stenosis ?  ?Neuro/Psych ?CVA (Jan 2022), No Residual Symptoms   ? GI/Hepatic ?negative GI ROS, Neg liver ROS,   ?Endo/Other  ?negative endocrine ROS ? Renal/GU ?negative Renal ROS  ?negative genitourinary ?  ?Musculoskeletal ?negative musculoskeletal ROS ?(+)  ? Abdominal ?  ?Peds ? Hematology ?negative hematology ROS ?(+)   ?Anesthesia Other Findings ? ?Eliquis ? Reproductive/Obstetrics ? ?  ? ? ? ? ? ? ? ? ? ? ? ? ? ?  ?  ? ? ? ? ? ? ? ? ?Anesthesia Physical ?Anesthesia Plan ? ?ASA: 3 ? ?Anesthesia Plan: MAC  ? ?Post-op Pain Management: Tylenol PO (pre-op)*  ? ?Induction: Intravenous ? ?PONV Risk Score and Plan: 1 and Propofol infusion, TIVA and Treatment may vary due to age or medical condition ? ?Airway Management Planned: Natural Airway, Nasal Cannula and Simple Face Mask ? ?Additional Equipment: None ? ?Intra-op Plan:  ? ?Post-operative Plan:  ? ?Informed Consent: I have reviewed the patients History and Physical, chart, labs and discussed the procedure including the risks, benefits and alternatives for the proposed anesthesia with the patient or authorized representative who has indicated his/her understanding and acceptance.  ? ? ? ? ? ?Plan Discussed with:  ? ?Anesthesia Plan Comments:   ? ? ? ? ? ? ?Anesthesia  Quick Evaluation ? ?

## 2021-11-27 ENCOUNTER — Encounter (HOSPITAL_BASED_OUTPATIENT_CLINIC_OR_DEPARTMENT_OTHER): Payer: Self-pay | Admitting: Orthopedic Surgery

## 2021-12-06 ENCOUNTER — Encounter: Payer: Medicare PPO | Admitting: Diagnostic Neuroimaging

## 2021-12-06 ENCOUNTER — Ambulatory Visit (INDEPENDENT_AMBULATORY_CARE_PROVIDER_SITE_OTHER): Payer: Medicare PPO | Admitting: Orthopedic Surgery

## 2021-12-06 ENCOUNTER — Encounter: Payer: Self-pay | Admitting: Orthopedic Surgery

## 2021-12-06 DIAGNOSIS — G5601 Carpal tunnel syndrome, right upper limb: Secondary | ICD-10-CM

## 2021-12-06 NOTE — Progress Notes (Signed)
? ?  Post-Op Visit Note ?  ?Patient: James Ramsey           ?Date of Birth: 10-15-1940           ?MRN: [id number] ?Visit Date: 12/06/2021 ?PCP: Aretta Nip, MD ? ? ?Assessment & Plan: ? ?Chief Complaint:  ?Chief Complaint  ?Patient presents with  ? Right Hand - Routine Post Op  ? ?Visit Diagnoses:  ?1. Carpal tunnel syndrome, right upper limb   ? ? ?Plan: Patient is 10 days s/p R CTR.  He is doing well postop. His nocturnal symptoms have resolved. His incision is clean, dry, and well approximated.  His sutures were removed.  He is interested in release of the left carpal tunnel but would like to wait until this summer.  ? ?Follow-Up Instructions: No follow-ups on file.  ? ?Orders:  ?No orders of the defined types were placed in this encounter. ? ?No orders of the defined types were placed in this encounter. ? ? ?Imaging: ?No results found. ? ?PMFS History: ?Patient Active Problem List  ? Diagnosis Date Noted  ? Carpal tunnel syndrome, right upper limb   ? Paroxysmal atrial fibrillation ([place]) 11/12/2021  ? Hypercholesterolemia 11/12/2021  ? Long term (current) use of anticoagulants 11/12/2021  ? 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 Acute ischemic stroke ([place]) 09/11/2020  ? ?Past Medical History:  ?Diagnosis Date  ? Paroxysmal atrial fibrillation ([place]) 10/29/2020  ? Stroke (cerebrum) ([place])   ?  ?Family History  ?Problem Relation Age of Onset  ? Breast cancer Mother   ? CAD Father   ? High Cholesterol Father   ? Valvular heart disease Father   ?  ?Past Surgical History:  ?Procedure Laterality Date  ? CARPAL TUNNEL RELEASE Right 11/26/2021  ? Procedure: RIGHT CARPAL TUNNEL RELEASE;  Surgeon: Sherilyn Cooter, MD;  Location: [facility];  Service: Orthopedics;  Laterality: Right;  ? NO PAST SURGERIES    ? ?Social History  ? ?Occupational History  ? Not on file  ?Tobacco Use  ? Smoking status: Never  ? Smokeless tobacco: Never  ?Substance and Sexual Activity  ? Alcohol use: Yes  ?   Comment: occ  ? Drug use: Never  ? Sexual activity: Not on file  ? ? ? ?

## 2021-12-17 DIAGNOSIS — E7800 Pure hypercholesterolemia, unspecified: Secondary | ICD-10-CM | POA: Diagnosis not present

## 2021-12-17 DIAGNOSIS — Z7901 Long term (current) use of anticoagulants: Secondary | ICD-10-CM | POA: Diagnosis not present

## 2021-12-17 DIAGNOSIS — Z0000 Encounter for general adult medical examination without abnormal findings: Secondary | ICD-10-CM | POA: Diagnosis not present

## 2021-12-17 DIAGNOSIS — Z8673 Personal history of transient ischemic attack (TIA), and cerebral infarction without residual deficits: Secondary | ICD-10-CM | POA: Diagnosis not present

## 2021-12-17 DIAGNOSIS — Z125 Encounter for screening for malignant neoplasm of prostate: Secondary | ICD-10-CM | POA: Diagnosis not present

## 2021-12-17 DIAGNOSIS — I480 Paroxysmal atrial fibrillation: Secondary | ICD-10-CM | POA: Diagnosis not present

## 2021-12-17 LAB — LAB REPORT - SCANNED: EGFR: 75

## 2022-02-15 ENCOUNTER — Other Ambulatory Visit: Payer: Self-pay | Admitting: Internal Medicine

## 2022-02-15 DIAGNOSIS — I480 Paroxysmal atrial fibrillation: Secondary | ICD-10-CM

## 2022-02-15 NOTE — Telephone Encounter (Addendum)
Prescription refill request for Eliquis received.   Indication: afib  Last office visit: Eulas Post 11/13/2021 Scr: 1.02, 12/17/2021 Age: 81 yo  Weight: 93.1 kg    Refill sent.

## 2022-03-07 ENCOUNTER — Other Ambulatory Visit: Payer: Self-pay | Admitting: Urology

## 2022-03-07 DIAGNOSIS — R972 Elevated prostate specific antigen [PSA]: Secondary | ICD-10-CM

## 2022-03-20 ENCOUNTER — Ambulatory Visit
Admission: RE | Admit: 2022-03-20 | Discharge: 2022-03-20 | Disposition: A | Discharge status: Home / Self Care | Payer: Medicare PPO | Source: Ambulatory Visit | Attending: Urology | Admitting: Urology

## 2022-03-20 DIAGNOSIS — R972 Elevated prostate specific antigen [PSA]: Secondary | ICD-10-CM

## 2022-03-20 MED ORDER — GADOBENATE DIMEGLUMINE 529 MG/ML IV SOLN
19.0000 mL | Freq: Once | INTRAVENOUS | Status: AC | PRN
  Administered 2022-03-20: 19 mL via INTRAVENOUS

## 2022-04-01 ENCOUNTER — Other Ambulatory Visit (HOSPITAL_COMMUNITY): Payer: Self-pay | Admitting: Urology

## 2022-04-01 ENCOUNTER — Telehealth: Payer: Self-pay | Admitting: Internal Medicine

## 2022-04-01 ENCOUNTER — Other Ambulatory Visit: Payer: Self-pay | Admitting: Urology

## 2022-04-01 DIAGNOSIS — R972 Elevated prostate specific antigen [PSA]: Secondary | ICD-10-CM

## 2022-04-01 NOTE — Telephone Encounter (Signed)
Pharmacy please advise on Eliquis for patient's upcoming ultrasound with prostate biopsy scheduled for 04/24/2022.  Thank you

## 2022-04-01 NOTE — Telephone Encounter (Signed)
Anticoagulation has been addressed.  Preoperative team, please contact this patient and set up a phone call appointment for further cardiac evaluation.  Thank you for your help.  Jossie Ng. Maliya Marich NP-C    04/01/2022, 2:49 PM [place] [address] Office [phone] Fax [phone]

## 2022-04-01 NOTE — Telephone Encounter (Signed)
   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 Ellison Hughs  Surgeon's Group or Practice Name:  [facility] Phone number:  [phone] Fax number:  [phone]   Type of Clearance Requested:   - Medical  - Pharmacy:  Hold Apixaban (Eliquis) 3 days prior    Type of Anesthesia:  General    Additional requests/questions:    Dorthey Sawyer   04/01/2022, 10:09 AM

## 2022-04-01 NOTE — Telephone Encounter (Addendum)
Patient with diagnosis of afib on Eliquis for anticoagulation.    Procedure: Trans rectal ultrasound with prostate biopsy and possible trans urethral resection of bladder tumor  Date of procedure: 04/24/22  CHA2DS2-VASc Score = 6  This indicates a 9.7% annual risk of stroke. The patient's score is based upon: CHF History: 0 HTN History: 1 Diabetes History: 0 Stroke History: 2 Vascular Disease History: 1 Age Score: 2 Gender Score: 0  CrCl 66 mL/min using AdjBW Platelet count 219K  Patient had a stroke prior to being diagnosed with afib. MD is requesting a 3 day hold. Procedure is high bleed risk but patient is high clot risk due to hx of stroke. Will defer to Dr. Rayann Heman.

## 2022-04-01 NOTE — Telephone Encounter (Signed)
Left message for the pt to call the office for tele pre op appt

## 2022-04-02 ENCOUNTER — Telehealth: Payer: Self-pay | Admitting: *Deleted

## 2022-04-02 NOTE — Telephone Encounter (Signed)
Pt agreeable to plan of care for pre op tele appt 04/09/22 @ 2:20. Med rec and consent are done.

## 2022-04-02 NOTE — Telephone Encounter (Signed)
Pt agreeable to plan of care for pre op tele appt 04/09/22 @ 2:20. Med rec and consent are done.     Patient Consent for Virtual Visit        Nathan Moctezuma has provided verbal consent on 04/02/2022 for a virtual visit (video or telephone).   CONSENT FOR VIRTUAL VISIT FOR:  Joseph Pierini  By participating in this virtual visit I agree to the following:  I hereby voluntarily request, consent and authorize [facility] and its employed or contracted physicians, physician assistants, nurse practitioners or other licensed health care professionals (the Practitioner), to provide me with telemedicine health care services (the "Services") as deemed necessary by the treating Practitioner. I acknowledge and consent to receive the Services by the Practitioner via telemedicine. I understand that the telemedicine visit will involve communicating with the Practitioner through live audiovisual communication technology and the disclosure of certain medical information by electronic transmission. I acknowledge that I have been given the opportunity to request an in-person assessment or other available alternative prior to the telemedicine visit and am voluntarily participating in the telemedicine visit.  I understand that I have the right to withhold or withdraw my consent to the use of telemedicine in the course of my care at any time, without affecting my right to future care or treatment, and that the Practitioner or I may terminate the telemedicine visit at any time. I understand that I have the right to inspect all information obtained and/or recorded in the course of the telemedicine visit and may receive copies of available information for a reasonable fee.  I understand that some of the potential risks of receiving the Services via telemedicine include:  Delay or interruption in medical evaluation due to technological equipment failure or disruption; Information transmitted may not be  sufficient (e.g. poor resolution of images) to allow for appropriate medical decision making by the Practitioner; and/or  In rare instances, security protocols could fail, causing a breach of personal health information.  Furthermore, I acknowledge that it is my responsibility to provide information about my medical history, conditions and care that is complete and accurate to the best of my ability. I acknowledge that Practitioner's advice, recommendations, and/or decision may be based on factors not within their control, such as incomplete or inaccurate data provided by me or distortions of diagnostic images or specimens that may result from electronic transmissions. I understand that the practice of medicine is not an exact science and that Practitioner makes no warranties or guarantees regarding treatment outcomes. I acknowledge that a copy of this consent can be made available to me via my patient portal ([facility]), or I can request a printed copy by calling the office of [facility].    I understand that my insurance will be billed for this visit.   I have read or had this consent read to me. I understand the contents of this consent, which adequately explains the benefits and risks of the Services being provided via telemedicine.  I have been provided ample opportunity to ask questions regarding this consent and the Services and have had my questions answered to my satisfaction. I give my informed consent for the services to be provided through the use of telemedicine in my medical care

## 2022-04-08 NOTE — Telephone Encounter (Signed)
Patient has an tele visit scheduled for 8/8 at 2:30 pm

## 2022-04-08 NOTE — Progress Notes (Unsigned)
Virtual Visit via Telephone Note   Because of James Ramsey's co-morbid illnesses, he is at least at moderate risk for complications without adequate follow up.  This format is felt to be most appropriate for this patient at this time.  The patient did not have access to video technology/had technical difficulties with video requiring transitioning to audio format only (telephone).  All issues noted in this document were discussed and addressed.  No physical exam could be performed with this format.  Please refer to the patient's chart for his consent to telehealth for [facility].  Evaluation Performed:  Preoperative cardiovascular risk assessment _____________   Date:  04/08/2022   Patient ID:  James Ramsey, DOB September 17, 1940, MRN [id number] Patient Location:  Home Provider location:   Office  Primary Care Provider:  Aretta Nip, MD Primary Cardiologist:  None  Chief Complaint / Patient Profile   81 y.o. y/o male with a h/o PAF,secondary hypercoagulable state, and stroke who is pending transrectal ultrasound with prostate biopsy and possible transurethral resection of bladder tumor and presents today for telephonic preoperative cardiovascular risk assessment.  Past Medical History    Past Medical History:  Diagnosis Date   Paroxysmal atrial fibrillation ([place]) 10/29/2020   Stroke (cerebrum) [facility])    Past Surgical History:  Procedure Laterality Date   CARPAL TUNNEL RELEASE Right 11/26/2021   Procedure: RIGHT CARPAL TUNNEL RELEASE;  Surgeon: Sherilyn Cooter, MD;  Location: [place] [facility];  Service: Orthopedics;  Laterality: Right;   NO PAST SURGERIES      Allergies  No Known Allergies  History of Present Illness    James Ramsey is a 81 y.o. male who presents via audio/video conferencing for a telehealth visit today.  Pt was last seen in cardiology clinic on 12/13/20 by Dr. Rayann Heman.  At that time Elex Mainwaring was  doing well.  The patient is now pending procedure as outlined above. Since his last visit, he denies chest pain, shortness of breath, lower extremity edema, fatigue, palpitations, melena, hematuria, hemoptysis, diaphoresis, weakness, presyncope, syncope, orthopnea, and PND.  Home Medications    Prior to Admission medications   Medication Sig Start Date End Date Taking? Authorizing Provider  Acetylcarnitine HCl (ACETYL-L-CARNITINE HCL) POWD 300 mg by Does not apply route daily. Patient not taking: Reported on 11/12/2021    [provider]  Calcium Carb-Cholecalciferol 600-20 MG-MCG TABS Take 1 tablet by mouth 2 (two) times a week.    [provider]  Coenzyme Q10 200 MG capsule Take 200 mg by mouth daily.    [provider]  Cyanocobalamin (B-12) 250 MCG TABS Take 250 mcg by mouth daily.    [provider]  D-RIBOSE PO Take 500 mg by mouth daily. Patient not taking: Reported on 11/12/2021    [provider]  ELIQUIS 5 MG TABS tablet TAKE 1 TABLET BY MOUTH TWICE A DAY 02/15/22   Allred, Jeneen Rinks, MD  magnesium gluconate (MAGONATE) 500 MG tablet Take 500 mg by mouth daily.    [provider]  MEDIUM CHAIN TRIGLYCERIDES PO Take 130 mg by mouth daily.    [provider]  metoprolol succinate (TOPROL XL) 25 MG 24 hr tablet Take 0.5 tablets (12.5 mg total) by mouth as needed (as needed daily for Afib). 12/13/20   Allred, Jeneen Rinks, MD  Nattokinase 100 MG CAPS Take 100 mg by mouth daily.    [provider]  Omega 3 1200 MG CAPS Take 1 capsule by mouth  daily.    [provider]  OVER THE COUNTER MEDICATION Macular Supplement 1 tablet daily    [provider]  pyridOXINE (VITAMIN B-6) 100 MG tablet Take 100 mg by mouth daily.    [provider]  thiamine 100 MG tablet Take 100 mg by mouth daily.    [provider]    Physical Exam    Vital Signs:  James Ramsey does not have vital signs  available for review today.  Given telephonic nature of communication, physical exam is limited. AAOx3. NAD. Normal affect.  Speech and respirations are unlabored.  Accessory Clinical Findings    None  Assessment & Plan    1.  Preoperative Cardiovascular Risk Assessment:The patient is doing well from a cardiac perspective. Therefore, based on ACC/AHA guidelines, the patient would be at acceptable risk for the planned procedure without further cardiovascular testing. The patient was advised that if he develops new symptoms prior to surgery to contact our office to arrange for a follow-up visit, and he verbalized understanding. According to the Revised Cardiac Risk Index (RCRI), his Perioperative Risk of Major Cardiac Event is (%): 6.6. His Functional Capacity in METs is: 7.59 according to the Duke Activity Status Index (DASI). Patient may hold Eliquis for 3 days prior to procedure  A copy of this note will be routed to requesting surgeon.  Time:   Today, I have spent 9 minutes with the patient with telehealth technology discussing medical history, symptoms, and management plan.    Emmaline Life, NP-C    04/08/2022, 4:58 PM [place] [address], [address] Office [phone] Fax [phone]

## 2022-04-09 ENCOUNTER — Encounter: Payer: Self-pay | Admitting: Nurse Practitioner

## 2022-04-09 ENCOUNTER — Ambulatory Visit (INDEPENDENT_AMBULATORY_CARE_PROVIDER_SITE_OTHER): Payer: Medicare PPO | Admitting: Nurse Practitioner

## 2022-04-09 DIAGNOSIS — Z01810 Encounter for preprocedural cardiovascular examination: Secondary | ICD-10-CM

## 2022-04-12 ENCOUNTER — Encounter (HOSPITAL_BASED_OUTPATIENT_CLINIC_OR_DEPARTMENT_OTHER): Payer: Self-pay | Admitting: Urology

## 2022-04-12 NOTE — Progress Notes (Signed)
Spoke w/ via phone for pre-op interview--- James Ramsey Lab needs dos----   EKG            Lab results------ COVID test -----patient states asymptomatic no test needed Arrive at -------0900 NPO after MN NO Solid Food.  Clear liquids from MN until---0800 Med rec completed Medications to take morning of surgery -----NONE Diabetic medication ----- Patient instructed no nail polish to be worn day of surgery Patient instructed to bring photo id and insurance card day of surgery Patient aware to have Driver (ride ) / caregiver Wife James Ramsey   for 24 hours after surgery  Patient Special Instructions ----- Pre-Op special Istructions ----- Patient verbalized understanding of instructions that were given at this phone interview. Patient denies shortness of breath, chest pain, fever, cough at this phone interview.    *Cardiac clearance dated 04/09/2022. Eliquis instructions included in clearance hold for 3 days prior. Patient verbalized that he had discussed these instructions with his cardiologist and understands the instructions.

## 2022-04-24 ENCOUNTER — Encounter (HOSPITAL_BASED_OUTPATIENT_CLINIC_OR_DEPARTMENT_OTHER)
Admission: RE | Disposition: A | Discharge status: Home / Self Care | Payer: Self-pay | Source: Home / Self Care | Attending: Urology

## 2022-04-24 ENCOUNTER — Encounter (HOSPITAL_BASED_OUTPATIENT_CLINIC_OR_DEPARTMENT_OTHER): Payer: Self-pay | Admitting: Urology

## 2022-04-24 ENCOUNTER — Other Ambulatory Visit: Payer: Self-pay

## 2022-04-24 ENCOUNTER — Ambulatory Visit (HOSPITAL_BASED_OUTPATIENT_CLINIC_OR_DEPARTMENT_OTHER)
Admission: RE | Admit: 2022-04-24 | Discharge: 2022-04-24 | Disposition: A | Discharge status: Home / Self Care | Payer: Medicare PPO | Attending: Urology | Admitting: Urology

## 2022-04-24 ENCOUNTER — Ambulatory Visit (HOSPITAL_BASED_OUTPATIENT_CLINIC_OR_DEPARTMENT_OTHER): Payer: Medicare PPO | Admitting: Certified Registered Nurse Anesthetist

## 2022-04-24 ENCOUNTER — Ambulatory Visit (HOSPITAL_COMMUNITY)
Admission: RE | Admit: 2022-04-24 | Discharge: 2022-04-24 | Disposition: A | Discharge status: Home / Self Care | Payer: Medicare PPO | Source: Ambulatory Visit | Attending: Urology | Admitting: Urology

## 2022-04-24 DIAGNOSIS — N329 Bladder disorder, unspecified: Secondary | ICD-10-CM | POA: Diagnosis not present

## 2022-04-24 DIAGNOSIS — N35811 Other urethral stricture, male, meatal: Secondary | ICD-10-CM

## 2022-04-24 DIAGNOSIS — N35911 Unspecified urethral stricture, male, meatal: Secondary | ICD-10-CM | POA: Insufficient documentation

## 2022-04-24 DIAGNOSIS — I4891 Unspecified atrial fibrillation: Secondary | ICD-10-CM

## 2022-04-24 DIAGNOSIS — N3289 Other specified disorders of bladder: Secondary | ICD-10-CM

## 2022-04-24 DIAGNOSIS — Z7901 Long term (current) use of anticoagulants: Secondary | ICD-10-CM | POA: Diagnosis not present

## 2022-04-24 DIAGNOSIS — I480 Paroxysmal atrial fibrillation: Secondary | ICD-10-CM | POA: Insufficient documentation

## 2022-04-24 DIAGNOSIS — C61 Malignant neoplasm of prostate: Secondary | ICD-10-CM | POA: Diagnosis not present

## 2022-04-24 DIAGNOSIS — R972 Elevated prostate specific antigen [PSA]: Secondary | ICD-10-CM

## 2022-04-24 HISTORY — PX: TRANSURETHRAL RESECTION OF BLADDER TUMOR: SHX2575

## 2022-04-24 HISTORY — PX: PROSTATE BIOPSY: SHX241

## 2022-04-24 HISTORY — DX: Cardiac arrhythmia, unspecified: I49.9

## 2022-04-24 SURGERY — BIOPSY, PROSTATE, RECTAL APPROACH, WITH US GUIDANCE
Anesthesia: General

## 2022-04-24 MED ORDER — ONDANSETRON HCL 4 MG/2ML IJ SOLN: 4.0000 mg | Freq: Once | INTRAMUSCULAR | Status: DC | PRN

## 2022-04-24 MED ORDER — LACTATED RINGERS IV SOLN: INTRAVENOUS | Status: DC

## 2022-04-24 MED ORDER — FENTANYL CITRATE (PF) 250 MCG/5ML IJ SOLN
INTRAMUSCULAR | Status: DC | PRN
  Administered 2022-04-24: 50 ug via INTRAVENOUS

## 2022-04-24 MED ORDER — LIDOCAINE HCL 2 % IJ SOLN
INTRAMUSCULAR | Status: AC
  Filled 2022-04-24: qty 20

## 2022-04-24 MED ORDER — ACETAMINOPHEN 500 MG PO TABS
ORAL_TABLET | ORAL | Status: AC
  Filled 2022-04-24: qty 2

## 2022-04-24 MED ORDER — SODIUM CHLORIDE 0.9 % IR SOLN
Status: DC | PRN
  Administered 2022-04-24: 3000 mL

## 2022-04-24 MED ORDER — ROCURONIUM BROMIDE 10 MG/ML (PF) SYRINGE
PREFILLED_SYRINGE | INTRAVENOUS | Status: DC | PRN
  Administered 2022-04-24: 50 mg via INTRAVENOUS

## 2022-04-24 MED ORDER — OXYBUTYNIN CHLORIDE 5 MG PO TABS: 5.0000 mg | ORAL_TABLET | Freq: Three times a day (TID) | ORAL | 1 refills | Status: DC | PRN

## 2022-04-24 MED ORDER — LIDOCAINE 2% (20 MG/ML) 5 ML SYRINGE
INTRAMUSCULAR | Status: DC | PRN
  Administered 2022-04-24: 60 mg via INTRAVENOUS

## 2022-04-24 MED ORDER — CIPROFLOXACIN IN D5W 400 MG/200ML IV SOLN
400.0000 mg | Freq: Once | INTRAVENOUS | Status: AC
  Administered 2022-04-24: 400 mg via INTRAVENOUS

## 2022-04-24 MED ORDER — FENTANYL CITRATE (PF) 100 MCG/2ML IJ SOLN: 25.0000 ug | INTRAMUSCULAR | Status: DC | PRN

## 2022-04-24 MED ORDER — SUGAMMADEX SODIUM 200 MG/2ML IV SOLN
INTRAVENOUS | Status: DC | PRN
  Administered 2022-04-24: 200 mg via INTRAVENOUS

## 2022-04-24 MED ORDER — ONDANSETRON HCL 4 MG/2ML IJ SOLN
INTRAMUSCULAR | Status: DC | PRN
  Administered 2022-04-24: 4 mg via INTRAVENOUS

## 2022-04-24 MED ORDER — LIDOCAINE HCL URETHRAL/MUCOSAL 2 % EX GEL
CUTANEOUS | Status: AC
  Filled 2022-04-24: qty 11

## 2022-04-24 MED ORDER — OXYCODONE HCL 5 MG/5ML PO SOLN: 5.0000 mg | Freq: Once | ORAL | Status: DC | PRN

## 2022-04-24 MED ORDER — ACETAMINOPHEN 500 MG PO TABS
1000.0000 mg | ORAL_TABLET | Freq: Once | ORAL | Status: AC
  Administered 2022-04-24: 1000 mg via ORAL

## 2022-04-24 MED ORDER — ONDANSETRON HCL 4 MG/2ML IJ SOLN
INTRAMUSCULAR | Status: AC
  Filled 2022-04-24: qty 2

## 2022-04-24 MED ORDER — CIPROFLOXACIN IN D5W 400 MG/200ML IV SOLN
INTRAVENOUS | Status: AC
  Filled 2022-04-24: qty 200

## 2022-04-24 MED ORDER — PHENAZOPYRIDINE HCL 200 MG PO TABS: 200.0000 mg | ORAL_TABLET | Freq: Three times a day (TID) | ORAL | 0 refills | Status: DC | PRN

## 2022-04-24 MED ORDER — CEPHALEXIN 500 MG PO CAPS: 500.0000 mg | ORAL_CAPSULE | Freq: Two times a day (BID) | ORAL | 0 refills | Status: AC

## 2022-04-24 MED ORDER — EPHEDRINE SULFATE (PRESSORS) 50 MG/ML IJ SOLN
INTRAMUSCULAR | Status: DC | PRN
  Administered 2022-04-24: 10 mg via INTRAVENOUS

## 2022-04-24 MED ORDER — OXYCODONE HCL 5 MG PO TABS: 5.0000 mg | ORAL_TABLET | Freq: Once | ORAL | Status: DC | PRN

## 2022-04-24 MED ORDER — PROPOFOL 10 MG/ML IV BOLUS
INTRAVENOUS | Status: AC
  Filled 2022-04-24: qty 20

## 2022-04-24 MED ORDER — PROPOFOL 10 MG/ML IV BOLUS
INTRAVENOUS | Status: DC | PRN
  Administered 2022-04-24: 140 mg via INTRAVENOUS

## 2022-04-24 MED ORDER — FENTANYL CITRATE (PF) 100 MCG/2ML IJ SOLN
INTRAMUSCULAR | Status: AC
  Filled 2022-04-24: qty 2

## 2022-04-24 MED ORDER — TRAMADOL HCL 50 MG PO TABS
50.0000 mg | ORAL_TABLET | Freq: Four times a day (QID) | ORAL | 0 refills | Status: AC | PRN
Start: 2022-04-24 — End: 2022-04-27

## 2022-04-24 SURGICAL SUPPLY — 32 items
BAG DRAIN URO-CYSTO SKYTR STRL (DRAIN) ×1 IMPLANT
BAG DRN RND TRDRP ANRFLXCHMBR (UROLOGICAL SUPPLIES) ×1
BAG DRN UROCATH (DRAIN) ×1
BAG URINE DRAIN 2000ML AR STRL (UROLOGICAL SUPPLIES) IMPLANT
BAG URINE LEG 500ML (DRAIN) IMPLANT
CATH FOLEY 2WAY SLVR  5CC 18FR (CATHETERS) ×1
CATH FOLEY 2WAY SLVR 5CC 18FR (CATHETERS) IMPLANT
CLOTH BEACON ORANGE TIMEOUT ST (SAFETY) ×1 IMPLANT
GLOVE BIO SURGEON STRL SZ7.5 (GLOVE) ×1 IMPLANT
GOWN STRL REUS W/TWL XL LVL3 (GOWN DISPOSABLE) ×1 IMPLANT
HOLDER FOLEY CATH W/STRAP (MISCELLANEOUS) IMPLANT
INST BIOPSY MAXCORE 18GX25 (NEEDLE) IMPLANT
INSTR BIOPSY MAXCORE 18GX20 (NEEDLE) IMPLANT
IV NS IRRIG 3000ML ARTHROMATIC (IV SOLUTION) ×1 IMPLANT
KIT TURNOVER CYSTO (KITS) ×1 IMPLANT
LOOP CUT BIPOLAR 24F LRG (ELECTROSURGICAL) IMPLANT
MANIFOLD NEPTUNE II (INSTRUMENTS) ×1 IMPLANT
NDL SAFETY ECLIPSE 18X1.5 (NEEDLE) IMPLANT
NDL SPNL 22GX7 QUINCKE BK (NEEDLE) ×1 IMPLANT
NEEDLE HYPO 18GX1.5 SHARP (NEEDLE)
NEEDLE SPNL 22GX7 QUINCKE BK (NEEDLE) ×1 IMPLANT
NS IRRIG 500ML POUR BTL (IV SOLUTION) ×1 IMPLANT
PACK CYSTO (CUSTOM PROCEDURE TRAY) ×1 IMPLANT
PAD PREP 24X48 CUFFED NSTRL (MISCELLANEOUS) ×1 IMPLANT
SYR CONTROL 10ML LL (SYRINGE) IMPLANT
SYR TOOMEY IRRIG 70ML (MISCELLANEOUS)
SYRINGE TOOMEY IRRIG 70ML (MISCELLANEOUS) IMPLANT
TUBE CONNECTING 12X1/4 (SUCTIONS) ×1 IMPLANT
TUBING UROLOGY SET (TUBING) ×1 IMPLANT
UNDERPAD 30X36 HEAVY ABSORB (UNDERPADS AND DIAPERS) ×2 IMPLANT
WATER STERILE IRR 3000ML UROMA (IV SOLUTION) IMPLANT
WATER STERILE IRR 500ML POUR (IV SOLUTION) IMPLANT

## 2022-04-24 NOTE — Op Note (Signed)
Operative Note  Preoperative diagnosis:  1.  Elevated PSA 2.  Bladder mass  Postoperative diagnosis: 1.  Elevated PSA 2.  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  Meatal stenosis  Procedure(s): 1.  Transrectal ultrasound-guided prostate biopsy 2.  Transurethral resection of bladder tumor (medium)  Surgeon: Ellison Hughs, MD  Assistants:  None  Anesthesia:  General  Complications:  None  EBL: 10 mL  Specimens: 1.  12 prostate biopsy specimens from each sextant 2.  Anterior bladder wall mass  Drains/Catheters: 1.  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 male with an elevated PSA.  He underwent a prostate MRI that was found to have multiple PI-RADS 5 lesions as well as a solid enhancing bladder mass.  He has been consented for the above procedures, voices understanding and wishes to proceed.  Description of procedure:  Prostate Biopsy Procedure   Informed consent was obtained after discussing risks/benefits of the procedure.  A time out was performed to ensure correct patient identity.  Pre-Procedure: - Last PSA Level: 7.73 -Transrectal Ultrasound performed revealing a 25.9 gm prostate -No significant hypoechoic or median lobe noted  Procedure: - Prostate block performed using 10 cc 1% lidocaine and biopsies taken from sextant areas, a total of 12 under ultrasound guidance.  The patient was then repositioned in the dorsal lithotomy position and prepped and draped in usual sterile fashion.  Another timeout was then performed.  A 23 French rigid cystoscope was then inserted into the urethra and advanced into the bladder.  A complete bladder survey revealed a 2 cm papillary bladder tumor involving the right  anterior bladder dome.  No other intravesical abnormalities were seen.  I then attempted to insert a 26 French resectoscope, but met resistance at the urethral meatus.  The obturator of the resectoscope was then used to dilate the meatus.  I was then able to insert the resectoscope with minimal trauma.  I then used a bipolar loop working element to resect the anterior bladder dome tumor down to the detrusor musculature.  There is no evidence of bladder perforation.  All specimens were then siphoned out of the bladder through the sheath of the cystoscope.  The area of resection was then extensively fulgurated until hemostasis was achieved.  The resectoscope was then removed.  I placed an 81 French Foley catheter due to a small degree of trauma to the urethral meatus from his dilation.  His bladder was then drained with return of slightly blood-tinged urine.  He tolerated the procedure well and was transferred to the postanesthesia in stable condition.  Plan: The patient has been instructed to remove his Foley catheter at 7 AM on 04/26/2022.  Follow-up on 05/02/2022 to discuss pathology results

## 2022-04-24 NOTE — Anesthesia Preprocedure Evaluation (Addendum)
Anesthesia Evaluation  Patient identified by MRN, date of birth, ID band Patient awake    Reviewed: Allergy & Precautions, NPO status , Patient's Chart, lab work & pertinent test results  History of Anesthesia Complications Negative for: history of anesthetic complications  Airway Mallampati: II  TM Distance: >3 FB Neck ROM: Full    Dental  (+) Dental Advisory Given, Teeth Intact   Pulmonary neg pulmonary ROS,    Pulmonary exam normal        Cardiovascular + dysrhythmias Atrial Fibrillation  Rhythm:Regular Rate:Bradycardia   '22 TTE - EF 65 to 70%. There is mild left ventricular hypertrophy. Grade I diastolic dysfunction (impaired relaxation). Trivial mitral valve regurgitation. Mild aortic valve sclerosis    Neuro/Psych CVA, No Residual Symptoms negative psych ROS   GI/Hepatic negative GI ROS, Neg liver ROS,   Endo/Other  negative endocrine ROS  Renal/GU negative Renal ROS     Musculoskeletal negative musculoskeletal ROS (+)   Abdominal   Peds  Hematology  On eliquis    Anesthesia Other Findings   Reproductive/Obstetrics                            Anesthesia Physical Anesthesia Plan  ASA: 3  Anesthesia Plan: General   Post-op Pain Management: Tylenol PO (pre-op)*   Induction: Intravenous  PONV Risk Score and Plan: 2 and Treatment may vary due to age or medical condition and Ondansetron  Airway Management Planned: LMA and Oral ETT  Additional Equipment: None  Intra-op Plan:   Post-operative Plan: Extubation in OR  Informed Consent: I have reviewed the patients History and Physical, chart, labs and discussed the procedure including the risks, benefits and alternatives for the proposed anesthesia with the patient or authorized representative who has indicated his/her understanding and acceptance.     Dental advisory given  Plan Discussed with: CRNA and  Anesthesiologist  Anesthesia Plan Comments: (LMA vs ETT pending surgeon's need for relaxation )      Anesthesia Quick Evaluation

## 2022-04-24 NOTE — Anesthesia Postprocedure Evaluation (Signed)
Anesthesia Post Note  Patient: James Ramsey  Procedure(s) Performed: BIOPSY TRANSRECTAL ULTRASONIC PROSTATE (TUBP) POSSIBLE TRANSURETHRAL RESECTION OF BLADDER TUMOR (TURBT)     Patient location during evaluation: PACU Anesthesia Type: General Level of consciousness: awake and alert Pain management: pain level controlled Vital Signs Assessment: post-procedure vital signs reviewed and stable Respiratory status: spontaneous breathing, nonlabored ventilation and respiratory function stable Cardiovascular status: stable, blood pressure returned to baseline and bradycardic Anesthetic complications: no   No notable events documented.  Last Vitals:  Vitals:   04/24/22 1230 04/24/22 1330  BP: 134/80 (!) 142/76  Pulse: (!) 55 (!) 46  Resp: 11 16  Temp:  36.4 C  SpO2: 92% 96%    Last Pain:  Vitals:   04/24/22 1330  TempSrc:   PainSc: 0-No pain                 Audry Pili

## 2022-04-24 NOTE — Transfer of Care (Signed)
Immediate Anesthesia Transfer of Care Note  Patient: James Ramsey  Procedure(s) Performed: Procedure(s) (LRB): BIOPSY TRANSRECTAL ULTRASONIC PROSTATE (TUBP) (N/A) POSSIBLE TRANSURETHRAL RESECTION OF BLADDER TUMOR (TURBT) (N/A)  Patient Location: PACU  Anesthesia Type: General  Level of Consciousness: awake, alert  and oriented  Airway & Oxygen Therapy: Patient Spontanous Breathing   Post-op Assessment: Report given to PACU RN and Post -op Vital signs reviewed and stable  Post vital signs: Reviewed and stable  Complications: No apparent anesthesia complications  Last Vitals:  Vitals Value Taken Time  BP 155/87 04/24/22 1207  Temp    Pulse 57 04/24/22 1211  Resp 9 04/24/22 1211  SpO2 94 % 04/24/22 1211  Vitals shown include unvalidated device data.  Last Pain:  Vitals:   04/24/22 0930  TempSrc: Oral  PainSc: 0-No pain      Patients Stated Pain Goal: 5 (21/97/58 8325)  Complications: No notable events documented.

## 2022-04-24 NOTE — Anesthesia Procedure Notes (Signed)
Procedure Name: Intubation Date/Time: 04/24/2022 11:09 AM  Performed by: Clearnce Sorrel, CRNAPre-anesthesia Checklist: Patient identified, Emergency Drugs available, Suction available and Patient being monitored Patient Re-evaluated:Patient Re-evaluated prior to induction Oxygen Delivery Method: Circle System Utilized Preoxygenation: Pre-oxygenation with 100% oxygen Induction Type: IV induction Ventilation: Mask ventilation without difficulty Laryngoscope Size: Mac and 4 Grade View: Grade I Tube type: Oral Tube size: 7.5 mm Number of attempts: 1 Airway Equipment and Method: Stylet and Oral airway Placement Confirmation: ETT inserted through vocal cords under direct vision, positive ETCO2 and breath sounds checked- equal and bilateral Secured at: 23 cm Tube secured with: Tape Dental Injury: Teeth and Oropharynx as per pre-operative assessment

## 2022-04-24 NOTE — H&P (Signed)
Urology Preoperative H&P   Chief Complaint: Elevated PSA and bladder mass  History of Present Illness: James Ramsey is a 81 y.o. male referred by James Evener, MD for evaluation of an elevated PSA value. He is currently on Eliquis due a-fib along with a suspected CVA in 2022.  He had an MRI of the prostate on 03/20/2022 that revealed 2 PI-RADS 5 lesions within the prostate with evidence of possible neurovascular invasion as well as a solid and enhancing bladder mass.  He has no urinary complaints today and denies interval UTIs, dysuria or gross hematuria.    Past Medical History:  Diagnosis Date   Dysrhythmia    Paroxysmal atrial fibrillation ([place]) 10/29/2020   Stroke (cerebrum) [facility])     Past Surgical History:  Procedure Laterality Date   CARPAL TUNNEL RELEASE Right 11/26/2021   Procedure: RIGHT CARPAL TUNNEL RELEASE;  Surgeon: Sherilyn Cooter, MD;  Location: [place];  Service: Orthopedics;  Laterality: Right;   NO PAST SURGERIES      Allergies: No Known Allergies  Family History  Problem Relation Age of Onset   Breast cancer Mother    CAD Father    High Cholesterol Father    Valvular heart disease Father     Social History:  reports that he has never smoked. He has never used smokeless tobacco. He reports current alcohol use. He reports that he does not use drugs.  ROS: A complete review of systems was performed.  All systems are negative except for pertinent findings as noted.  Physical Exam:  Vital signs in last 24 hours: Temp:  [98.2 F (36.8 C)] 98.2 F (36.8 C) (08/23 0930) Pulse Rate:  [51] 51 (08/23 0930) Resp:  [16] 16 (08/23 0930) BP: (151)/(79) 151/79 (08/23 0930) SpO2:  [98 %] 98 % (08/23 0930) Weight:  [92.1 kg] 92.1 kg (08/23 0930) Constitutional:  Alert and oriented, No acute distress Cardiovascular: Regular rate and rhythm, No JVD Respiratory: Normal respiratory effort, Lungs clear bilaterally GI: Abdomen is soft,  nontender, nondistended, no abdominal masses GU: No CVA tenderness Lymphatic: No lymphadenopathy Neurologic: Grossly intact, no focal deficits Psychiatric: Normal mood and affect  Laboratory Data:  No results for input(s): "WBC", "HGB", "HCT", "PLT" in the last 72 hours.  No results for input(s): "NA", "K", "CL", "GLUCOSE", "BUN", "CALCIUM", "CREATININE" in the last 72 hours.  Invalid input(s): "CO3"   No results found for this or any previous visit (from the past 24 hour(s)). No results found for this or any previous visit (from the past 240 hour(s)).  Renal Function: No results for input(s): "CREATININE" in the last 168 hours. CrCl cannot be calculated (Patient's most recent lab result is older than the maximum 21 days allowed.).  Radiologic Imaging: No results found.  I independently reviewed the above imaging studies.  Assessment and Plan James Ramsey is a 81 y.o. male with an elevated PSA along with concerning prostatic and bladder lesions on recent MRI  -We discussed the general pros and cons of PSA screening, with the benefits being early cancer detection, hopefully allowing for treatment while curable, and with the cons being anxiety and need for further invasive testing (prostate biopsy) and treatment which may pose more substantial risks. We discussed specific pros/cons in direct relationship to his overall health status and life expectancy.   I extensively discussed the option of prostate biopsy.  I specifically discussed potential risks including but not limited to the possibility of blood per rectum, per urethra and  in the ejaculate, urinary retention and the possibility for UTI/bacteremia. He voices understanding.   The risks, benefits and alternatives of cystoscopy with TURBT was discussed with the patient.  The risks include, but are not limited to, bleeding,  urinary tract infection, bladder perforation requiring prolonged catheterization and/or open  bladder repair, ureteral obstruction, voiding dysfunction and the inherent risks of general anesthesia.  The patient voices understanding and wishes to proceed.   Ellison Hughs, MD 04/24/2022, 10:22 AM  [facility] Pager: [phone]

## 2022-04-24 NOTE — Discharge Instructions (Signed)
       No acetaminophen/Tylenol until after 3:21 pm today if needed.      Post Anesthesia Home Care Instructions  Activity: Get plenty of rest for the remainder of the day. A responsible individual must stay with you for 24 hours following the procedure.  For the next 24 hours, DO NOT: -Drive a car -Paediatric nurse -Drink alcoholic beverages -Take any medication unless instructed by your physician -Make any legal decisions or sign important papers.  Meals: Start with liquid foods such as gelatin or soup. Progress to regular foods as tolerated. Avoid greasy, spicy, heavy foods. If nausea and/or vomiting occur, drink only clear liquids until the nausea and/or vomiting subsides. Call your physician if vomiting continues.  Special Instructions/Symptoms: Your throat may feel dry or sore from the anesthesia or the breathing tube placed in your throat during surgery. If this causes discomfort, gargle with warm salt water. The discomfort should disappear within 24 hours.

## 2022-04-25 LAB — SURGICAL PATHOLOGY

## 2022-04-26 ENCOUNTER — Encounter (HOSPITAL_BASED_OUTPATIENT_CLINIC_OR_DEPARTMENT_OTHER): Payer: Self-pay | Admitting: Urology

## 2022-05-08 ENCOUNTER — Other Ambulatory Visit (HOSPITAL_COMMUNITY): Payer: Self-pay | Admitting: Urology

## 2022-05-08 DIAGNOSIS — C61 Malignant neoplasm of prostate: Secondary | ICD-10-CM

## 2022-05-09 NOTE — Progress Notes (Signed)
Cardiology Office Note Date:  05/09/2022  Patient ID:  James Ramsey, James Ramsey March 05, 1941, MRN [id number] PCP:  Aretta Nip, MD  Electrophysiologist: Dr. Rayann Heman    Chief Complaint:  annual visit  History of Present Illness: James Ramsey is a 81 y.o. male with history of Afib, stroke.  He last saw Dr. Rayann Heman 12/13/20, he was doing well, monitoring his HR via his watch, maintained on [place] and metoprolol planned PRN.   Most recently found with bladder mass, underwent biopsy and resection of bladder mass 04/24/22  TODAY He is doing well Pending a PET scan and oncology follow up He denies any CP, palpitations or cardiac awareness No SOB, DOE No bleeding or signs of bleeding No dizzy spells, near syncope or syncope.  He exercises regularly, walks/jogs 5 days/week, 2-2.5 miles with brisk walking/jogging intervals Reports excellent and unchanged exertional capacity  Has labs done with his PMD  He is not certain that he would know if he had AFib, when they called him while wearing the monitor he had a restless night, felt a little anxious, and that was when he had Afib.  AFib/AAD hx Diagnosed feb 2022 via monitoring done post stroke No AAD to date  Past Medical History:  Diagnosis Date   Dysrhythmia    Paroxysmal atrial fibrillation ([place]) 10/29/2020   Stroke (cerebrum) [facility])     Past Surgical History:  Procedure Laterality Date   CARPAL TUNNEL RELEASE Right 11/26/2021   Procedure: RIGHT CARPAL TUNNEL RELEASE;  Surgeon: Sherilyn Cooter, MD;  Location: [place];  Service: Orthopedics;  Laterality: Right;   NO PAST SURGERIES     PROSTATE BIOPSY N/A 04/24/2022   Procedure: BIOPSY TRANSRECTAL ULTRASONIC PROSTATE (TUBP);  Surgeon: Ceasar Mons, MD;  Location: [facility];  Service: Urology;  Laterality: N/A;  ONLY NEEDS 60 MIN FOR BOTH   TRANSURETHRAL RESECTION OF BLADDER TUMOR N/A 04/24/2022   Procedure: POSSIBLE  TRANSURETHRAL RESECTION OF BLADDER TUMOR (TURBT);  Surgeon: Ceasar Mons, MD;  Location: [facility];  Service: Urology;  Laterality: N/A;    Current Outpatient Medications  Medication Sig Dispense Refill   Calcium Carb-Cholecalciferol 600-20 MG-MCG TABS Take 1 tablet by mouth 2 (two) times a week.     Coenzyme Q10 200 MG capsule Take 200 mg by mouth daily.     Cyanocobalamin (B-12) 250 MCG TABS Take 250 mcg by mouth daily.     D-RIBOSE PO Take 500 mg by mouth daily. (Patient not taking: Reported on 11/12/2021)     ELIQUIS 5 MG TABS tablet TAKE 1 TABLET BY MOUTH TWICE A DAY 60 tablet 5   magnesium gluconate (MAGONATE) 500 MG tablet Take 500 mg by mouth daily.     metoprolol succinate (TOPROL XL) 25 MG 24 hr tablet Take 0.5 tablets (12.5 mg total) by mouth as needed (as needed daily for Afib). 15 tablet 3   Nattokinase 100 MG CAPS Take 100 mg by mouth daily.     Omega 3 1200 MG CAPS Take 1 capsule by mouth daily.     OVER THE COUNTER MEDICATION Macular Supplement 1 tablet daily     oxybutynin (DITROPAN) 5 MG tablet Take 1 tablet (5 mg total) by mouth every 8 (eight) hours as needed for bladder spasms. 30 tablet 1   phenazopyridine (PYRIDIUM) 200 MG tablet Take 1 tablet (200 mg total) by mouth 3 (three) times daily as needed (for pain with urination). 30 tablet 0  pyridOXINE (VITAMIN B-6) 100 MG tablet Take 100 mg by mouth daily.     thiamine 100 MG tablet Take 100 mg by mouth daily.     No current facility-administered medications for this visit.    Allergies:   Patient has no known allergies.   Social History:  The patient  reports that he has never smoked. He has never used smokeless tobacco. He reports current alcohol use. He reports that he does not use drugs.   Family History:  The patient's family history includes Breast cancer in his mother; CAD in his father; High Cholesterol in his father; Valvular heart disease in his father.  ROS:  Please see the  history of present illness.    All other systems are reviewed and otherwise negative.   PHYSICAL EXAM:  VS:  There were no vitals taken for this visit. BMI: There is no height or weight on file to calculate BMI. Well nourished, well developed, in no acute distress HEENT: normocephalic, atraumatic Neck: no JVD, carotid bruits or masses Cardiac:   RRR; no significant murmurs, no rubs, or gallops Lungs:  CTA b/l, no wheezing, rhonchi or rales Abd: soft, nontender MS: no deformity or atrophy Ext: no edema Skin: warm and dry, no rash Neuro:  No gross deficits appreciated Psych: euthymic mood, full affect    EKG:  not done today, reviewed personally EKG done 04/24/22  SB 46bpm, 1st degree [place], PR 222    Recent Labs: No results found for requested labs within last 365 days.  No results found for requested labs within last 365 days.   CrCl cannot be calculated (Patient's most recent lab result is older than the maximum 21 days allowed.).   Wt Readings from Last 3 Encounters:  04/24/22 203 lb (92.1 kg)  11/26/21 205 lb 4 oz (93.1 kg)  10/22/21 208 lb 12.8 oz (94.7 kg)     Other studies reviewed: Additional studies/records reviewed today include: summarized above  ASSESSMENT AND PLAN:  Paroxysmal Afib CHA2DS2Vasc is 5, on Eliquis, appropriately dosed Low/no burden by symptoms  bradycardia HR 60's today No symptoms of bradycardia No changes today  Discussed need for new EP MD  Disposition: F/u with Dr. Quentin Ore in a year, sooner if needed  Current medicines are reviewed at length with the patient today.  The patient did not have any concerns regarding medicines.  Venetia Night, PA-C 05/09/2022 5:58 PM     [place] [address] [place] [address] [phone] (office)  [phone] (fax)

## 2022-05-10 ENCOUNTER — Ambulatory Visit: Payer: Medicare PPO | Attending: Physician Assistant | Admitting: Physician Assistant

## 2022-05-10 ENCOUNTER — Encounter: Payer: Self-pay | Admitting: Physician Assistant

## 2022-05-10 VITALS — BP 136/70 | HR 66 | Ht 71.0 in | Wt 204.0 lb

## 2022-05-10 DIAGNOSIS — R001 Bradycardia, unspecified: Secondary | ICD-10-CM

## 2022-05-10 DIAGNOSIS — I480 Paroxysmal atrial fibrillation: Secondary | ICD-10-CM

## 2022-05-10 NOTE — Patient Instructions (Signed)
Medication Instructions:    Your physician recommends that you continue on your current medications as directed. Please refer to the Current Medication list given to you today.   *If you need a refill on your cardiac medications before your next appointment, please call your pharmacy*   Lab Work: NONE ORDERED  TODAY    If you have labs (blood work) drawn today and your tests are completely normal, you will receive your results only by: MyChart Message (if you have MyChart) OR A paper copy in the mail If you have any lab test that is abnormal or we need to change your treatment, we will call you to review the results.   Testing/Procedures: NONE ORDERED  TODAY      Follow-Up: At [facility], you and your health needs are our priority.  As part of our continuing mission to provide you with exceptional heart care, we have created designated Provider Care Teams.  These Care Teams include your primary Cardiologist (physician) and Advanced Practice Providers (APPs -  Physician Assistants and Nurse Practitioners) who all work together to provide you with the care you need, when you need it.  We recommend signing up for the patient portal called "MyChart".  Sign up information is provided on this After Visit Summary.  MyChart is used to connect with patients for Virtual Visits (Telemedicine).  Patients are able to view lab/test results, encounter notes, upcoming appointments, etc.  Non-urgent messages can be sent to your provider as well.   To learn more about what you can do with MyChart, go to [email or web address].    Your next appointment:   1 year(s)  The format for your next appointment:   In Person  Provider:   Cameron Lambert, MD    Other Instructions   Important Information About Sugar       

## 2022-05-14 NOTE — Progress Notes (Signed)
GU Location of Tumor / Histology: Prostate Ca  If Prostate Cancer, Gleason Score is (4 + 5) and PSA is (7.73 as of 12/2021)  Biopsies     Past/Anticipated interventions by urology, if any:     Past/Anticipated interventions by medical oncology, if any: NA  Weight changes, if any: No  IPPS:  3 SHIM:  19  Bowel/Bladder complaints, if any:  No  Nausea/Vomiting, if any: No  Pain issues, if any:  0/10  SAFETY ISSUES: Prior radiation? No Pacemaker/ICD? No Possible current pregnancy? Male Is the patient on methotrexate? No  Current Complaints / other details:  Need more information on treatment options.

## 2022-05-16 ENCOUNTER — Encounter (HOSPITAL_COMMUNITY)
Admission: RE | Admit: 2022-05-16 | Discharge: 2022-05-16 | Disposition: A | Discharge status: Home / Self Care | Payer: Medicare PPO | Source: Ambulatory Visit | Attending: Urology | Admitting: Urology

## 2022-05-16 DIAGNOSIS — C61 Malignant neoplasm of prostate: Secondary | ICD-10-CM | POA: Insufficient documentation

## 2022-05-16 MED ORDER — PIFLIFOLASTAT F 18 (PYLARIFY) INJECTION
9.0000 | Freq: Once | INTRAVENOUS | Status: AC
  Administered 2022-05-16: 8.36 via INTRAVENOUS

## 2022-05-21 NOTE — Progress Notes (Signed)
Radiation Oncology         [phone] ________________________________  Initial Outpatient Consultation  Name: James Ramsey MRN: [id number]  Date: 05/22/2022  DOB: [birth year]  [id number], James Salinas, MD  James Gourd*   REFERRING PHYSICIAN: Davis Gourd*  DIAGNOSIS: 81 y.o. gentleman with Stage T1c adenocarcinoma of the prostate with Gleason score of 4+5, and PSA of 7.73.    ICD-10-CM   1. Malignant neoplasm of prostate (James Ramsey)  C61       HISTORY OF PRESENT ILLNESS: James Ramsey is a 81 y.o. male with a diagnosis of prostate cancer. He has a history of an elevated, fluctuating PSA since 2019, and the PSA has been persistently elevated and rising since 11/2019. His PSA was 5.03 in 11/2019, 5.61 in 12/2020 and most recently reached 7.73 on 12/17/21 on routine labs with his primary care physician, Dr. Radene Ramsey.  Accordingly, he was referred for evaluation in urology by Dr. Lovena Ramsey on 03/04/22. He underwent prostate MRI on 03/20/22 showing 2  PI-RADS 5 lesions with one at the left paramidline mid gland transitional zone and one at the left posterolateral base with evidence of extracapsular extension and suspected neurovascular bundle involvement. Additionally, there was a nodular enhancing lesion noted at the dome/anterolateral right paramidline of the urinary bladder.  The patient proceeded to transrectal ultrasound with 12 biopsies of the prostate and cystoscopy with TURBT under anesthesia on 04/24/22.  The prostate volume measured 25.9 cc.  Out of 12 core biopsies, 6 were positive, all on the left.  The maximum Gleason score was 4+5 and this was seen in the left mid lateral and left mid (with perineural invasion). Additionally, Gleason 4+4 was seen in the left base lateral and left base (small focus, with PNI), Gleason 4+3 in the left apex, and Gleason 3+4 in the left apex lateral. 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  A staging PSMA PET scan was performed on 05/16/22 showing no evidence of disease outside of the left prostate gland.  The patient reviewed the biopsy results with his urologist and he has kindly been referred today for discussion of potential radiation treatment options.   PREVIOUS RADIATION THERAPY: No  PAST MEDICAL HISTORY:  Past Medical History:  Diagnosis Date   Dysrhythmia    Paroxysmal atrial fibrillation ([place]) 10/29/2020   Stroke (cerebrum) ([place])       PAST SURGICAL HISTORY: Past Surgical History:  Procedure Laterality Date   CARPAL TUNNEL RELEASE Right 11/26/2021   Procedure: RIGHT CARPAL TUNNEL RELEASE;  Surgeon: James Cooter, MD;  Location: [place];  Service: Orthopedics;  Laterality: Right;   NO PAST SURGERIES     PROSTATE BIOPSY N/A 04/24/2022   Procedure: BIOPSY TRANSRECTAL ULTRASONIC PROSTATE (TUBP);  Surgeon: James Mons, MD;  Location: [facility];  Service: Urology;  Laterality: N/A;  ONLY NEEDS 60 MIN FOR BOTH   TRANSURETHRAL RESECTION OF BLADDER TUMOR N/A 04/24/2022   Procedure: POSSIBLE TRANSURETHRAL RESECTION OF BLADDER TUMOR (TURBT);  Surgeon: James Mons, MD;  Location: [facility];  Service: Urology;  Laterality: N/A;    FAMILY HISTORY:  Family History  Problem Relation Age of Onset   Breast cancer Mother    CAD Father    High Cholesterol Father    Valvular heart disease Father     SOCIAL HISTORY:  Social History   Socioeconomic History   Marital status: Married    Spouse name:  Not on file   Number of children: Not on file   Years of education: Not on file   Highest education level: Not on file  Occupational History   Not on file  Tobacco Use   Smoking status: Never   Smokeless tobacco: Never  Substance and Sexual Activity   Alcohol use: Yes    Comment: occ   Drug use: Never   Sexual activity: Not Currently  Other Topics Concern    Not on file  Social History Narrative   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  MEDICATIONS:  Current Outpatient Medications  Medication Sig Dispense Refill   Calcium Carb-Cholecalciferol 600-20 MG-MCG TABS Take 1 tablet by mouth 2 (two) times a week.     Coenzyme Q10 200 MG capsule Take 200 mg by mouth daily.     Cyanocobalamin (B-12) 250 MCG TABS Take 250 mcg by mouth daily.     D-RIBOSE PO Take 500 mg by mouth daily.     ELIQUIS 5 MG TABS tablet TAKE 1 TABLET BY MOUTH TWICE A DAY 60 tablet 5   magnesium gluconate (MAGONATE) 500 MG tablet Take 500 mg by mouth daily.     Nattokinase 100 MG CAPS Take 100 mg by mouth daily.     Omega 3 1200 MG CAPS Take 1 capsule by mouth daily.     OVER THE COUNTER MEDICATION Macular Supplement 1 tablet daily     pyridOXINE (VITAMIN B-6) 100 MG tablet Take 100 mg by mouth daily.     thiamine 100 MG tablet Take 100 mg by mouth daily.     No current facility-administered medications for this encounter.    REVIEW OF SYSTEMS:  On review of systems, the patient reports that he is doing well overall. He denies any chest pain, shortness of breath, cough, fevers, chills, night sweats, unintended weight changes. He denies any bowel disturbances, and denies abdominal pain, nausea or vomiting. He denies any new musculoskeletal or joint aches or pains. His IPSS was 3, indicating mild urinary symptoms. His SHIM was 19, indicating he has mild erectile dysfunction. A complete review of systems is obtained and is otherwise negative.  PHYSICAL EXAM:  Wt Readings from Last 3 Encounters:  05/22/22 206 lb (93.4 kg)  05/10/22 204 lb (92.5 kg)  04/24/22  203 lb (92.1 kg)   Temp Readings from Last 3 Encounters:  05/22/22 97.8 F (36.6 C)  04/24/22 97.6 F (36.4 C)  11/26/21 97.7 F (36.5 C) (Oral)   BP Readings from Last 3 Encounters:  05/22/22 (!) 163/69  05/10/22 136/70  04/24/22 (!) 142/76   Pulse Readings from Last 3 Encounters:  05/22/22 (!) 56  05/10/22 66  04/24/22 (!) 46   Pain Assessment Pain Score: 0-No pain/10  In general this is a well appearing Caucasian male in no acute distress. He's alert and oriented x4 and appropriate throughout the examination. Cardiopulmonary assessment is negative for acute distress, and he exhibits normal effort.     KPS = 100  100 - Normal; no complaints; no evidence of disease. 90   - Able to carry on normal activity; minor signs or symptoms of  disease. 80   - Normal activity with effort; some signs or symptoms of disease. 13   - Cares for self; unable to carry on normal activity or to do active work. 60   - Requires occasional assistance, but is able to care for most of his personal needs. 50   - Requires considerable assistance and frequent medical care. 61   - Disabled; requires special care and assistance. 30   - Severely disabled; hospital admission is indicated although death not imminent. 93   - Very sick; hospital admission necessary; active supportive treatment necessary. 10   - Moribund; fatal processes progressing rapidly. 0     - Dead  Karnofsky DA, Abelmann [place], Craver LS and Burchenal [facility] [phone]) The use of the nitrogen mustards in the palliative treatment of carcinoma: with particular reference to bronchogenic carcinoma Cancer 1 634-56  LABORATORY DATA:  Lab Results  Component Value Date   WBC 10.2 09/11/2020   HGB 16.6 09/11/2020   HCT 47.3 09/11/2020   MCV 91.5 09/11/2020   PLT 219 09/11/2020   Lab Results  Component Value Date   NA 138 09/11/2020   K 4.0 09/11/2020   CL 102 09/11/2020   CO2 24 09/11/2020   Lab Results  Component Value Date   ALT 23  09/11/2020   AST 30 09/11/2020   ALKPHOS 60 09/11/2020   BILITOT 1.1 09/11/2020     RADIOGRAPHY: NM PET (PSMA) SKULL TO MID THIGH  Result Date: 05/20/2022 CLINICAL DATA:  High-risk prostate carcinoma. EXAM: NUCLEAR MEDICINE PET SKULL BASE TO THIGH TECHNIQUE: 8.6 mCi F18 Piflufolastat (Pylarify) was injected intravenously. Full-ring PET imaging was performed from the skull base to thigh after the radiotracer. CT data was obtained and used for attenuation correction and anatomic localization. COMPARISON:  None Available. FINDINGS: NECK No radiotracer activity in neck lymph nodes. Incidental CT finding: None. CHEST No radiotracer accumulation within mediastinal or hilar lymph nodes. No suspicious pulmonary nodules on the CT scan. Incidental CT finding: None. ABDOMEN/PELVIS Prostate: Focal radiotracer accumulation is seen in the left prostate gland, with SUV max of 9.5, consistent with known prostate carcinoma. Lymph nodes: No abnormal radiotracer accumulation within pelvic or abdominal nodes. Liver: No evidence of liver metastasis. Incidental CT finding: Sigmoid diverticulosis, without evidence of diverticulitis. SKELETON No focal activity to suggest skeletal metastasis. IMPRESSION: Focal radiotracer accumulation in the left prostate gland, consistent with known prostate carcinoma. No evidence of metastatic disease. Electronically Signed   By: Marlaine Hind M.D.   On: 05/20/2022 09:03   US Guided Needle Placement  Result Date: 04/24/2022 CLINICAL DATA:  Ultrasound was provided for use by the ordering physician.  No provider Interpretation or professional fees incurred.    [place] Intraoperative  Result Date: 04/24/2022 CLINICAL DATA:  Ultrasound was provided for use by the ordering physician.  No provider Interpretation or professional fees incurred.       IMPRESSION/PLAN: 1. 81 y.o. gentleman with Stage T1c adenocarcinoma of the prostate with Gleason Score of 4+5, and PSA of 7.73. We discussed the  patient's workup and outlined the nature of prostate cancer in this setting. The patient's T stage, Gleason's score, and PSA put him into the high risk group. Accordingly, he is eligible for a variety of potential treatment options including prostatectomy or LT-ADT concurrent with either 8 weeks of external radiation or 5 weeks of external radiation with an upfront brachytherapy boost.  He is not to be an ideal surgical candidate given his advanced age and is not  an ideal brachytherapy candidate given his small prostate volume of 25 gm prior to ADT.  We discussed the available radiation techniques, and focused on the details and logistics of delivery. We discussed and outlined the risks, benefits, short and long-term effects associated with radiotherapy and compared and contrasted these with prostatectomy. We discussed the role of SpaceOAR gel in reducing the rectal toxicity associated with radiotherapy. We also detailed the role of ADT in the treatment of high risk prostate cancer and outlined the associated side effects that could be expected with this therapy.  He was encouraged to ask questions that were answered to his stated satisfaction.  At the conclusion of our conversation, the patient is interested in moving forward with 8 weeks of external beam therapy concurrent with LT-ADT. We will share our discussion with Dr. Lovena Ramsey and make arrangements for a follow up visit, first available, to start ADT now. We will also help to coordinate for fiducial markers and SpaceOAR gel placement in November 2023, prior to simulation, to reduce rectal toxicity from radiotherapy. The patient appears to have a good understanding of his disease and our treatment recommendations which are of curative intent and is in agreement with the stated plan.  Therefore, we will move forward with treatment planning accordingly, in anticipation of beginning IMRT approximately 2 months after starting ADT. We enjoyed meeting him today and  look forward to continuing to participate in his care.  We personally spent 70 minutes in this encounter including chart review, reviewing radiological studies, meeting face-to-face with the patient, entering orders and completing documentation.    Nicholos Johns, PA-C    Tyler Pita, MD  [facility] Direct Dial: [phone]  Fax: [phone] [email or web address]  Skype  LinkedIn   This document serves as a record of services personally performed by Tyler Pita, MD and Freeman Caldron, PA-C. It was created on their behalf by Wilburn Mylar, a trained medical scribe. The creation of this record is based on the scribe's personal observations and the provider's statements to them. This document has been checked and approved by the attending provider.

## 2022-05-22 ENCOUNTER — Ambulatory Visit
Admission: RE | Admit: 2022-05-22 | Discharge: 2022-05-22 | Disposition: A | Discharge status: Home / Self Care | Payer: Medicare PPO | Source: Ambulatory Visit | Attending: Radiation Oncology | Admitting: Radiation Oncology

## 2022-05-22 ENCOUNTER — Other Ambulatory Visit: Payer: Self-pay

## 2022-05-22 ENCOUNTER — Telehealth: Payer: Self-pay | Admitting: *Deleted

## 2022-05-22 VITALS — BP 163/69 | HR 56 | Temp 97.8°F | Resp 20 | Ht 71.0 in | Wt 206.0 lb

## 2022-05-22 DIAGNOSIS — Z8673 Personal history of transient ischemic attack (TIA), and cerebral infarction without residual deficits: Secondary | ICD-10-CM | POA: Diagnosis not present

## 2022-05-22 DIAGNOSIS — K5730 Diverticulosis of large intestine without perforation or abscess without bleeding: Secondary | ICD-10-CM | POA: Diagnosis not present

## 2022-05-22 DIAGNOSIS — C61 Malignant neoplasm of prostate: Secondary | ICD-10-CM | POA: Diagnosis present

## 2022-05-22 DIAGNOSIS — I480 Paroxysmal atrial fibrillation: Secondary | ICD-10-CM | POA: Diagnosis not present

## 2022-05-22 DIAGNOSIS — Z803 Family history of malignant neoplasm of breast: Secondary | ICD-10-CM | POA: Diagnosis not present

## 2022-05-22 DIAGNOSIS — Z7901 Long term (current) use of anticoagulants: Secondary | ICD-10-CM | POA: Diagnosis not present

## 2022-05-22 NOTE — Telephone Encounter (Signed)
CALLED PATIENT TO INFORM OF FU WITH DR. Lovena Neighbours ON 06-17-22 - ARRIVAL TIME- 10 AM TO DISCUSS ADT, SPOKE WITH PATIENT AND HE IS AWARE OF THIS APPT.

## 2022-05-23 ENCOUNTER — Telehealth: Payer: Self-pay | Admitting: *Deleted

## 2022-05-23 NOTE — Telephone Encounter (Signed)
Returned patient's phone call, lvm for a return call 

## 2022-05-24 ENCOUNTER — Telehealth: Payer: Self-pay | Admitting: *Deleted

## 2022-05-24 ENCOUNTER — Telehealth: Payer: Self-pay | Admitting: Internal Medicine

## 2022-05-24 ENCOUNTER — Other Ambulatory Visit: Payer: Self-pay | Admitting: Urology

## 2022-05-24 NOTE — Telephone Encounter (Addendum)
   Pre-operative Risk Assessment    Patient Name: James Ramsey  DOB: 06-Feb-1941 MRN: [id number]      Request for Surgical Clearance    Procedure:   fiducial marker placement into prostate with space OAR  Date of Surgery:  Clearance 07/30/22                                 Surgeon:  Dr. Jacalyn Lefevre  Surgeon's Group or Practice Name:  [facility] Phone number:  [phone] Fax number:  [phone]   Type of Clearance Requested:   - Medical  - Pharmacy:  Hold Apixaban (Eliquis) needs to be held 3 days prior   Type of Anesthesia:   Monitored anesthesia care    Additional requests/questions:      [name]   05/24/2022, 11:07 AM

## 2022-05-24 NOTE — Progress Notes (Signed)
Called and introduced myself to the patient as the prostate nurse navigator.  No barriers to care identified at this time.  He had a RadOnc Consult on 9/20 to discuss his radiation treatment options. Patient received his first ADT injection with Dr. Lovena Neighbours on 9/21 and has been made aware of upcoming appointments for his fiducial marker's, spaceOAR, and CT Simulation. I gave him my direct contact and asked him to call me with questions or concerns.  Verbalized understanding.

## 2022-05-24 NOTE — Telephone Encounter (Signed)
CALLED PATIENT TO INFORM OF FID. MARKERS AND SPACE OAR PLACEMENT ON 07/30/22 AND HIS SIM ON 08-02-22- ARRIVAL TIME - 2:45 PM @ [place], INFORMED PATIENT TO ARRIVE WITH A FULL BLADDER, SPOKE WITH PATIENT AND HE IS AWARE OF THESE APPTS. AND THE INSTRUCTIONS.

## 2022-05-27 ENCOUNTER — Telehealth: Payer: Self-pay

## 2022-05-27 NOTE — Telephone Encounter (Signed)
   Name: James Ramsey  DOB: Oct 05, 1940  MRN: [id number]  Primary Cardiologist: None   Preoperative team, please contact this patient and set up a phone call appointment  on or after 07/10/2022 for further preoperative risk assessment. Please obtain consent and complete medication review. Thank you for your help.  I confirm that guidance regarding antiplatelet and oral anticoagulation therapy has been completed and, if necessary, noted below.  Patient with diagnosis of A Fib on Eliquis for anticoagulation.     Procedure: fiducial marker placement into prostate with space OAR Date of procedure: 07/30/22   CHA2DS2-VASc Score = 6  This indicates a 9.7% annual risk of stroke. The patient's score is based upon: CHF History: 0 HTN History: 1 Diabetes History: 0 Stroke History: 2 Vascular Disease History: 1 Age Score: 2 Gender Score: 0     CrCl 66 mL/min Platelet count 219K     Patient previously cleared last month to hold for 3 days for last prostate procedure. Can hold 3 days again.   Lenna Sciara, NP 05/27/2022, 4:07 PM [place]

## 2022-05-27 NOTE — Telephone Encounter (Signed)
Spoke with patient who is agreeable to do a tele visit on 11/9 @ 9 am. Consent have been given but med rec needs to be completed.

## 2022-05-27 NOTE — Telephone Encounter (Signed)
  Patient Consent for Virtual Visit        James Ramsey has provided verbal consent on 05/27/2022 for a virtual visit (video or telephone).   CONSENT FOR VIRTUAL VISIT FOR:  James Ramsey  By participating in this virtual visit I agree to the following:  I hereby voluntarily request, consent and authorize [facility] and its employed or contracted physicians, physician assistants, nurse practitioners or other licensed health care professionals (the Practitioner), to provide me with telemedicine health care services (the "Services") as deemed necessary by the treating Practitioner. I acknowledge and consent to receive the Services by the Practitioner via telemedicine. I understand that the telemedicine visit will involve communicating with the Practitioner through live audiovisual communication technology and the disclosure of certain medical information by electronic transmission. I acknowledge that I have been given the opportunity to request an in-person assessment or other available alternative prior to the telemedicine visit and am voluntarily participating in the telemedicine visit.  I understand that I have the right to withhold or withdraw my consent to the use of telemedicine in the course of my care at any time, without affecting my right to future care or treatment, and that the Practitioner or I may terminate the telemedicine visit at any time. I understand that I have the right to inspect all information obtained and/or recorded in the course of the telemedicine visit and may receive copies of available information for a reasonable fee.  I understand that some of the potential risks of receiving the Services via telemedicine include:  Delay or interruption in medical evaluation due to technological equipment failure or disruption; Information transmitted may not be sufficient (e.g. poor resolution of images) to allow for appropriate medical decision making by  the Practitioner; and/or  In rare instances, security protocols could fail, causing a breach of personal health information.  Furthermore, I acknowledge that it is my responsibility to provide information about my medical history, conditions and care that is complete and accurate to the best of my ability. I acknowledge that Practitioner's advice, recommendations, and/or decision may be based on factors not within their control, such as incomplete or inaccurate data provided by me or distortions of diagnostic images or specimens that may result from electronic transmissions. I understand that the practice of medicine is not an exact science and that Practitioner makes no warranties or guarantees regarding treatment outcomes. I acknowledge that a copy of this consent can be made available to me via my patient portal ([facility]), or I can request a printed copy by calling the office of [facility].    I understand that my insurance will be billed for this visit.   I have read or had this consent read to me. I understand the contents of this consent, which adequately explains the benefits and risks of the Services being provided via telemedicine.  I have been provided ample opportunity to ask questions regarding this consent and the Services and have had my questions answered to my satisfaction. I give my informed consent for the services to be provided through the use of telemedicine in my medical care

## 2022-05-27 NOTE — Telephone Encounter (Signed)
Patient with diagnosis of A Fib on Eliquis for anticoagulation.    Procedure: fiducial marker placement into prostate with space OAR Date of procedure: 07/30/22  CHA2DS2-VASc Score = 6  This indicates a 9.7% annual risk of stroke. The patient's score is based upon: CHF History: 0 HTN History: 1 Diabetes History: 0 Stroke History: 2 Vascular Disease History: 1 Age Score: 2 Gender Score: 0    CrCl 66 mL/min Platelet count 219K   Patient previously cleared last month to hold for 3 days for last prostate procedure. Can hold 3 days again.   **This guidance is not considered finalized until pre-operative APP has relayed final recommendations.**

## 2022-06-29 IMAGING — CT CT HEAD W/O CM
4 series · 16 of 47 positions shown, 18 images · non-contrast
Comparison: None

CLINICAL DATA: Acute neurologic deficit, possible stroke. Visual
changes in the right eye.

EXAM:
CT HEAD WITHOUT CONTRAST
TECHNIQUE: Contiguous axial images were obtained from the base of the skull
through the vertex without intravenous contrast.

[Series 3: head bone · axial · 0.51mm/px · z∈[-125,-93]mm · 3 of 84 slices shown]
[im 9/84  bone]
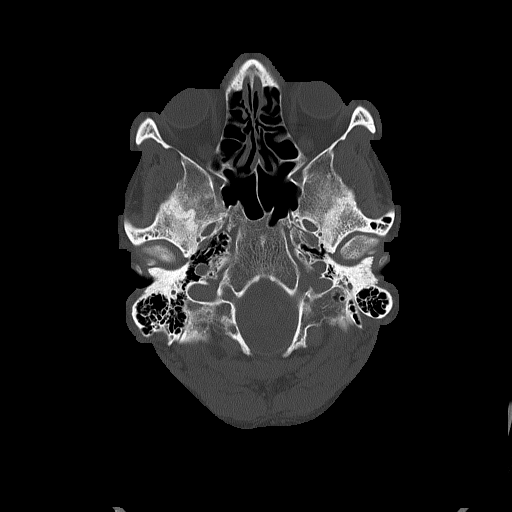
[im 17/84  bone]
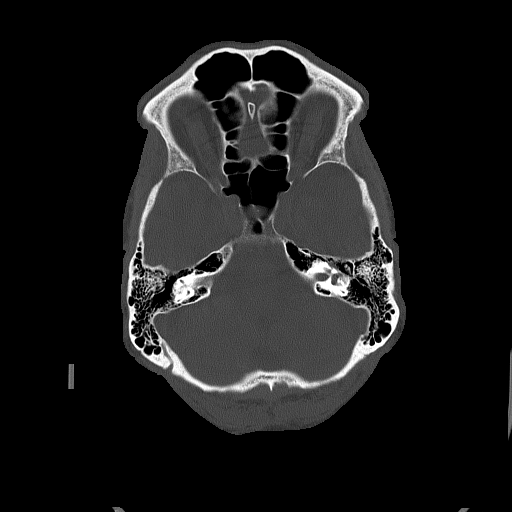
[im 25/84  bone]
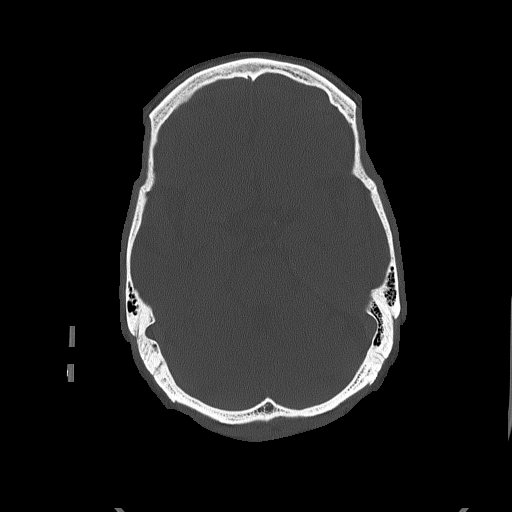

[Series 4: head without · axial · non-contrast · 0.51mm/px · z∈[-121,-1]mm · 7 of 34 slices shown, 9 images]
[im 5/34  brain]
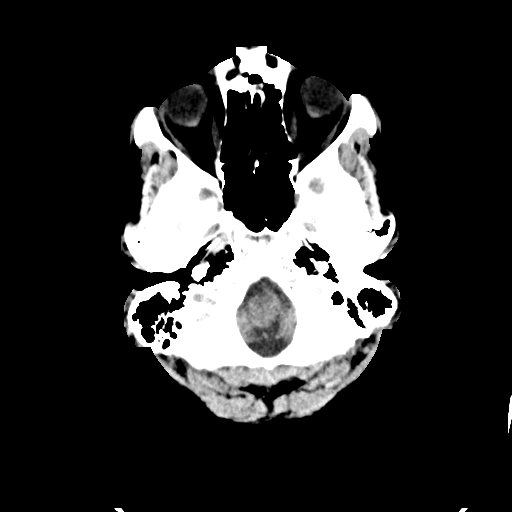
[im 5/34  bone]
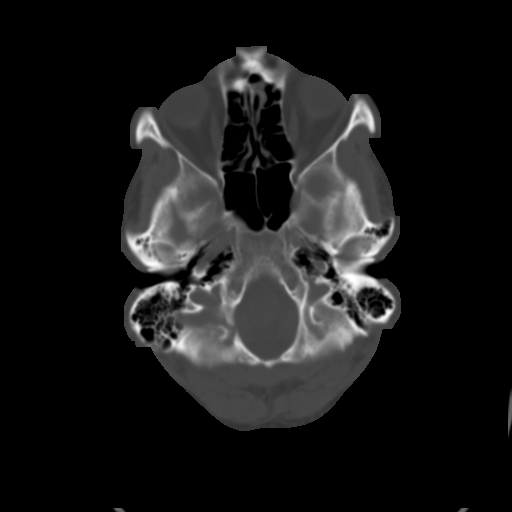
[im 9/34  brain]
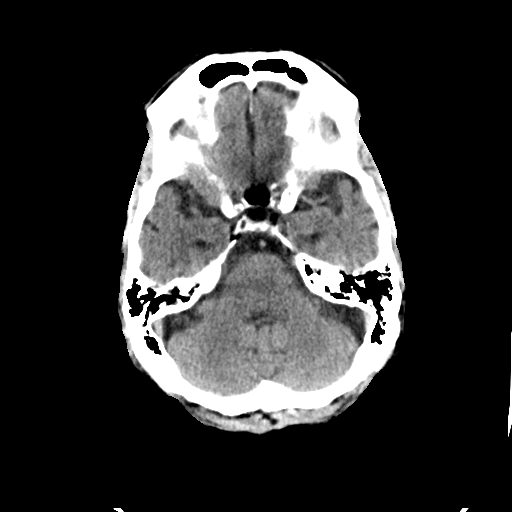
[im 13/34  brain]
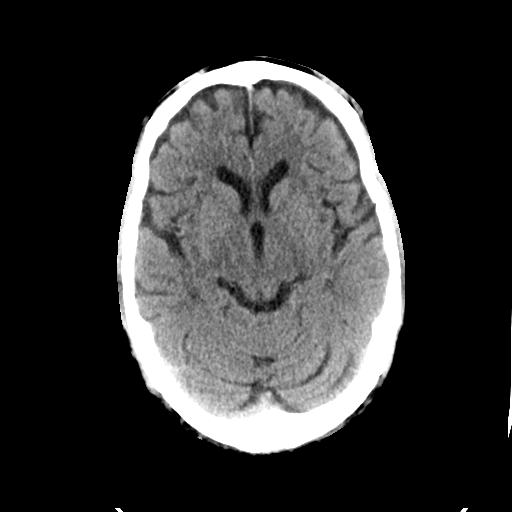
[im 17/34  brain]
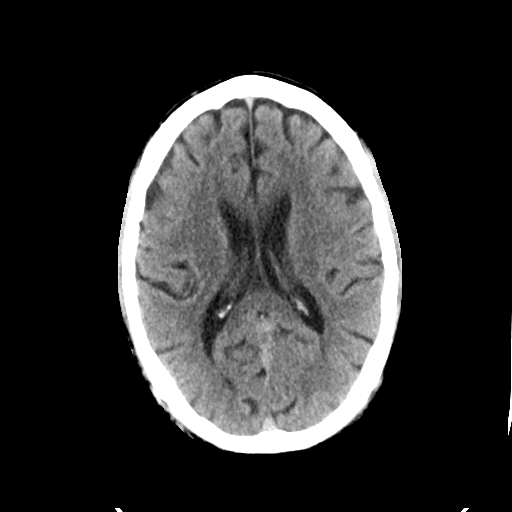
[im 21/34  brain]
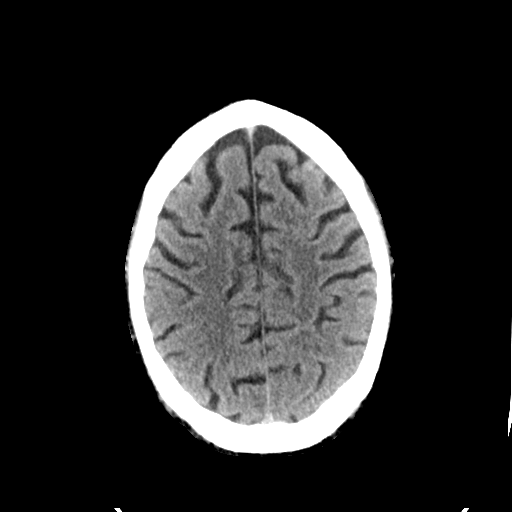
[im 21/34  bone]
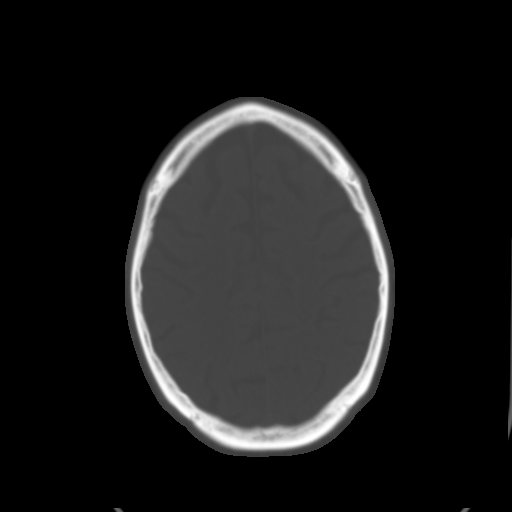
[im 25/34  brain]
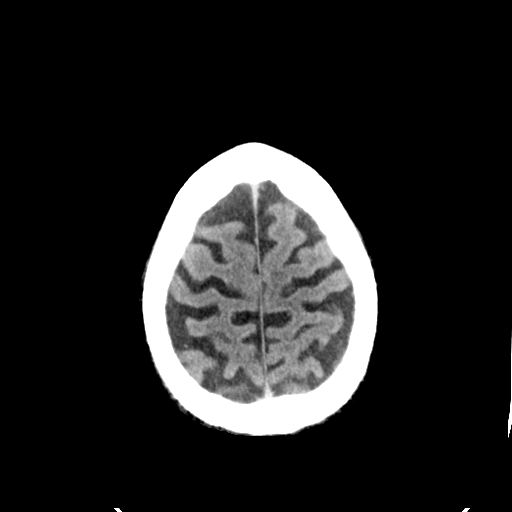
[im 29/34  brain]
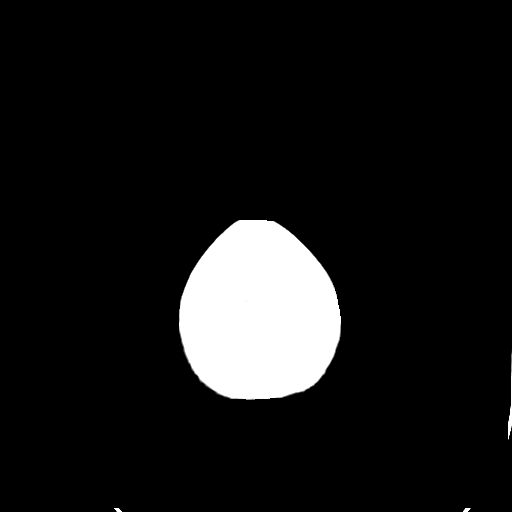

[Series 5: head without cor · coronal · non-contrast · 0.33mm/px · 3 of 79 slices shown]
[im 27/79  brain]
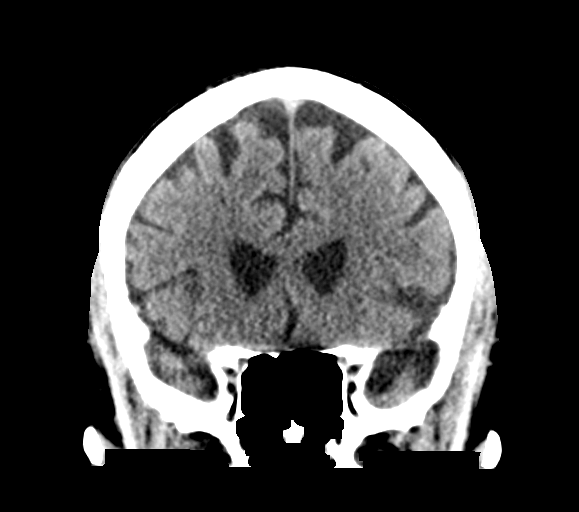
[im 35/79  brain]
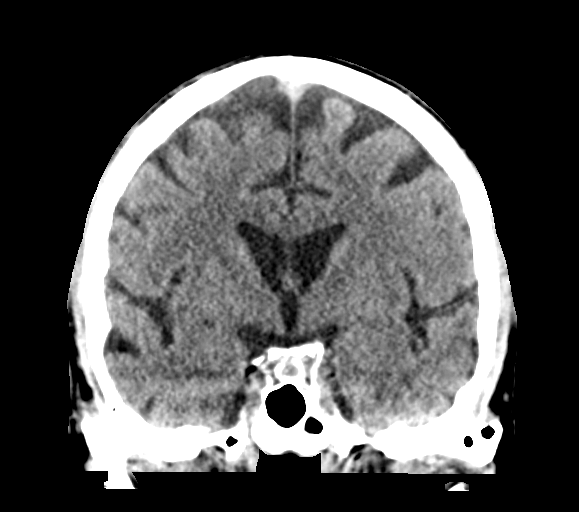
[im 44/79  brain]
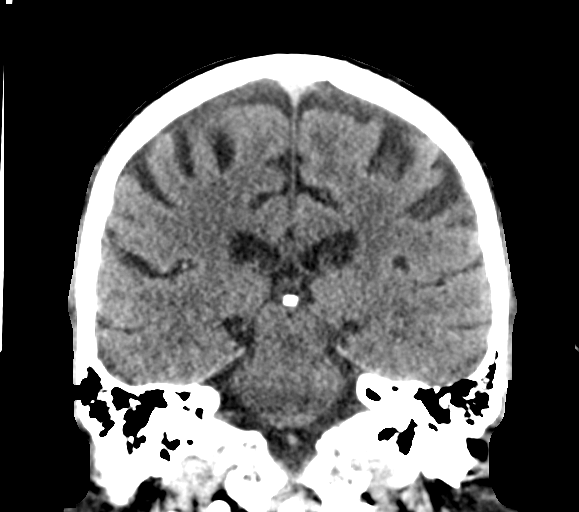

[Series 6: head without sag · sagittal · non-contrast · 0.33mm/px · 3 of 67 slices shown]
[im 23/67  brain]
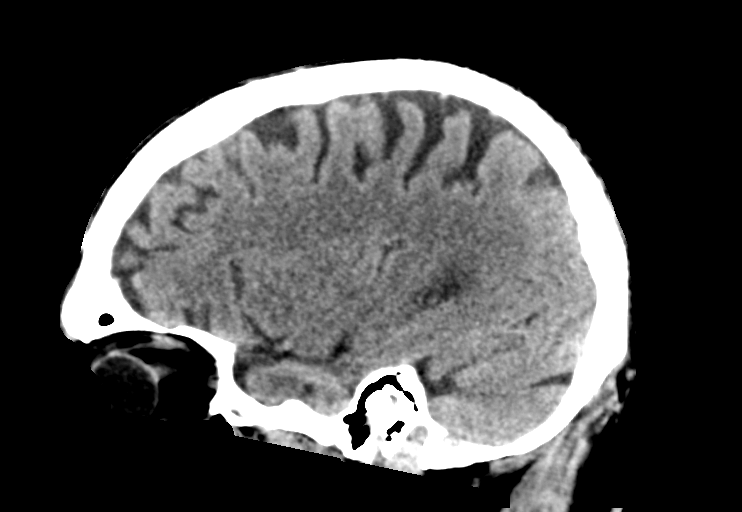
[im 34/67  brain]
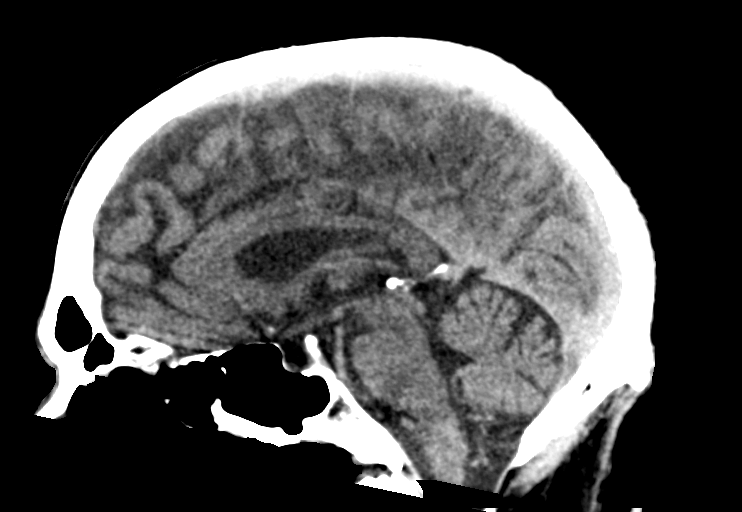
[im 45/67  brain]
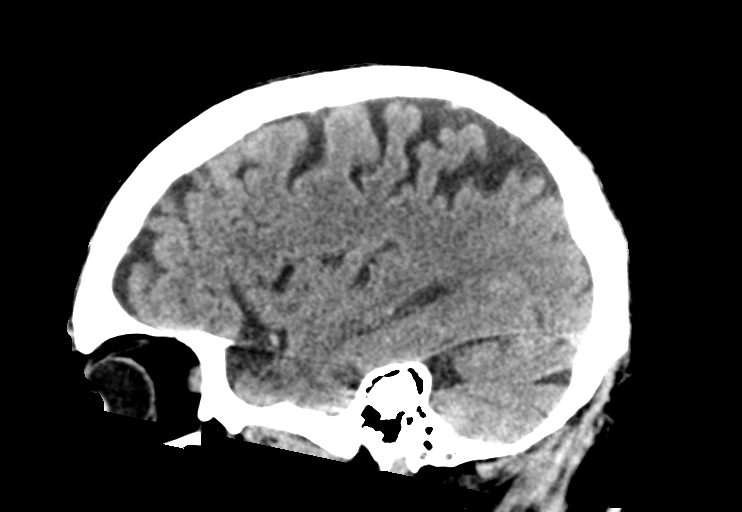

[16 of 47 positions shown; findings below may reference images not displayed]

FINDINGS: Brain: Periventricular white matter and corona radiata hypodensities
favor chronic ischemic microvascular white matter disease, mildly
confluent along the anterior limb of the left internal capsule for
example on image 12 series 4, probably from confluent chronic
ischemic microvascular white matter disease. No specific findings of
acute CVA, mass lesion, or intracranial hemorrhage. The brainstem,
cerebellum, cerebral peduncles, thalami, and basal ganglia appear
unremarkable as does the ventricular system.

Vascular: Unremarkable

Skull: Unremarkable

Sinuses/Orbits: Unremarkable.

Other: No supplemental non-categorized findings.
IMPRESSION: 1. No acute intracranial findings.
2. Periventricular white matter and corona radiata hypodensities
favor chronic ischemic microvascular white matter disease, mildly
confluent along the anterior limb of the left internal capsule.

## 2022-07-08 NOTE — Progress Notes (Unsigned)
Virtual Visit via Telephone Note   Because of James Ramsey's co-morbid illnesses, he is at least at moderate risk for complications without adequate follow up.  This format is felt to be most appropriate for this patient at this time.  The patient did not have access to video technology/had technical difficulties with video requiring transitioning to audio format only (telephone).  All issues noted in this document were discussed and addressed.  No physical exam could be performed with this format.  Please refer to the patient's chart for his consent to telehealth for [facility].  Evaluation Performed:  Preoperative cardiovascular risk assessment _____________   Date:  07/08/2022   Patient ID:  James Ramsey, DOB 09-20-1940, MRN [id number] Patient Location:  Home Provider location:   Office  Primary Care Provider:  Aretta Nip, MD Primary Cardiologist:  None  Chief Complaint / Patient Profile   81 y.o. y/o male with a h/o PAF on chronic anticoagulation, stroke, bradycardia who is pending fiducial marker placement into prostate with space OAR and presents today for telephonic preoperative cardiovascular risk assessment.  Past Medical History    Past Medical History:  Diagnosis Date   Dysrhythmia    Paroxysmal atrial fibrillation ([place]) 10/29/2020   Stroke (cerebrum) [facility])    Past Surgical History:  Procedure Laterality Date   CARPAL TUNNEL RELEASE Right 11/26/2021   Procedure: RIGHT CARPAL TUNNEL RELEASE;  Surgeon: Sherilyn Cooter, MD;  Location: [facility];  Service: Orthopedics;  Laterality: Right;   NO PAST SURGERIES     PROSTATE BIOPSY N/A 04/24/2022   Procedure: BIOPSY TRANSRECTAL ULTRASONIC PROSTATE (TUBP);  Surgeon: Ceasar Mons, MD;  Location: [facility];  Service: Urology;  Laterality: N/A;  ONLY NEEDS 60 MIN FOR BOTH   TRANSURETHRAL RESECTION OF BLADDER TUMOR N/A 04/24/2022   Procedure:  POSSIBLE TRANSURETHRAL RESECTION OF BLADDER TUMOR (TURBT);  Surgeon: Ceasar Mons, MD;  Location: [facility];  Service: Urology;  Laterality: N/A;    Allergies  No Known Allergies  History of Present Illness    James Ramsey is a 81 y.o. male who presents via audio/video conferencing for a telehealth visit today.  Pt was last seen in cardiology clinic on 05/10/22 by Tommye Standard, PA.  At that time Estil Vallee was doing well.  The patient is now pending procedure as outlined above. Since his last visit, he denies chest pain, shortness of breath, lower extremity edema, fatigue, palpitations, melena, hematuria, hemoptysis, diaphoresis, weakness, presyncope, syncope, orthopnea, and PND. He walks 2-3 miles 5 days per week in addition to 2-3 days of weight lifting   Home Medications    Prior to Admission medications   Medication Sig Start Date End Date Taking? Authorizing Provider  Calcium Carb-Cholecalciferol 600-20 MG-MCG TABS Take 1 tablet by mouth 2 (two) times a week.    [provider]  Coenzyme Q10 200 MG capsule Take 200 mg by mouth daily.    [provider]  Cyanocobalamin (B-12) 250 MCG TABS Take 250 mcg by mouth daily.    [provider]  D-RIBOSE PO Take 500 mg by mouth daily.    [provider]  ELIQUIS 5 MG TABS tablet TAKE 1 TABLET BY MOUTH TWICE A DAY 02/15/22   Allred, Jeneen Rinks, MD  magnesium gluconate (MAGONATE) 500 MG tablet Take 500 mg by mouth daily.    [provider]  Nattokinase 100 MG CAPS Take 100 mg by mouth daily.  [provider]  Omega 3 1200 MG CAPS Take 1 capsule by mouth daily.    [provider]  OVER THE COUNTER MEDICATION Macular Supplement 1 tablet daily    [provider]  pyridOXINE (VITAMIN B-6) 100 MG tablet Take 100 mg by mouth daily.    [provider]  thiamine 100 MG tablet Take 100 mg by mouth daily.    [provider]    Physical Exam    Vital Signs:  Abdias Hickam does not have vital signs available for review today.  Given telephonic nature of communication, physical exam is limited. AAOx3. NAD. Normal affect.  Speech and respirations are unlabored.  Accessory Clinical Findings    None  Assessment & Plan    1.  Preoperative Cardiovascular Risk Assessment: The patient is doing well from a cardiac perspective. Therefore, based on ACC/AHA guidelines, the patient would be at acceptable risk for the planned procedure without further cardiovascular testing. According to the Revised Cardiac Risk Index (RCRI), his Perioperative Risk of Major Cardiac Event is (%): 0.9. His Functional Capacity in METs is: 7.59 according to the Duke Activity Status Index (DASI).  Patient may hold Eliquis for 3 days prior to procedure and should resume as soon as hemodynamically stable after the procedure.    The patient was advised that if he develops new symptoms prior to surgery to contact our office to arrange for a follow-up visit, and he verbalized understanding.  A copy of this note will be routed to requesting surgeon.  Time:   Today, I have spent 7 minutes with the patient with telehealth technology discussing medical history, symptoms, and management plan.    Emmaline Life, NP-C  07/11/2022, 9:00 AM [address], [address] Office [phone] Fax [phone]

## 2022-07-11 ENCOUNTER — Ambulatory Visit: Payer: Medicare PPO | Attending: Internal Medicine | Admitting: Nurse Practitioner

## 2022-07-11 ENCOUNTER — Encounter: Payer: Self-pay | Admitting: Nurse Practitioner

## 2022-07-11 DIAGNOSIS — Z01810 Encounter for preprocedural cardiovascular examination: Secondary | ICD-10-CM | POA: Diagnosis not present

## 2022-07-17 ENCOUNTER — Encounter (HOSPITAL_BASED_OUTPATIENT_CLINIC_OR_DEPARTMENT_OTHER): Payer: Self-pay | Admitting: Urology

## 2022-07-17 NOTE — Progress Notes (Signed)
Spoke w/ via phone for pre-op interview--- James Ramsey Lab needs dos----  NONE             Lab results------Current EKG in Epic dated 04/2022 COVID test -----patient states asymptomatic no test needed Arrive at -------0700 NPO after MN NO Solid Foo Med rec completed Medications to take morning of surgery ----- Diabetic medication ----- Patient instructed no nail polish to be worn day of surgery Patient instructed to bring photo id and insurance card day of surgery Patient aware to have Driver (ride ) / caregiver    for 24 hours after surgery  Patient Special Instructions ----- Pre-Op special Istructions ----- Pt verbalized understanding to use FLEETS enema preop. Patient verbalized understanding of instructions that were given at this phone interview. Patient denies shortness of breath, chest pain, fever, cough at this phone interview.  *Cardiac clearance on chart and in Epic dated 07/11/22. Per clearance pt to hold Eliquis 3 days prior to procedure, pt aware and verbalized understanding.

## 2022-07-29 NOTE — H&P (Signed)
CC/HPI: Prostate cancer   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 He is currently on Eliquis due to a-fib along with a suspected CVA in 2022.   Last PSA: 7.73  Biopsy Date: 04/24/2022  TNM stage: T1c  Gleason score: 4+5 = 9  Left: Grades 3, 4 and 5 in six out of six cores  Right: NED  Prostate volume: 25.9 cm   05/02/2022: The patient is here today for routine postop visit. He was found to have high risk prostate cancer in multiple cores along with nonmuscle invasive bladder cancer. He reports 2 to 3 days of intermittent episodes of gross hematuria that is since resolved. He states that he is voiding without difficulty with a good force of stream and feels like he is emptying his bladder well. He is accompanied by his wife during today's visit.   05/23/22: The patient is here today for his first Eligard injection. Recent PET CT scan showed no evidence of metastatic prostate cancer. He has met with Dr. Tammi Klippel and has agreed to proceed with XRT for primary treatment of his prostate cancer.   07/12/22: The patient is here today for a routine surveillance cystoscopy. He is scheduled to have gold seed fiducial marker and SpaceOAR placement on 07/30/2022 with Dr. Claudia Desanctis and is scheduled for XRT with Dr. Tammi Klippel shortly thereafter. He has done well since his last visit and denies any bothersome urinary symptoms. Since starting Eligard, the patient reports significant fatigue and sporadic hot flashes. He denies any other associated symptoms with Eligard.     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   Tobacco Use Assessment Completed: Used Tobacco in last 30 days? Drinks 2 drinks per day. Types of alcohol consumed: Beer. Light Drinker.  Drinks 4+ caffeinated drinks per day. 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  Gastrointestinal (Upper):   Patient denies nausea, vomiting, and indigestion/ heartburn.  Gastrointestinal (Lower):   Patient denies diarrhea and constipation.  Constitutional:   Patient denies fever, night sweats, weight loss, and fatigue.  Skin:   Patient denies skin rash/ lesion and itching.  Eyes:   Patient denies blurred vision and double vision.  Ears/ Nose/ Throat:   Patient denies sore throat and sinus problems.  Hematologic/Lymphatic:   Patient denies swollen glands and easy bruising.  Cardiovascular:   Patient denies leg swelling and chest pains.  Respiratory:  Patient denies cough and shortness of breath.  Endocrine:   Patient denies excessive thirst.  Musculoskeletal:   Patient denies back pain and joint pain.  Neurological:   Patient denies headaches and dizziness.  Psychologic:   Patient denies depression and anxiety.   VITAL SIGNS:      07/12/2022 08:35 AM  Weight 195 lb / 88.45 kg  BP 167/83 mmHg  Pulse 47 /min   GU PHYSICAL EXAMINATION:    Urethral Meatus: Normal  size. No lesion, no wart, no discharge, no polyp. Normal location.  Penis: Circumcised, no warts, no cracks. No dorsal Peyronie's plaques, no left corporal Peyronie's plaques, no right corporal Peyronie's plaques, no scarring, no warts. No balanitis, no meatal stenosis.   MULTI-SYSTEM PHYSICAL EXAMINATION:       Complexity of Data:  Lab Test Review:   Total Testosterone  Records Review:   Previous Patient Records   12/17/21 12/06/20 11/24/19 12/15/17  PSA  Total PSA 7.73 ng/ml 5.61 ng/ml 5.03 ng/ml 3.43 ng/mL    PROCEDURES:         Flexible Cystoscopy - 52000  Risks, benefits, and some of the potential complications of the procedure were discussed at length with the patient including infection, bleeding, voiding discomfort, urinary retention, fever, chills, sepsis, and others. All questions were answered. Informed consent was obtained. Antibiotic prophylaxis was given. Sterile technique and intraurethral analgesia were used.  Meatus:  Normal size. Normal location. Normal condition.  Urethra:  No strictures.  External Sphincter:  Normal.  Verumontanum:  Normal.  Prostate:  Non-obstructing. No hyperplasia.  Bladder Neck:  Non-obstructing.  Ureteral Orifices:  Normal location. Normal size. Normal shape. Effluxed clear urine.  Bladder:  No trabeculation. No tumors. Normal mucosa. No stones.      The lower urinary tract was carefully examined. The procedure was well-tolerated and without complications. Antibiotic instructions were given. Instructions were given to call the office immediately for bloody urine, difficulty urinating, urinary retention, painful or frequent urination, fever, chills, nausea, vomiting or other illness. The patient stated that he understood these instructions and would comply with them.         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 Rankins, MD   Created for Patient: Clinical Summary         Notes:    -Cystoscopy today was negative for any signs of urothelial carcinoma recurrence within the urethra or bladder.  -Plan for Eligard injection in February  -Keep scheduled surgery date with Dr. Claudia Desanctis on 07/22/2022 for gold seeds and SpaceOAR

## 2022-07-29 NOTE — Anesthesia Preprocedure Evaluation (Signed)
Anesthesia Evaluation  Patient identified by MRN, date of birth, ID band Patient awake    Reviewed: Allergy & Precautions, NPO status , Patient's Chart, lab work & pertinent test results  Airway Mallampati: II  TM Distance: >3 FB Neck ROM: Full    Dental no notable dental hx. (+) Teeth Intact, Dental Advisory Given   Pulmonary neg pulmonary ROS   Pulmonary exam normal breath sounds clear to auscultation       Cardiovascular negative cardio ROS Normal cardiovascular exam+ dysrhythmias Atrial Fibrillation  Rhythm:Regular Rate:Normal  09/2020 TTE  1. Left ventricular ejection fraction, by estimation, is 65 to 70%. The  left ventricle has normal function. The left ventricle has no regional  wall motion abnormalities. There is mild left ventricular hypertrophy.  Left ventricular diastolic parameters  are consistent with Grade I diastolic dysfunction (impaired relaxation).   2. Right ventricular systolic function is normal. The right ventricular  size is normal. Tricuspid regurgitation signal is inadequate for assessing  PA pressure.   3. The mitral valve is normal in structure. Trivial mitral valve  regurgitation. No evidence of mitral stenosis.   4. The aortic valve is tricuspid. Aortic valve regurgitation is not  visualized. Mild aortic valve sclerosis is present, with no evidence of  aortic valve stenosis.   5. The inferior vena cava is normal in size with greater than 50%  respiratory variability, suggesting right atrial pressure of 3 mmHg.     Neuro/Psych CVA (09/2020), No Residual Symptoms  negative psych ROS   GI/Hepatic negative GI ROS, Neg liver ROS,,,  Endo/Other    Renal/GU    Prostate CA, Bladder CA S/P TURBT    Musculoskeletal   Abdominal   Peds  Hematology On Eliquis   Anesthesia Other Findings   Reproductive/Obstetrics                             Anesthesia  Physical Anesthesia Plan  ASA: 3  Anesthesia Plan: MAC   Post-op Pain Management: Ofirmev IV (intra-op)*   Induction: Intravenous  PONV Risk Score and Plan: Treatment may vary due to age or medical condition  Airway Management Planned: Nasal Cannula and Natural Airway  Additional Equipment: None  Intra-op Plan:   Post-operative Plan:   Informed Consent: I have reviewed the patients History and Physical, chart, labs and discussed the procedure including the risks, benefits and alternatives for the proposed anesthesia with the patient or authorized representative who has indicated his/her understanding and acceptance.     Dental advisory given  Plan Discussed with:   Anesthesia Plan Comments:        Anesthesia Quick Evaluation

## 2022-07-30 ENCOUNTER — Ambulatory Visit (HOSPITAL_BASED_OUTPATIENT_CLINIC_OR_DEPARTMENT_OTHER): Payer: Medicare PPO | Admitting: Anesthesiology

## 2022-07-30 ENCOUNTER — Encounter (HOSPITAL_BASED_OUTPATIENT_CLINIC_OR_DEPARTMENT_OTHER)
Admission: RE | Disposition: A | Discharge status: Home / Self Care | Payer: Self-pay | Source: Home / Self Care | Attending: Urology

## 2022-07-30 ENCOUNTER — Encounter (HOSPITAL_BASED_OUTPATIENT_CLINIC_OR_DEPARTMENT_OTHER): Payer: Self-pay | Admitting: Urology

## 2022-07-30 ENCOUNTER — Ambulatory Visit (HOSPITAL_BASED_OUTPATIENT_CLINIC_OR_DEPARTMENT_OTHER)
Admission: RE | Admit: 2022-07-30 | Discharge: 2022-07-30 | Disposition: A | Discharge status: Home / Self Care | Payer: Medicare PPO | Attending: Urology | Admitting: Urology

## 2022-07-30 ENCOUNTER — Other Ambulatory Visit: Payer: Self-pay

## 2022-07-30 DIAGNOSIS — Z8673 Personal history of transient ischemic attack (TIA), and cerebral infarction without residual deficits: Secondary | ICD-10-CM

## 2022-07-30 DIAGNOSIS — C61 Malignant neoplasm of prostate: Secondary | ICD-10-CM

## 2022-07-30 DIAGNOSIS — C679 Malignant neoplasm of bladder, unspecified: Secondary | ICD-10-CM | POA: Insufficient documentation

## 2022-07-30 DIAGNOSIS — Z79899 Other long term (current) drug therapy: Secondary | ICD-10-CM | POA: Diagnosis not present

## 2022-07-30 DIAGNOSIS — I4891 Unspecified atrial fibrillation: Secondary | ICD-10-CM | POA: Diagnosis not present

## 2022-07-30 DIAGNOSIS — Z7901 Long term (current) use of anticoagulants: Secondary | ICD-10-CM | POA: Diagnosis not present

## 2022-07-30 HISTORY — DX: Malignant (primary) neoplasm, unspecified: C80.1

## 2022-07-30 HISTORY — PX: SPACE OAR INSTILLATION: SHX6769

## 2022-07-30 HISTORY — PX: GOLD SEED IMPLANT: SHX6343

## 2022-07-30 SURGERY — INSERTION, GOLD SEEDS
Anesthesia: Monitor Anesthesia Care

## 2022-07-30 MED ORDER — FENTANYL CITRATE (PF) 100 MCG/2ML IJ SOLN
INTRAMUSCULAR | Status: DC | PRN
  Administered 2022-07-30: 50 ug via INTRAVENOUS

## 2022-07-30 MED ORDER — PROPOFOL 10 MG/ML IV BOLUS
INTRAVENOUS | Status: DC | PRN
  Administered 2022-07-30: 10 mg via INTRAVENOUS
  Administered 2022-07-30: 20 mg via INTRAVENOUS

## 2022-07-30 MED ORDER — FENTANYL CITRATE (PF) 100 MCG/2ML IJ SOLN
INTRAMUSCULAR | Status: AC
  Filled 2022-07-30: qty 2

## 2022-07-30 MED ORDER — PROPOFOL 500 MG/50ML IV EMUL
INTRAVENOUS | Status: AC
  Filled 2022-07-30: qty 50

## 2022-07-30 MED ORDER — CEFAZOLIN SODIUM-DEXTROSE 2-4 GM/100ML-% IV SOLN
INTRAVENOUS | Status: AC
  Filled 2022-07-30: qty 100

## 2022-07-30 MED ORDER — ONDANSETRON HCL 4 MG/2ML IJ SOLN
INTRAMUSCULAR | Status: DC | PRN
  Administered 2022-07-30: 4 mg via INTRAVENOUS

## 2022-07-30 MED ORDER — LACTATED RINGERS IV SOLN: INTRAVENOUS | Status: DC

## 2022-07-30 MED ORDER — ONDANSETRON HCL 4 MG/2ML IJ SOLN: 4.0000 mg | Freq: Once | INTRAMUSCULAR | Status: DC | PRN

## 2022-07-30 MED ORDER — ACETAMINOPHEN 10 MG/ML IV SOLN: 1000.0000 mg | Freq: Once | INTRAVENOUS | Status: DC | PRN

## 2022-07-30 MED ORDER — PROPOFOL 500 MG/50ML IV EMUL
INTRAVENOUS | Status: DC | PRN
  Administered 2022-07-30: 200 ug/kg/min via INTRAVENOUS

## 2022-07-30 MED ORDER — BUPIVACAINE HCL 0.25 % IJ SOLN
INTRAMUSCULAR | Status: DC | PRN
  Administered 2022-07-30: 10 mL

## 2022-07-30 MED ORDER — CEFAZOLIN SODIUM-DEXTROSE 2-4 GM/100ML-% IV SOLN
2.0000 g | Freq: Once | INTRAVENOUS | Status: AC
  Administered 2022-07-30: 2 g via INTRAVENOUS

## 2022-07-30 MED ORDER — FLEET ENEMA 7-19 GM/118ML RE ENEM: 1.0000 | ENEMA | Freq: Once | RECTAL | Status: DC

## 2022-07-30 MED ORDER — SODIUM CHLORIDE 0.9% FLUSH
INTRAVENOUS | Status: DC | PRN
  Administered 2022-07-30: 10 mL

## 2022-07-30 MED ORDER — FENTANYL CITRATE (PF) 100 MCG/2ML IJ SOLN: 25.0000 ug | INTRAMUSCULAR | Status: DC | PRN

## 2022-07-30 SURGICAL SUPPLY — 25 items
BLADE CLIPPER SENSICLIP SURGIC (BLADE) ×1 IMPLANT
CNTNR URN SCR LID CUP LEK RST (MISCELLANEOUS) ×1 IMPLANT
CONT SPEC 4OZ STRL OR WHT (MISCELLANEOUS) ×1
COVER BACK TABLE 60X90IN (DRAPES) ×1 IMPLANT
DRSG TEGADERM 4X4.75 (GAUZE/BANDAGES/DRESSINGS) ×1 IMPLANT
DRSG TEGADERM 8X12 (GAUZE/BANDAGES/DRESSINGS) ×1 IMPLANT
GAUZE SPONGE 4X4 12PLY STRL (GAUZE/BANDAGES/DRESSINGS) ×1 IMPLANT
GLOVE BIO SURGEON STRL SZ 6.5 (GLOVE) ×1 IMPLANT
GLOVE BIO SURGEON STRL SZ7.5 (GLOVE) ×1 IMPLANT
GLOVE ECLIPSE 8.0 STRL XLNG CF (GLOVE) ×1 IMPLANT
IMPL SPACEOAR VUE SYSTEM (Spacer) ×1 IMPLANT
IMPLANT SPACEOAR VUE SYSTEM (Spacer) ×1 IMPLANT
KIT TURNOVER CYSTO (KITS) ×1 IMPLANT
MARKER GOLD PRELOAD 1.2X3 (Urological Implant) ×1 IMPLANT
MARKER SKIN DUAL TIP RULER LAB (MISCELLANEOUS) ×1 IMPLANT
NDL SPNL 22GX3.5 QUINCKE BK (NEEDLE) ×1 IMPLANT
NEEDLE SPNL 22GX3.5 QUINCKE BK (NEEDLE) ×1 IMPLANT
SEED GOLD PRELOAD 1.2X3 (Urological Implant) ×1 IMPLANT
SHEATH ULTRASOUND LF (SHEATH) IMPLANT
SHEATH ULTRASOUND LTX NONSTRL (SHEATH) IMPLANT
SURGILUBE 2OZ TUBE FLIPTOP (MISCELLANEOUS) ×1 IMPLANT
SYR 10ML LL (SYRINGE) IMPLANT
SYR CONTROL 10ML LL (SYRINGE) ×1 IMPLANT
TOWEL OR 17X26 10 PK STRL BLUE (TOWEL DISPOSABLE) ×1 IMPLANT
UNDERPAD 30X36 HEAVY ABSORB (UNDERPADS AND DIAPERS) ×1 IMPLANT

## 2022-07-30 NOTE — Op Note (Signed)
Preoperative diagnosis: Clinically localized adenocarcinoma of the prostate  Postoperative diagnosis: Clinically localized adenocarcinoma of the prostate  Procedure: 1) Placement of fiducial markers into prostate                    2) Insertion of SpaceOAR hydrogel   Surgeon: Jacalyn Lefevre, MD   Anesthesia: General  EBL: Minimal  Complications: None  Indication: James Ramsey is a 81 y.o. gentleman with clinically localized prostate cancer. After discussing management options for treatment, he elected to proceed with radiotherapy. He presents today for the above procedures. The potential risks, complications, alternative options, and expected recovery course have been discussed in detail with the patient and he has provided informed consent to proceed.  Description of procedure: The patient was administered preoperative antibiotics, placed in the dorsal lithotomy position, and prepped and draped in the usual sterile fashion. Next, transrectal ultrasonography was utilized to visualize the prostate. Three gold fiducial markers were then placed into the prostate via transperineal needles under ultrasound guidance at the right apex, right base, and left mid gland under direct ultrasound guidance. A site in the midline was then selected on the perineum for placement of an 18 g needle with saline. The needle was advanced above the rectum and below Denonvillier's fascia to the mid gland and confirmed to be in the midline on transverse imaging. One cc of saline was injected confirming appropriate expansion of this space. A total of 5 cc of saline was then injected to open the space further bilaterally. The saline syringe was then removed and the SpaceOAR hydrogel was injected with good distribution bilaterally. He tolerated the procedure well and without complications. He was given a voiding trial prior to discharge from the PACU.

## 2022-07-30 NOTE — Progress Notes (Signed)
Patient texted wife to say he was ready to go over discharge instructions.  Wife was not called by staff.

## 2022-07-30 NOTE — Interval H&P Note (Signed)
History and Physical Interval Note:  07/30/2022 8:24 AM  James Ramsey  has presented today for surgery, with the diagnosis of PROSTATE CANCER.  The various methods of treatment have been discussed with the patient and family. After consideration of risks, benefits and other options for treatment, the patient has consented to  Procedure(s): GOLD SEED IMPLANT (N/A) SPACE OAR INSTILLATION (N/A) as a surgical intervention.  The patient's history has been reviewed, patient examined, no change in status, stable for surgery.  I have reviewed the patient's chart and labs.  Questions were answered to the patient's satisfaction.     Simrat Kendrick D Allysia Ingles

## 2022-07-30 NOTE — Anesthesia Postprocedure Evaluation (Signed)
Anesthesia Post Note  Patient: James Ramsey  Procedure(s) Performed: GOLD SEED IMPLANT SPACE OAR INSTILLATION     Patient location during evaluation: PACU Anesthesia Type: MAC Level of consciousness: awake and alert Pain management: pain level controlled Vital Signs Assessment: post-procedure vital signs reviewed and stable Respiratory status: spontaneous breathing, nonlabored ventilation, respiratory function stable and patient connected to nasal cannula oxygen Cardiovascular status: stable and blood pressure returned to baseline Postop Assessment: no apparent nausea or vomiting Anesthetic complications: no  No notable events documented.  Last Vitals:  Vitals:   07/30/22 1100 07/30/22 1110  BP: (!) 133/120 137/68  Pulse: (!) 48 (!) 43  Resp: 15 10  Temp:    SpO2: 99% 99%    Last Pain:  Vitals:   07/30/22 1110  TempSrc:   PainSc: 0-No pain                 Barnet Glasgow

## 2022-07-30 NOTE — Transfer of Care (Signed)
Immediate Anesthesia Transfer of Care Note  Patient: James Ramsey  Procedure(s) Performed: GOLD SEED IMPLANT SPACE OAR INSTILLATION  Patient Location: PACU  Anesthesia Type:MAC  Level of Consciousness: awake, alert , oriented, and patient cooperative  Airway & Oxygen Therapy: Patient Spontanous Breathing and Patient connected to face mask oxygen  Post-op Assessment: Report given to RN and Post -op Vital signs reviewed and stable  Post vital signs: Reviewed and stable  Last Vitals:  Vitals Value Taken Time  BP 102/59 07/30/22 1041  Temp    Pulse 46 07/30/22 1043  Resp 9 07/30/22 1043  SpO2 97 % 07/30/22 1043  Vitals shown include unvalidated device data.  Last Pain:  Vitals:   07/30/22 0727  TempSrc: Oral  PainSc: 0-No pain      Patients Stated Pain Goal: 5 (29/79/89 2119)  Complications: No notable events documented.

## 2022-07-30 NOTE — Discharge Instructions (Addendum)
DISCHARGE INSTRUCTIONS FOR PROSTATE FIDUCIAL MARKERS AND SPACE OAR  Antibiotics You may be given a prescription for an antibiotic to take when you arrive home. If so, be sure to take every tablet in the bottle, even if you are feeling better before the prescription is finished. If you begin itching, notice a rash or start to swell on your trunk, arms, legs and/or throat, immediately stop taking the antibiotic and call your Urologist.  Diet Resume your usual diet when you return home. To keep your bowels moving easily and softly, drink prune, apple and cranberry juice at room temperature. You may also take a stool softener, such as Colace, which is available without prescription at local pharmacies.  Daily activities No driving or heavy lifting for at least two days after the implant. No bike riding, horseback riding or riding lawn mowers for the first month after the implant. Any strenuous physical activity should be approved by your doctor before you resume it.  Sexual relations You may resume sexual relations two weeks after the procedure.  Your semen may be dark brown or black; this is normal and is related bleeding that may have occurred during the implant.  Postoperative swelling Expect swelling and bruising of the scrotum and perineum (the area between the scrotum and anus). Both the swelling and the bruising should resolve in l or 2 weeks. Ice packs and over- the-counter medications such as Tylenol, Advil or Aleve may lessen your discomfort.  Postoperative urination Most men experience burning on urination and/or urinary frequency. If this becomes bothersome, contact your Urologist.  Medication can be prescribed to relieve these problems.  It is normal to have some blood in your urine for a few days after the implant.   Contact your doctor for Temperature greater than 101 F Increasing pain Inability to urinate  Follow-up  You should have follow up with your radiation oncologist  shortly after the procedure   Post Anesthesia Home Care Instructions  Activity: Get plenty of rest for the remainder of the day. A responsible individual must stay with you for 24 hours following the procedure.  For the next 24 hours, DO NOT: -Drive a car -Paediatric nurse -Drink alcoholic beverages -Take any medication unless instructed by your physician -Make any legal decisions or sign important papers.  Meals: Start with liquid foods such as gelatin or soup. Progress to regular foods as tolerated. Avoid greasy, spicy, heavy foods. If nausea and/or vomiting occur, drink only clear liquids until the nausea and/or vomiting subsides. Call your physician if vomiting continues.  Special Instructions/Symptoms: Your throat may feel dry or sore from the anesthesia or the breathing tube placed in your throat during surgery. If this causes discomfort, gargle with warm salt water. The discomfort should disappear within 24 hours.

## 2022-07-31 ENCOUNTER — Telehealth: Payer: Self-pay | Admitting: *Deleted

## 2022-07-31 ENCOUNTER — Encounter (HOSPITAL_BASED_OUTPATIENT_CLINIC_OR_DEPARTMENT_OTHER): Payer: Self-pay | Admitting: Urology

## 2022-07-31 NOTE — Telephone Encounter (Signed)
CALLED PATIENT TO REMIND OF SIM APPT. FOR 08-02-22- ARRIVAL TIME- 2:45 PM @ [facility], INFORMED PATIENT TO ARRIVE WITH A FULL BLADDER, SPOKE WITH PATIENT AND HE IS AWARE OF THESE APPTS. AND THE INSTRUCTIONS

## 2022-08-02 ENCOUNTER — Ambulatory Visit
Admission: RE | Admit: 2022-08-02 | Discharge: 2022-08-02 | Disposition: A | Discharge status: Home / Self Care | Payer: Medicare PPO | Source: Ambulatory Visit | Attending: Radiation Oncology | Admitting: Radiation Oncology

## 2022-08-02 DIAGNOSIS — C61 Malignant neoplasm of prostate: Secondary | ICD-10-CM | POA: Diagnosis not present

## 2022-08-02 DIAGNOSIS — Z510 Encounter for antineoplastic radiation therapy: Secondary | ICD-10-CM | POA: Insufficient documentation

## 2022-08-04 NOTE — Progress Notes (Signed)
  Radiation Oncology         [phone]) [phone] ________________________________  Name: James Ramsey MRN: [id number]  Date: 08/02/2022  DOB: [birth year]  SIMULATION AND TREATMENT PLANNING NOTE    ICD-10-CM   1. Malignant neoplasm of prostate ([place])  C61       DIAGNOSIS:   81 y.o. gentleman with Stage T1c adenocarcinoma of the prostate with Gleason score of 4+5, and PSA of 7.73.  NARRATIVE:  The patient was brought to the [facility].  Identity was confirmed.  All relevant records and images related to the planned course of therapy were reviewed.  The patient freely provided informed written consent to proceed with treatment after reviewing the details related to the planned course of therapy. The consent form was witnessed and verified by the simulation staff.  Then, the patient was set-up in a stable reproducible supine position for radiation therapy.  A vacuum lock pillow device was custom fabricated to position his legs in a reproducible immobilized position.  Then, I performed a urethrogram under sterile conditions to identify the prostatic bed.  CT images were obtained.  Surface markings were placed.  The CT images were loaded into the planning software.  Then the prostate bed target, pelvic lymph node target and avoidance structures including the rectum, bladder, bowel and hips were contoured.  Treatment planning then occurred.  The radiation prescription was entered and confirmed.  A total of one complex treatment devices were fabricated. I have requested : Intensity Modulated Radiotherapy (IMRT) is medically necessary for this case for the following reason:  Rectal sparing.Marland Kitchen  PLAN:  The patient will receive 45 Gy in 25 fractions of 1.8 Gy, followed by a boost to the prostate to a total dose of 75 Gy with 15 additional fractions of 2 Gy.   ________________________________  Sheral Apley Tammi Klippel, M.D.

## 2022-08-07 DIAGNOSIS — Z510 Encounter for antineoplastic radiation therapy: Secondary | ICD-10-CM | POA: Diagnosis not present

## 2022-08-14 ENCOUNTER — Other Ambulatory Visit: Payer: Self-pay

## 2022-08-14 ENCOUNTER — Ambulatory Visit
Admission: RE | Admit: 2022-08-14 | Discharge: 2022-08-14 | Disposition: A | Discharge status: Home / Self Care | Payer: Medicare PPO | Source: Ambulatory Visit | Attending: Radiation Oncology | Admitting: Radiation Oncology

## 2022-08-14 DIAGNOSIS — Z510 Encounter for antineoplastic radiation therapy: Secondary | ICD-10-CM | POA: Diagnosis not present

## 2022-08-14 LAB — RAD ONC ARIA SESSION SUMMARY
Course Elapsed Days: 0
Plan Fractions Treated to Date: 1
Plan Prescribed Dose Per Fraction: 1.8 Gy
Plan Total Fractions Prescribed: 25
Plan Total Prescribed Dose: 45 Gy
Reference Point Dosage Given to Date: 1.8 Gy
Reference Point Session Dosage Given: 1.8 Gy
Session Number: 1

## 2022-08-15 ENCOUNTER — Ambulatory Visit
Admission: RE | Admit: 2022-08-15 | Discharge: 2022-08-15 | Disposition: A | Discharge status: Home / Self Care | Payer: Medicare PPO | Source: Ambulatory Visit | Attending: Radiation Oncology | Admitting: Radiation Oncology

## 2022-08-15 ENCOUNTER — Other Ambulatory Visit: Payer: Self-pay

## 2022-08-15 DIAGNOSIS — Z510 Encounter for antineoplastic radiation therapy: Secondary | ICD-10-CM | POA: Diagnosis not present

## 2022-08-15 LAB — RAD ONC ARIA SESSION SUMMARY
Course Elapsed Days: 1
Plan Fractions Treated to Date: 2
Plan Prescribed Dose Per Fraction: 1.8 Gy
Plan Total Fractions Prescribed: 25
Plan Total Prescribed Dose: 45 Gy
Reference Point Dosage Given to Date: 3.6 Gy
Reference Point Session Dosage Given: 1.8 Gy
Session Number: 2

## 2022-08-16 ENCOUNTER — Ambulatory Visit
Admission: RE | Admit: 2022-08-16 | Discharge: 2022-08-16 | Disposition: A | Discharge status: Home / Self Care | Payer: Medicare PPO | Source: Ambulatory Visit | Attending: Radiation Oncology | Admitting: Radiation Oncology

## 2022-08-16 ENCOUNTER — Other Ambulatory Visit: Payer: Self-pay

## 2022-08-16 DIAGNOSIS — Z510 Encounter for antineoplastic radiation therapy: Secondary | ICD-10-CM | POA: Diagnosis not present

## 2022-08-16 LAB — RAD ONC ARIA SESSION SUMMARY
Course Elapsed Days: 2
Plan Fractions Treated to Date: 3
Plan Prescribed Dose Per Fraction: 1.8 Gy
Plan Total Fractions Prescribed: 25
Plan Total Prescribed Dose: 45 Gy
Reference Point Dosage Given to Date: 5.4 Gy
Reference Point Session Dosage Given: 1.8 Gy
Session Number: 3

## 2022-08-19 ENCOUNTER — Ambulatory Visit
Admission: RE | Admit: 2022-08-19 | Discharge: 2022-08-19 | Disposition: A | Discharge status: Home / Self Care | Payer: Medicare PPO | Source: Ambulatory Visit | Attending: Radiation Oncology | Admitting: Radiation Oncology

## 2022-08-19 ENCOUNTER — Other Ambulatory Visit: Payer: Self-pay

## 2022-08-19 DIAGNOSIS — Z510 Encounter for antineoplastic radiation therapy: Secondary | ICD-10-CM | POA: Diagnosis not present

## 2022-08-19 LAB — RAD ONC ARIA SESSION SUMMARY
Course Elapsed Days: 5
Plan Fractions Treated to Date: 4
Plan Prescribed Dose Per Fraction: 1.8 Gy
Plan Total Fractions Prescribed: 25
Plan Total Prescribed Dose: 45 Gy
Reference Point Dosage Given to Date: 7.2 Gy
Reference Point Session Dosage Given: 1.8 Gy
Session Number: 4

## 2022-08-20 ENCOUNTER — Other Ambulatory Visit: Payer: Self-pay

## 2022-08-20 ENCOUNTER — Ambulatory Visit
Admission: RE | Admit: 2022-08-20 | Discharge: 2022-08-20 | Disposition: A | Discharge status: Home / Self Care | Payer: Medicare PPO | Source: Ambulatory Visit | Attending: Radiation Oncology | Admitting: Radiation Oncology

## 2022-08-20 DIAGNOSIS — Z510 Encounter for antineoplastic radiation therapy: Secondary | ICD-10-CM | POA: Diagnosis not present

## 2022-08-20 LAB — RAD ONC ARIA SESSION SUMMARY
Course Elapsed Days: 6
Plan Fractions Treated to Date: 5
Plan Prescribed Dose Per Fraction: 1.8 Gy
Plan Total Fractions Prescribed: 25
Plan Total Prescribed Dose: 45 Gy
Reference Point Dosage Given to Date: 9 Gy
Reference Point Session Dosage Given: 1.8 Gy
Session Number: 5

## 2022-08-21 ENCOUNTER — Other Ambulatory Visit: Payer: Self-pay

## 2022-08-21 ENCOUNTER — Ambulatory Visit
Admission: RE | Admit: 2022-08-21 | Discharge: 2022-08-21 | Disposition: A | Discharge status: Home / Self Care | Payer: Medicare PPO | Source: Ambulatory Visit | Attending: Radiation Oncology | Admitting: Radiation Oncology

## 2022-08-21 DIAGNOSIS — Z510 Encounter for antineoplastic radiation therapy: Secondary | ICD-10-CM | POA: Diagnosis not present

## 2022-08-21 LAB — RAD ONC ARIA SESSION SUMMARY
Course Elapsed Days: 7
Plan Fractions Treated to Date: 6
Plan Prescribed Dose Per Fraction: 1.8 Gy
Plan Total Fractions Prescribed: 25
Plan Total Prescribed Dose: 45 Gy
Reference Point Dosage Given to Date: 10.8 Gy
Reference Point Session Dosage Given: 1.8 Gy
Session Number: 6

## 2022-08-22 ENCOUNTER — Other Ambulatory Visit: Payer: Self-pay

## 2022-08-22 ENCOUNTER — Ambulatory Visit
Admission: RE | Admit: 2022-08-22 | Discharge: 2022-08-22 | Disposition: A | Discharge status: Home / Self Care | Payer: Medicare PPO | Source: Ambulatory Visit | Attending: Radiation Oncology | Admitting: Radiation Oncology

## 2022-08-22 DIAGNOSIS — Z510 Encounter for antineoplastic radiation therapy: Secondary | ICD-10-CM | POA: Diagnosis not present

## 2022-08-22 LAB — RAD ONC ARIA SESSION SUMMARY
Course Elapsed Days: 8
Plan Fractions Treated to Date: 7
Plan Prescribed Dose Per Fraction: 1.8 Gy
Plan Total Fractions Prescribed: 25
Plan Total Prescribed Dose: 45 Gy
Reference Point Dosage Given to Date: 12.6 Gy
Reference Point Session Dosage Given: 1.8 Gy
Session Number: 7

## 2022-08-23 ENCOUNTER — Ambulatory Visit
Admission: RE | Admit: 2022-08-23 | Discharge: 2022-08-23 | Disposition: A | Discharge status: Home / Self Care | Payer: Medicare PPO | Source: Ambulatory Visit | Attending: Radiation Oncology | Admitting: Radiation Oncology

## 2022-08-23 ENCOUNTER — Other Ambulatory Visit: Payer: Self-pay

## 2022-08-23 DIAGNOSIS — Z510 Encounter for antineoplastic radiation therapy: Secondary | ICD-10-CM | POA: Diagnosis not present

## 2022-08-23 LAB — RAD ONC ARIA SESSION SUMMARY
Course Elapsed Days: 9
Plan Fractions Treated to Date: 8
Plan Prescribed Dose Per Fraction: 1.8 Gy
Plan Total Fractions Prescribed: 25
Plan Total Prescribed Dose: 45 Gy
Reference Point Dosage Given to Date: 14.4 Gy
Reference Point Session Dosage Given: 1.8 Gy
Session Number: 8

## 2022-08-24 ENCOUNTER — Other Ambulatory Visit: Payer: Self-pay | Admitting: Internal Medicine

## 2022-08-24 DIAGNOSIS — I480 Paroxysmal atrial fibrillation: Secondary | ICD-10-CM

## 2022-08-27 ENCOUNTER — Other Ambulatory Visit: Payer: Self-pay

## 2022-08-27 ENCOUNTER — Ambulatory Visit
Admission: RE | Admit: 2022-08-27 | Discharge: 2022-08-27 | Disposition: A | Discharge status: Home / Self Care | Payer: Medicare PPO | Source: Ambulatory Visit | Attending: Radiation Oncology | Admitting: Radiation Oncology

## 2022-08-27 DIAGNOSIS — Z510 Encounter for antineoplastic radiation therapy: Secondary | ICD-10-CM | POA: Diagnosis not present

## 2022-08-27 LAB — RAD ONC ARIA SESSION SUMMARY
Course Elapsed Days: 13
Plan Fractions Treated to Date: 9
Plan Prescribed Dose Per Fraction: 1.8 Gy
Plan Total Fractions Prescribed: 25
Plan Total Prescribed Dose: 45 Gy
Reference Point Dosage Given to Date: 16.2 Gy
Reference Point Session Dosage Given: 1.8 Gy
Session Number: 9

## 2022-08-28 ENCOUNTER — Ambulatory Visit
Admission: RE | Admit: 2022-08-28 | Discharge: 2022-08-28 | Disposition: A | Discharge status: Home / Self Care | Payer: Medicare PPO | Source: Ambulatory Visit | Attending: Radiation Oncology | Admitting: Radiation Oncology

## 2022-08-28 ENCOUNTER — Other Ambulatory Visit: Payer: Self-pay

## 2022-08-28 DIAGNOSIS — Z510 Encounter for antineoplastic radiation therapy: Secondary | ICD-10-CM | POA: Diagnosis not present

## 2022-08-28 LAB — RAD ONC ARIA SESSION SUMMARY
Course Elapsed Days: 14
Plan Fractions Treated to Date: 10
Plan Prescribed Dose Per Fraction: 1.8 Gy
Plan Total Fractions Prescribed: 25
Plan Total Prescribed Dose: 45 Gy
Reference Point Dosage Given to Date: 18 Gy
Reference Point Session Dosage Given: 1.8 Gy
Session Number: 10

## 2022-08-29 ENCOUNTER — Ambulatory Visit
Admission: RE | Admit: 2022-08-29 | Discharge: 2022-08-29 | Disposition: A | Discharge status: Home / Self Care | Payer: Medicare PPO | Source: Ambulatory Visit | Attending: Radiation Oncology | Admitting: Radiation Oncology

## 2022-08-29 ENCOUNTER — Other Ambulatory Visit: Payer: Self-pay

## 2022-08-29 DIAGNOSIS — Z510 Encounter for antineoplastic radiation therapy: Secondary | ICD-10-CM | POA: Diagnosis not present

## 2022-08-29 LAB — RAD ONC ARIA SESSION SUMMARY
Course Elapsed Days: 15
Plan Fractions Treated to Date: 11
Plan Prescribed Dose Per Fraction: 1.8 Gy
Plan Total Fractions Prescribed: 25
Plan Total Prescribed Dose: 45 Gy
Reference Point Dosage Given to Date: 19.8 Gy
Reference Point Session Dosage Given: 1.8 Gy
Session Number: 11

## 2022-08-30 ENCOUNTER — Ambulatory Visit
Admission: RE | Admit: 2022-08-30 | Discharge: 2022-08-30 | Disposition: A | Discharge status: Home / Self Care | Payer: Medicare PPO | Source: Ambulatory Visit | Attending: Radiation Oncology | Admitting: Radiation Oncology

## 2022-08-30 ENCOUNTER — Other Ambulatory Visit: Payer: Self-pay

## 2022-08-30 DIAGNOSIS — Z510 Encounter for antineoplastic radiation therapy: Secondary | ICD-10-CM | POA: Diagnosis not present

## 2022-08-30 LAB — RAD ONC ARIA SESSION SUMMARY
Course Elapsed Days: 16
Plan Fractions Treated to Date: 12
Plan Prescribed Dose Per Fraction: 1.8 Gy
Plan Total Fractions Prescribed: 25
Plan Total Prescribed Dose: 45 Gy
Reference Point Dosage Given to Date: 21.6 Gy
Reference Point Session Dosage Given: 1.8 Gy
Session Number: 12

## 2022-09-03 ENCOUNTER — Other Ambulatory Visit: Payer: Self-pay

## 2022-09-03 ENCOUNTER — Ambulatory Visit
Admission: RE | Admit: 2022-09-03 | Discharge: 2022-09-03 | Disposition: A | Discharge status: Home / Self Care | Payer: Medicare PPO | Source: Ambulatory Visit | Attending: Radiation Oncology | Admitting: Radiation Oncology

## 2022-09-03 DIAGNOSIS — C61 Malignant neoplasm of prostate: Secondary | ICD-10-CM | POA: Diagnosis not present

## 2022-09-03 DIAGNOSIS — Z510 Encounter for antineoplastic radiation therapy: Secondary | ICD-10-CM | POA: Insufficient documentation

## 2022-09-03 LAB — RAD ONC ARIA SESSION SUMMARY
Course Elapsed Days: 20
Plan Fractions Treated to Date: 13
Plan Prescribed Dose Per Fraction: 1.8 Gy
Plan Total Fractions Prescribed: 25
Plan Total Prescribed Dose: 45 Gy
Reference Point Dosage Given to Date: 23.4 Gy
Reference Point Session Dosage Given: 1.8 Gy
Session Number: 13

## 2022-09-04 ENCOUNTER — Ambulatory Visit
Admission: RE | Admit: 2022-09-04 | Discharge: 2022-09-04 | Disposition: A | Discharge status: Home / Self Care | Payer: Medicare PPO | Source: Ambulatory Visit | Attending: Radiation Oncology | Admitting: Radiation Oncology

## 2022-09-04 ENCOUNTER — Other Ambulatory Visit: Payer: Self-pay

## 2022-09-04 DIAGNOSIS — Z510 Encounter for antineoplastic radiation therapy: Secondary | ICD-10-CM | POA: Diagnosis not present

## 2022-09-04 LAB — RAD ONC ARIA SESSION SUMMARY
Course Elapsed Days: 21
Plan Fractions Treated to Date: 14
Plan Prescribed Dose Per Fraction: 1.8 Gy
Plan Total Fractions Prescribed: 25
Plan Total Prescribed Dose: 45 Gy
Reference Point Dosage Given to Date: 25.2 Gy
Reference Point Session Dosage Given: 1.8 Gy
Session Number: 14

## 2022-09-05 ENCOUNTER — Other Ambulatory Visit: Payer: Self-pay

## 2022-09-05 ENCOUNTER — Ambulatory Visit
Admission: RE | Admit: 2022-09-05 | Discharge: 2022-09-05 | Disposition: A | Discharge status: Home / Self Care | Payer: Medicare PPO | Source: Ambulatory Visit | Attending: Radiation Oncology | Admitting: Radiation Oncology

## 2022-09-05 DIAGNOSIS — Z510 Encounter for antineoplastic radiation therapy: Secondary | ICD-10-CM | POA: Diagnosis not present

## 2022-09-05 LAB — RAD ONC ARIA SESSION SUMMARY
Course Elapsed Days: 22
Plan Fractions Treated to Date: 15
Plan Prescribed Dose Per Fraction: 1.8 Gy
Plan Total Fractions Prescribed: 25
Plan Total Prescribed Dose: 45 Gy
Reference Point Dosage Given to Date: 27 Gy
Reference Point Session Dosage Given: 1.8 Gy
Session Number: 15

## 2022-09-06 ENCOUNTER — Other Ambulatory Visit: Payer: Self-pay

## 2022-09-06 ENCOUNTER — Ambulatory Visit
Admission: RE | Admit: 2022-09-06 | Discharge: 2022-09-06 | Disposition: A | Discharge status: Home / Self Care | Payer: Medicare PPO | Source: Ambulatory Visit | Attending: Radiation Oncology | Admitting: Radiation Oncology

## 2022-09-06 DIAGNOSIS — Z510 Encounter for antineoplastic radiation therapy: Secondary | ICD-10-CM | POA: Diagnosis not present

## 2022-09-06 LAB — RAD ONC ARIA SESSION SUMMARY
Course Elapsed Days: 23
Plan Fractions Treated to Date: 16
Plan Prescribed Dose Per Fraction: 1.8 Gy
Plan Total Fractions Prescribed: 25
Plan Total Prescribed Dose: 45 Gy
Reference Point Dosage Given to Date: 28.8 Gy
Reference Point Session Dosage Given: 1.8 Gy
Session Number: 16

## 2022-09-09 ENCOUNTER — Other Ambulatory Visit: Payer: Self-pay

## 2022-09-09 ENCOUNTER — Ambulatory Visit
Admission: RE | Admit: 2022-09-09 | Discharge: 2022-09-09 | Disposition: A | Discharge status: Home / Self Care | Payer: Medicare PPO | Source: Ambulatory Visit | Attending: Radiation Oncology | Admitting: Radiation Oncology

## 2022-09-09 DIAGNOSIS — Z510 Encounter for antineoplastic radiation therapy: Secondary | ICD-10-CM | POA: Diagnosis not present

## 2022-09-09 LAB — RAD ONC ARIA SESSION SUMMARY
Course Elapsed Days: 26
Plan Fractions Treated to Date: 17
Plan Prescribed Dose Per Fraction: 1.8 Gy
Plan Total Fractions Prescribed: 25
Plan Total Prescribed Dose: 45 Gy
Reference Point Dosage Given to Date: 30.6 Gy
Reference Point Session Dosage Given: 1.8 Gy
Session Number: 17

## 2022-09-10 ENCOUNTER — Ambulatory Visit
Admission: RE | Admit: 2022-09-10 | Discharge: 2022-09-10 | Disposition: A | Discharge status: Home / Self Care | Payer: Medicare PPO | Source: Ambulatory Visit | Attending: Radiation Oncology | Admitting: Radiation Oncology

## 2022-09-10 ENCOUNTER — Other Ambulatory Visit: Payer: Self-pay

## 2022-09-10 DIAGNOSIS — Z510 Encounter for antineoplastic radiation therapy: Secondary | ICD-10-CM | POA: Diagnosis not present

## 2022-09-10 LAB — RAD ONC ARIA SESSION SUMMARY
Course Elapsed Days: 27
Plan Fractions Treated to Date: 18
Plan Prescribed Dose Per Fraction: 1.8 Gy
Plan Total Fractions Prescribed: 25
Plan Total Prescribed Dose: 45 Gy
Reference Point Dosage Given to Date: 32.4 Gy
Reference Point Session Dosage Given: 1.8 Gy
Session Number: 18

## 2022-09-11 ENCOUNTER — Ambulatory Visit
Admission: RE | Admit: 2022-09-11 | Discharge: 2022-09-11 | Disposition: A | Discharge status: Home / Self Care | Payer: Medicare PPO | Source: Ambulatory Visit | Attending: Radiation Oncology | Admitting: Radiation Oncology

## 2022-09-11 ENCOUNTER — Other Ambulatory Visit: Payer: Self-pay

## 2022-09-11 DIAGNOSIS — Z510 Encounter for antineoplastic radiation therapy: Secondary | ICD-10-CM | POA: Diagnosis not present

## 2022-09-11 LAB — RAD ONC ARIA SESSION SUMMARY
Course Elapsed Days: 28
Plan Fractions Treated to Date: 19
Plan Prescribed Dose Per Fraction: 1.8 Gy
Plan Total Fractions Prescribed: 25
Plan Total Prescribed Dose: 45 Gy
Reference Point Dosage Given to Date: 34.2 Gy
Reference Point Session Dosage Given: 1.8 Gy
Session Number: 19

## 2022-09-12 ENCOUNTER — Other Ambulatory Visit: Payer: Self-pay

## 2022-09-12 ENCOUNTER — Ambulatory Visit
Admission: RE | Admit: 2022-09-12 | Discharge: 2022-09-12 | Disposition: A | Discharge status: Home / Self Care | Payer: Medicare PPO | Source: Ambulatory Visit | Attending: Radiation Oncology | Admitting: Radiation Oncology

## 2022-09-12 DIAGNOSIS — Z510 Encounter for antineoplastic radiation therapy: Secondary | ICD-10-CM | POA: Diagnosis not present

## 2022-09-12 LAB — RAD ONC ARIA SESSION SUMMARY
Course Elapsed Days: 29
Plan Fractions Treated to Date: 20
Plan Prescribed Dose Per Fraction: 1.8 Gy
Plan Total Fractions Prescribed: 25
Plan Total Prescribed Dose: 45 Gy
Reference Point Dosage Given to Date: 36 Gy
Reference Point Session Dosage Given: 1.8 Gy
Session Number: 20

## 2022-09-13 ENCOUNTER — Ambulatory Visit
Admission: RE | Admit: 2022-09-13 | Discharge: 2022-09-13 | Disposition: A | Discharge status: Home / Self Care | Payer: Medicare PPO | Source: Ambulatory Visit | Attending: Radiation Oncology | Admitting: Radiation Oncology

## 2022-09-13 ENCOUNTER — Other Ambulatory Visit: Payer: Self-pay

## 2022-09-13 DIAGNOSIS — Z510 Encounter for antineoplastic radiation therapy: Secondary | ICD-10-CM | POA: Diagnosis not present

## 2022-09-13 LAB — RAD ONC ARIA SESSION SUMMARY
Course Elapsed Days: 30
Plan Fractions Treated to Date: 21
Plan Prescribed Dose Per Fraction: 1.8 Gy
Plan Total Fractions Prescribed: 25
Plan Total Prescribed Dose: 45 Gy
Reference Point Dosage Given to Date: 37.8 Gy
Reference Point Session Dosage Given: 1.8 Gy
Session Number: 21

## 2022-09-16 ENCOUNTER — Other Ambulatory Visit: Payer: Self-pay

## 2022-09-16 ENCOUNTER — Ambulatory Visit
Admission: RE | Admit: 2022-09-16 | Discharge: 2022-09-16 | Disposition: A | Discharge status: Home / Self Care | Payer: Medicare PPO | Source: Ambulatory Visit | Attending: Radiation Oncology | Admitting: Radiation Oncology

## 2022-09-16 DIAGNOSIS — Z510 Encounter for antineoplastic radiation therapy: Secondary | ICD-10-CM | POA: Diagnosis not present

## 2022-09-16 LAB — RAD ONC ARIA SESSION SUMMARY
Course Elapsed Days: 33
Plan Fractions Treated to Date: 22
Plan Prescribed Dose Per Fraction: 1.8 Gy
Plan Total Fractions Prescribed: 25
Plan Total Prescribed Dose: 45 Gy
Reference Point Dosage Given to Date: 39.6 Gy
Reference Point Session Dosage Given: 1.8 Gy
Session Number: 22

## 2022-09-17 ENCOUNTER — Other Ambulatory Visit: Payer: Self-pay

## 2022-09-17 ENCOUNTER — Ambulatory Visit
Admission: RE | Admit: 2022-09-17 | Discharge: 2022-09-17 | Disposition: A | Discharge status: Home / Self Care | Payer: Medicare PPO | Source: Ambulatory Visit | Attending: Radiation Oncology | Admitting: Radiation Oncology

## 2022-09-17 DIAGNOSIS — Z510 Encounter for antineoplastic radiation therapy: Secondary | ICD-10-CM | POA: Diagnosis not present

## 2022-09-17 LAB — RAD ONC ARIA SESSION SUMMARY
Course Elapsed Days: 34
Plan Fractions Treated to Date: 23
Plan Prescribed Dose Per Fraction: 1.8 Gy
Plan Total Fractions Prescribed: 25
Plan Total Prescribed Dose: 45 Gy
Reference Point Dosage Given to Date: 41.4 Gy
Reference Point Session Dosage Given: 1.8 Gy
Session Number: 23

## 2022-09-18 ENCOUNTER — Other Ambulatory Visit: Payer: Self-pay

## 2022-09-18 ENCOUNTER — Ambulatory Visit
Admission: RE | Admit: 2022-09-18 | Discharge: 2022-09-18 | Disposition: A | Discharge status: Home / Self Care | Payer: Medicare PPO | Source: Ambulatory Visit | Attending: Radiation Oncology | Admitting: Radiation Oncology

## 2022-09-18 DIAGNOSIS — Z510 Encounter for antineoplastic radiation therapy: Secondary | ICD-10-CM | POA: Diagnosis not present

## 2022-09-18 LAB — RAD ONC ARIA SESSION SUMMARY
Course Elapsed Days: 35
Plan Fractions Treated to Date: 24
Plan Prescribed Dose Per Fraction: 1.8 Gy
Plan Total Fractions Prescribed: 25
Plan Total Prescribed Dose: 45 Gy
Reference Point Dosage Given to Date: 43.2 Gy
Reference Point Session Dosage Given: 1.8 Gy
Session Number: 24

## 2022-09-19 ENCOUNTER — Ambulatory Visit: Payer: Medicare PPO

## 2022-09-19 ENCOUNTER — Ambulatory Visit
Admission: RE | Admit: 2022-09-19 | Discharge: 2022-09-19 | Disposition: A | Discharge status: Home / Self Care | Payer: Medicare PPO | Source: Ambulatory Visit | Attending: Radiation Oncology | Admitting: Radiation Oncology

## 2022-09-19 ENCOUNTER — Other Ambulatory Visit: Payer: Self-pay

## 2022-09-19 DIAGNOSIS — Z510 Encounter for antineoplastic radiation therapy: Secondary | ICD-10-CM | POA: Diagnosis not present

## 2022-09-19 LAB — RAD ONC ARIA SESSION SUMMARY
Course Elapsed Days: 36
Plan Fractions Treated to Date: 25
Plan Prescribed Dose Per Fraction: 1.8 Gy
Plan Total Fractions Prescribed: 25
Plan Total Prescribed Dose: 45 Gy
Reference Point Dosage Given to Date: 45 Gy
Reference Point Session Dosage Given: 1.8 Gy
Session Number: 25

## 2022-09-20 ENCOUNTER — Ambulatory Visit: Payer: Medicare PPO

## 2022-09-20 ENCOUNTER — Other Ambulatory Visit: Payer: Self-pay

## 2022-09-20 ENCOUNTER — Ambulatory Visit
Admission: RE | Admit: 2022-09-20 | Discharge: 2022-09-20 | Disposition: A | Discharge status: Home / Self Care | Payer: Medicare PPO | Source: Ambulatory Visit | Attending: Radiation Oncology | Admitting: Radiation Oncology

## 2022-09-20 DIAGNOSIS — Z510 Encounter for antineoplastic radiation therapy: Secondary | ICD-10-CM | POA: Diagnosis not present

## 2022-09-20 LAB — RAD ONC ARIA SESSION SUMMARY
Course Elapsed Days: 37
Plan Fractions Treated to Date: 1
Plan Prescribed Dose Per Fraction: 2 Gy
Plan Total Fractions Prescribed: 15
Plan Total Prescribed Dose: 30 Gy
Reference Point Dosage Given to Date: 2 Gy
Reference Point Session Dosage Given: 2 Gy
Session Number: 26

## 2022-09-23 ENCOUNTER — Ambulatory Visit
Admission: RE | Admit: 2022-09-23 | Discharge: 2022-09-23 | Disposition: A | Discharge status: Home / Self Care | Payer: Medicare PPO | Source: Ambulatory Visit | Attending: Radiation Oncology | Admitting: Radiation Oncology

## 2022-09-23 ENCOUNTER — Other Ambulatory Visit: Payer: Self-pay

## 2022-09-23 DIAGNOSIS — Z510 Encounter for antineoplastic radiation therapy: Secondary | ICD-10-CM | POA: Diagnosis not present

## 2022-09-23 LAB — RAD ONC ARIA SESSION SUMMARY
Course Elapsed Days: 40
Plan Fractions Treated to Date: 2
Plan Prescribed Dose Per Fraction: 2 Gy
Plan Total Fractions Prescribed: 15
Plan Total Prescribed Dose: 30 Gy
Reference Point Dosage Given to Date: 4 Gy
Reference Point Session Dosage Given: 2 Gy
Session Number: 27

## 2022-09-24 ENCOUNTER — Other Ambulatory Visit: Payer: Self-pay

## 2022-09-24 ENCOUNTER — Ambulatory Visit
Admission: RE | Admit: 2022-09-24 | Discharge: 2022-09-24 | Disposition: A | Discharge status: Home / Self Care | Payer: Medicare PPO | Source: Ambulatory Visit | Attending: Radiation Oncology | Admitting: Radiation Oncology

## 2022-09-24 DIAGNOSIS — Z510 Encounter for antineoplastic radiation therapy: Secondary | ICD-10-CM | POA: Diagnosis not present

## 2022-09-24 LAB — RAD ONC ARIA SESSION SUMMARY
Course Elapsed Days: 41
Plan Fractions Treated to Date: 3
Plan Prescribed Dose Per Fraction: 2 Gy
Plan Total Fractions Prescribed: 15
Plan Total Prescribed Dose: 30 Gy
Reference Point Dosage Given to Date: 6 Gy
Reference Point Session Dosage Given: 2 Gy
Session Number: 28

## 2022-09-25 ENCOUNTER — Ambulatory Visit
Admission: RE | Admit: 2022-09-25 | Discharge: 2022-09-25 | Disposition: A | Discharge status: Home / Self Care | Payer: Medicare PPO | Source: Ambulatory Visit | Attending: Radiation Oncology | Admitting: Radiation Oncology

## 2022-09-25 ENCOUNTER — Other Ambulatory Visit: Payer: Self-pay

## 2022-09-25 DIAGNOSIS — Z510 Encounter for antineoplastic radiation therapy: Secondary | ICD-10-CM | POA: Diagnosis not present

## 2022-09-25 LAB — RAD ONC ARIA SESSION SUMMARY
Course Elapsed Days: 42
Plan Fractions Treated to Date: 4
Plan Prescribed Dose Per Fraction: 2 Gy
Plan Total Fractions Prescribed: 15
Plan Total Prescribed Dose: 30 Gy
Reference Point Dosage Given to Date: 8 Gy
Reference Point Session Dosage Given: 2 Gy
Session Number: 29

## 2022-09-26 ENCOUNTER — Other Ambulatory Visit: Payer: Self-pay

## 2022-09-26 ENCOUNTER — Ambulatory Visit
Admission: RE | Admit: 2022-09-26 | Discharge: 2022-09-26 | Disposition: A | Discharge status: Home / Self Care | Payer: Medicare PPO | Source: Ambulatory Visit | Attending: Radiation Oncology | Admitting: Radiation Oncology

## 2022-09-26 DIAGNOSIS — Z510 Encounter for antineoplastic radiation therapy: Secondary | ICD-10-CM | POA: Diagnosis not present

## 2022-09-26 LAB — RAD ONC ARIA SESSION SUMMARY
Course Elapsed Days: 43
Plan Fractions Treated to Date: 5
Plan Prescribed Dose Per Fraction: 2 Gy
Plan Total Fractions Prescribed: 15
Plan Total Prescribed Dose: 30 Gy
Reference Point Dosage Given to Date: 10 Gy
Reference Point Session Dosage Given: 2 Gy
Session Number: 30

## 2022-09-27 ENCOUNTER — Other Ambulatory Visit: Payer: Self-pay

## 2022-09-27 ENCOUNTER — Ambulatory Visit
Admission: RE | Admit: 2022-09-27 | Discharge: 2022-09-27 | Disposition: A | Discharge status: Home / Self Care | Payer: Medicare PPO | Source: Ambulatory Visit | Attending: Radiation Oncology | Admitting: Radiation Oncology

## 2022-09-27 DIAGNOSIS — Z510 Encounter for antineoplastic radiation therapy: Secondary | ICD-10-CM | POA: Diagnosis not present

## 2022-09-27 LAB — RAD ONC ARIA SESSION SUMMARY
Course Elapsed Days: 44
Plan Fractions Treated to Date: 6
Plan Prescribed Dose Per Fraction: 2 Gy
Plan Total Fractions Prescribed: 15
Plan Total Prescribed Dose: 30 Gy
Reference Point Dosage Given to Date: 12 Gy
Reference Point Session Dosage Given: 2 Gy
Session Number: 31

## 2022-09-30 ENCOUNTER — Other Ambulatory Visit: Payer: Self-pay

## 2022-09-30 ENCOUNTER — Ambulatory Visit
Admission: RE | Admit: 2022-09-30 | Discharge: 2022-09-30 | Disposition: A | Discharge status: Home / Self Care | Payer: Medicare PPO | Source: Ambulatory Visit | Attending: Radiation Oncology | Admitting: Radiation Oncology

## 2022-09-30 DIAGNOSIS — Z510 Encounter for antineoplastic radiation therapy: Secondary | ICD-10-CM | POA: Diagnosis not present

## 2022-09-30 LAB — RAD ONC ARIA SESSION SUMMARY
Course Elapsed Days: 47
Plan Fractions Treated to Date: 7
Plan Prescribed Dose Per Fraction: 2 Gy
Plan Total Fractions Prescribed: 15
Plan Total Prescribed Dose: 30 Gy
Reference Point Dosage Given to Date: 14 Gy
Reference Point Session Dosage Given: 2 Gy
Session Number: 32

## 2022-10-01 ENCOUNTER — Ambulatory Visit
Admission: RE | Admit: 2022-10-01 | Discharge: 2022-10-01 | Disposition: A | Discharge status: Home / Self Care | Payer: Medicare PPO | Source: Ambulatory Visit | Attending: Radiation Oncology | Admitting: Radiation Oncology

## 2022-10-01 ENCOUNTER — Other Ambulatory Visit: Payer: Self-pay

## 2022-10-01 DIAGNOSIS — Z510 Encounter for antineoplastic radiation therapy: Secondary | ICD-10-CM | POA: Diagnosis not present

## 2022-10-01 LAB — RAD ONC ARIA SESSION SUMMARY
Course Elapsed Days: 48
Plan Fractions Treated to Date: 8
Plan Prescribed Dose Per Fraction: 2 Gy
Plan Total Fractions Prescribed: 15
Plan Total Prescribed Dose: 30 Gy
Reference Point Dosage Given to Date: 16 Gy
Reference Point Session Dosage Given: 2 Gy
Session Number: 33

## 2022-10-02 ENCOUNTER — Other Ambulatory Visit: Payer: Self-pay

## 2022-10-02 ENCOUNTER — Ambulatory Visit
Admission: RE | Admit: 2022-10-02 | Discharge: 2022-10-02 | Disposition: A | Discharge status: Home / Self Care | Payer: Medicare PPO | Source: Ambulatory Visit | Attending: Radiation Oncology | Admitting: Radiation Oncology

## 2022-10-02 DIAGNOSIS — Z510 Encounter for antineoplastic radiation therapy: Secondary | ICD-10-CM | POA: Diagnosis not present

## 2022-10-02 LAB — RAD ONC ARIA SESSION SUMMARY
Course Elapsed Days: 49
Plan Fractions Treated to Date: 9
Plan Prescribed Dose Per Fraction: 2 Gy
Plan Total Fractions Prescribed: 15
Plan Total Prescribed Dose: 30 Gy
Reference Point Dosage Given to Date: 18 Gy
Reference Point Session Dosage Given: 2 Gy
Session Number: 34

## 2022-10-03 ENCOUNTER — Other Ambulatory Visit: Payer: Self-pay

## 2022-10-03 ENCOUNTER — Ambulatory Visit
Admission: RE | Admit: 2022-10-03 | Discharge: 2022-10-03 | Disposition: A | Discharge status: Home / Self Care | Payer: Medicare PPO | Source: Ambulatory Visit | Attending: Radiation Oncology | Admitting: Radiation Oncology

## 2022-10-03 DIAGNOSIS — Z510 Encounter for antineoplastic radiation therapy: Secondary | ICD-10-CM | POA: Diagnosis present

## 2022-10-03 DIAGNOSIS — C61 Malignant neoplasm of prostate: Secondary | ICD-10-CM | POA: Diagnosis not present

## 2022-10-03 LAB — RAD ONC ARIA SESSION SUMMARY
Course Elapsed Days: 50
Plan Fractions Treated to Date: 10
Plan Prescribed Dose Per Fraction: 2 Gy
Plan Total Fractions Prescribed: 15
Plan Total Prescribed Dose: 30 Gy
Reference Point Dosage Given to Date: 20 Gy
Reference Point Session Dosage Given: 2 Gy
Session Number: 35

## 2022-10-04 ENCOUNTER — Other Ambulatory Visit: Payer: Self-pay

## 2022-10-04 ENCOUNTER — Ambulatory Visit
Admission: RE | Admit: 2022-10-04 | Discharge: 2022-10-04 | Disposition: A | Discharge status: Home / Self Care | Payer: Medicare PPO | Source: Ambulatory Visit | Attending: Radiation Oncology | Admitting: Radiation Oncology

## 2022-10-04 DIAGNOSIS — Z510 Encounter for antineoplastic radiation therapy: Secondary | ICD-10-CM | POA: Diagnosis not present

## 2022-10-04 LAB — RAD ONC ARIA SESSION SUMMARY
Course Elapsed Days: 51
Plan Fractions Treated to Date: 11
Plan Prescribed Dose Per Fraction: 2 Gy
Plan Total Fractions Prescribed: 15
Plan Total Prescribed Dose: 30 Gy
Reference Point Dosage Given to Date: 22 Gy
Reference Point Session Dosage Given: 2 Gy
Session Number: 36

## 2022-10-07 ENCOUNTER — Ambulatory Visit
Admission: RE | Admit: 2022-10-07 | Discharge: 2022-10-07 | Disposition: A | Discharge status: Home / Self Care | Payer: Medicare PPO | Source: Ambulatory Visit | Attending: Radiation Oncology | Admitting: Radiation Oncology

## 2022-10-07 ENCOUNTER — Other Ambulatory Visit: Payer: Self-pay

## 2022-10-07 DIAGNOSIS — Z510 Encounter for antineoplastic radiation therapy: Secondary | ICD-10-CM | POA: Diagnosis not present

## 2022-10-07 LAB — RAD ONC ARIA SESSION SUMMARY
Course Elapsed Days: 54
Plan Fractions Treated to Date: 12
Plan Prescribed Dose Per Fraction: 2 Gy
Plan Total Fractions Prescribed: 15
Plan Total Prescribed Dose: 30 Gy
Reference Point Dosage Given to Date: 24 Gy
Reference Point Session Dosage Given: 2 Gy
Session Number: 37

## 2022-10-08 ENCOUNTER — Ambulatory Visit
Admission: RE | Admit: 2022-10-08 | Discharge: 2022-10-08 | Disposition: A | Discharge status: Home / Self Care | Payer: Medicare PPO | Source: Ambulatory Visit | Attending: Radiation Oncology | Admitting: Radiation Oncology

## 2022-10-08 ENCOUNTER — Other Ambulatory Visit: Payer: Self-pay

## 2022-10-08 DIAGNOSIS — Z510 Encounter for antineoplastic radiation therapy: Secondary | ICD-10-CM | POA: Diagnosis not present

## 2022-10-08 LAB — RAD ONC ARIA SESSION SUMMARY
Course Elapsed Days: 55
Plan Fractions Treated to Date: 13
Plan Prescribed Dose Per Fraction: 2 Gy
Plan Total Fractions Prescribed: 15
Plan Total Prescribed Dose: 30 Gy
Reference Point Dosage Given to Date: 26 Gy
Reference Point Session Dosage Given: 2 Gy
Session Number: 38

## 2022-10-09 ENCOUNTER — Ambulatory Visit
Admission: RE | Admit: 2022-10-09 | Discharge: 2022-10-09 | Disposition: A | Discharge status: Home / Self Care | Payer: Medicare PPO | Source: Ambulatory Visit | Attending: Radiation Oncology | Admitting: Radiation Oncology

## 2022-10-09 ENCOUNTER — Other Ambulatory Visit: Payer: Self-pay

## 2022-10-09 DIAGNOSIS — Z510 Encounter for antineoplastic radiation therapy: Secondary | ICD-10-CM | POA: Diagnosis not present

## 2022-10-09 LAB — RAD ONC ARIA SESSION SUMMARY
Course Elapsed Days: 56
Plan Fractions Treated to Date: 14
Plan Prescribed Dose Per Fraction: 2 Gy
Plan Total Fractions Prescribed: 15
Plan Total Prescribed Dose: 30 Gy
Reference Point Dosage Given to Date: 28 Gy
Reference Point Session Dosage Given: 2 Gy
Session Number: 39

## 2022-10-10 ENCOUNTER — Ambulatory Visit
Admission: RE | Admit: 2022-10-10 | Discharge: 2022-10-10 | Disposition: A | Discharge status: Home / Self Care | Payer: Medicare PPO | Source: Ambulatory Visit | Attending: Radiation Oncology | Admitting: Radiation Oncology

## 2022-10-10 ENCOUNTER — Ambulatory Visit: Payer: Medicare PPO

## 2022-10-10 ENCOUNTER — Other Ambulatory Visit: Payer: Self-pay

## 2022-10-10 ENCOUNTER — Encounter: Payer: Self-pay | Admitting: Urology

## 2022-10-10 DIAGNOSIS — Z510 Encounter for antineoplastic radiation therapy: Secondary | ICD-10-CM | POA: Diagnosis not present

## 2022-10-10 LAB — RAD ONC ARIA SESSION SUMMARY
Course Elapsed Days: 57
Plan Fractions Treated to Date: 15
Plan Prescribed Dose Per Fraction: 2 Gy
Plan Total Fractions Prescribed: 15
Plan Total Prescribed Dose: 30 Gy
Reference Point Dosage Given to Date: 30 Gy
Reference Point Session Dosage Given: 2 Gy
Session Number: 40

## 2022-10-10 NOTE — Progress Notes (Signed)
RN left voicemail for patient to call back to regarding getting appointment to continue his ADT.

## 2022-10-11 NOTE — Progress Notes (Signed)
Patient was a RadOnc Consult on 9/20 for his stage T1c adenocarcinoma of the prostate with Gleason score of 4+5, and PSA of 7.73 and proceed with 8 weeks of external beam therapy concurrent with LT-ADT.   Patient completed his radiation treatment on 2/8, and his last Eligard injection was 05/23/2022.   RN coordinated for additional Eligard on 3/25 @ 1:30pm. Reviewed with patient and he is aware of appointment, as well as post treatment telephone visit on 11/12/2022.   No additional needs at this time.

## 2022-10-31 ENCOUNTER — Other Ambulatory Visit: Payer: Self-pay | Admitting: Urology

## 2022-10-31 DIAGNOSIS — C61 Malignant neoplasm of prostate: Secondary | ICD-10-CM

## 2022-10-31 NOTE — Progress Notes (Signed)
                                                                                                                                                             Patient Name: James Ramsey MRN: [id number] DOB: October 07, 1940 Referring Physician: Conception Oms Winter Date of Service: 10/10/2022 [facility], [place]                                                        End Of Treatment Note  Diagnoses: C61-Malignant neoplasm of prostate  Cancer Staging: 82 y.o. gentleman with Stage T1c adenocarcinoma of the prostate with Gleason score of 4+5, and PSA of 7.73.   Intent: Curative  Radiation Treatment Dates: 08/14/2022 through 10/10/2022 Site Technique Total Dose (Gy) Dose per Fx (Gy) Completed Fx Beam Energies  Prostate: Prostate_Pelvis IMRT 45/45 1.8 25/25 6X  Prostate: Prostate_Bst_IMRT IMRT 30/30 2 15/15 6X   Narrative: The patient tolerated radiation therapy relatively well.  He did report increased frequency and urgency as well as modest fatigue.  He also reported frequent, soft stools but denied abdominal pain or diarrhea.  He was diagnosed with COVID towards the very end of treatment and fortunately, only reported mild upper respiratory symptoms associated with the COVID infection.  Plan: The patient will receive a call in about one month from the radiation oncology department. He will continue follow up with his urologist, Dr. Lovena Neighbours as well.  ------------------------------------------------   Tyler Pita, MD [place]: [phone]  Fax: [phone] [email or web address]  Skype  [email or web address]

## 2022-11-12 ENCOUNTER — Ambulatory Visit
Admission: RE | Admit: 2022-11-12 | Discharge: 2022-11-12 | Disposition: A | Discharge status: Home / Self Care | Payer: Medicare PPO | Source: Ambulatory Visit | Attending: Radiation Oncology | Admitting: Radiation Oncology

## 2022-11-12 DIAGNOSIS — C61 Malignant neoplasm of prostate: Secondary | ICD-10-CM | POA: Insufficient documentation

## 2022-11-12 DIAGNOSIS — Z510 Encounter for antineoplastic radiation therapy: Secondary | ICD-10-CM | POA: Insufficient documentation

## 2022-11-12 NOTE — Progress Notes (Signed)
  Radiation Oncology         [phone]) [phone] ________________________________  Name: James Ramsey MRN: [id number]  Date of Service: 11/12/2022  DOB: 07-14-1941  Post Treatment Telephone Note  Diagnosis:  82 y.o. gentleman with Stage T1c adenocarcinoma of the prostate with Gleason score of 4+5, and PSA of 7.73.   Intent: Curative  Radiation Treatment Dates: 08/14/2022 through 10/10/2022 Site Technique Total Dose (Gy) Dose per Fx (Gy) Completed Fx Beam Energies  Prostate: Prostate_Pelvis IMRT 45/45 1.8 25/25 6X  Prostate: Prostate_Bst_IMRT IMRT 30/30 2 15/15 6X   (as documented in provider EOT note)   Pre Treatment IPSS Score: 3 (as documented in the provider consult note)   The patient was not available for call today. Detailed voicemail left.  Patient (has a scheduled follow up visit with his urologist, Dr. Lovena Neighbours, on 01/2023 for ongoing surveillance. He was counseled that PSA levels will be drawn in the urology office, and was reassured that additional time is expected to improve bowel and bladder symptoms. He was encouraged to call back with concerns or questions regarding radiation.    Leandra Kern, LPN

## 2022-11-26 ENCOUNTER — Encounter: Payer: Self-pay | Admitting: *Deleted

## 2022-11-28 ENCOUNTER — Encounter: Payer: Self-pay | Admitting: *Deleted

## 2022-11-28 ENCOUNTER — Inpatient Hospital Stay: Payer: Medicare PPO | Attending: Radiation Oncology | Admitting: *Deleted

## 2022-11-28 DIAGNOSIS — C61 Malignant neoplasm of prostate: Secondary | ICD-10-CM

## 2022-11-28 NOTE — Progress Notes (Signed)
SCP reviewed and completed. 

## 2023-01-28 ENCOUNTER — Ambulatory Visit
Admission: RE | Admit: 2023-01-28 | Discharge: 2023-01-28 | Disposition: A | Discharge status: Home / Self Care | Payer: Medicare PPO | Source: Ambulatory Visit | Attending: Family Medicine | Admitting: Family Medicine

## 2023-01-28 ENCOUNTER — Other Ambulatory Visit: Payer: Self-pay | Admitting: Family Medicine

## 2023-01-28 DIAGNOSIS — M25552 Pain in left hip: Secondary | ICD-10-CM

## 2023-02-11 ENCOUNTER — Encounter: Payer: Self-pay | Admitting: Orthopaedic Surgery

## 2023-02-11 ENCOUNTER — Ambulatory Visit: Payer: Medicare PPO | Admitting: Orthopaedic Surgery

## 2023-02-11 DIAGNOSIS — M1612 Unilateral primary osteoarthritis, left hip: Secondary | ICD-10-CM

## 2023-02-11 MED ORDER — TRAMADOL HCL 50 MG PO TABS: 50.0000 mg | ORAL_TABLET | Freq: Every day | ORAL | 0 refills | Status: DC | PRN

## 2023-02-11 NOTE — Progress Notes (Signed)
Office Visit Note   Patient: James Ramsey           Date of Birth: 05-Mar-1941           MRN: [id number] Visit Date: 02/11/2023              Requested by: Shireen Quan, DO [address] [place],  [place] [address] PCP: Shireen Quan, DO   Assessment & Plan: Visit Diagnoses:  1. Primary osteoarthritis of left hip     Plan: Impression is aggravation of underlying left hip osteoarthritis.  His symptoms have improved slightly and he just recently started physical therapy and would like to give this a try before he does the cortisone injection.  I will send in some tramadol to help with the nighttime pain.  If his symptoms do not improve he will make an appointment with Dr. Shon Baton directly for hip injection.  Follow-Up Instructions: No follow-ups on file.   Orders:  No orders of the defined types were placed in this encounter.  Meds ordered this encounter  Medications   traMADol (ULTRAM) 50 MG tablet    Sig: Take 1-2 tablets (50-100 mg total) by mouth daily as needed.    Dispense:  20 tablet    Refill:  0      Procedures: No procedures performed   Clinical Data: No additional findings.   Subjective: Chief Complaint  Patient presents with   Left Hip - Pain    HPI James Ramsey is a very pleasant 82 year old gentleman who comes in for evaluation of left hip and groin pain for a month.  He states that he was standing when he twisted and felt and heard a pop which is now led to pain in the groin and deep hip region that radiates down the thigh.  Tylenol does not seem to help.  He is unable to take NSAIDs due to being on Eliquis.  Denies any radicular symptoms. Review of Systems  Constitutional: Negative.   HENT: Negative.    Eyes: Negative.   Respiratory: Negative.    Cardiovascular: Negative.   Gastrointestinal: Negative.   Endocrine: Negative.   Genitourinary: Negative.   Skin: Negative.   Allergic/Immunologic: Negative.   Neurological: Negative.    Hematological: Negative.   Psychiatric/Behavioral: Negative.    All other systems reviewed and are negative.    Objective: Vital Signs: There were no vitals taken for this visit.  Physical Exam Vitals and nursing note reviewed.  Constitutional:      Appearance: He is well-developed.  HENT:     Head: Normocephalic and atraumatic.  Eyes:     Pupils: Pupils are equal, round, and reactive to light.  Pulmonary:     Effort: Pulmonary effort is normal.  Abdominal:     Palpations: Abdomen is soft.  Musculoskeletal:        General: Normal range of motion.     Cervical back: Neck supple.  Skin:    General: Skin is warm.  Neurological:     Mental Status: He is alert and oriented to person, place, and time.  Psychiatric:        Behavior: Behavior normal.        Thought Content: Thought content normal.        Judgment: Judgment normal.    Ortho Exam Examination of the left hip shows pain with hip flexion past 45 degrees.  No pain with logroll.  Pain with FADIR. Specialty Comments:  No specialty comments available.  Imaging: No results  found.   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   Acute ischemic stroke (HCC) 09/11/2020   Past Medical History:  Diagnosis Date   Cancer (HCC)    Dysrhythmia    Paroxysmal atrial fibrillation (HCC) 10/29/2020   Stroke (cerebrum) (HCC)     Family History  Problem Relation Age of Onset   Breast cancer Mother    CAD Father    High Cholesterol Father    Valvular heart disease Father     Past Surgical History:  Procedure Laterality Date   CARPAL TUNNEL RELEASE Right 11/26/2021   Procedure: RIGHT CARPAL TUNNEL RELEASE;  Surgeon:  Marlyne Beards, MD;  Location: [facility];  Service: Orthopedics;  Laterality: Right;   GOLD SEED IMPLANT N/A 07/30/2022   Procedure: GOLD SEED IMPLANT;  Surgeon: Noel Christmas, MD;  Location: [facility];  Service: Urology;  Laterality: N/A;   NO PAST SURGERIES     PROSTATE BIOPSY N/A 04/24/2022   Procedure: BIOPSY TRANSRECTAL ULTRASONIC PROSTATE (TUBP);  Surgeon: Rene Paci, MD;  Location: [facility];  Service: Urology;  Laterality: N/A;  ONLY NEEDS 60 MIN FOR BOTH   SPACE OAR INSTILLATION N/A 07/30/2022   Procedure: SPACE OAR INSTILLATION;  Surgeon: Noel Christmas, MD;  Location: [facility];  Service: Urology;  Laterality: N/A;   TRANSURETHRAL RESECTION OF BLADDER TUMOR N/A 04/24/2022   Procedure: 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

## 2023-02-19 ENCOUNTER — Other Ambulatory Visit: Payer: Self-pay | Admitting: Nurse Practitioner

## 2023-02-19 DIAGNOSIS — I480 Paroxysmal atrial fibrillation: Secondary | ICD-10-CM

## 2023-02-19 NOTE — Telephone Encounter (Signed)
Prescription refill request for Eliquis received. Indication: PAF Last office visit: 07/11/22  Benjamine Sprague NP Scr: 0.95 on 10/14/22  KPN Age: 82 Weight: 92.5kg  Based on above findings Eliquis 5mg  twice daily is the appropriate dose.  Refill approved.

## 2023-03-07 ENCOUNTER — Encounter: Payer: Self-pay | Admitting: Orthopaedic Surgery

## 2023-03-18 ENCOUNTER — Ambulatory Visit: Payer: Medicare PPO | Admitting: Orthopedic Surgery

## 2023-03-24 ENCOUNTER — Ambulatory Visit: Payer: Medicare PPO | Admitting: Sports Medicine

## 2023-03-24 ENCOUNTER — Other Ambulatory Visit: Payer: Self-pay

## 2023-03-24 DIAGNOSIS — R29898 Other symptoms and signs involving the musculoskeletal system: Secondary | ICD-10-CM | POA: Diagnosis not present

## 2023-03-24 DIAGNOSIS — M5136 Other intervertebral disc degeneration, lumbar region: Secondary | ICD-10-CM | POA: Diagnosis not present

## 2023-03-24 DIAGNOSIS — Z8673 Personal history of transient ischemic attack (TIA), and cerebral infarction without residual deficits: Secondary | ICD-10-CM

## 2023-03-24 DIAGNOSIS — M1612 Unilateral primary osteoarthritis, left hip: Secondary | ICD-10-CM | POA: Diagnosis not present

## 2023-03-24 NOTE — Progress Notes (Deleted)
   Office & Procedure Note  Patient: James Ramsey             Date of Birth: Aug 27, 1941           MRN: [id number]             Visit Date: 03/24/2023  HPI:   Procedures: Visit Diagnoses:  1. Primary osteoarthritis of left hip    No procedures performed

## 2023-03-24 NOTE — Progress Notes (Unsigned)
James Ramsey - 82 y.o. male MRN [id number]  Date of birth: 11/30/1940  Office Visit Note: Visit Date: 03/24/2023 PCP: Shireen Quan, DO Referred by: Shireen Quan, DO  Subjective: Chief Complaint  Patient presents with   Left Hip - Pain   HPI: James Ramsey is a pleasant 82 y.o. male who presents today for chronic left hip pain with weakness.    Pertinent ROS were reviewed with the patient and found to be negative unless otherwise specified above in HPI.   Assessment & Plan: Visit Diagnoses:  1. Primary osteoarthritis of left hip     Plan: ***  Follow-up: No follow-ups on file.   Meds & Orders: No orders of the defined types were placed in this encounter.   Orders Placed This Encounter  Procedures   US Guided Needle Placement - No Linked Charges     Procedures: No procedures performed      Clinical History: No specialty comments available.  He reports that he has never smoked. He has never used smokeless tobacco. No results for input(s): "HGBA1C", "LABURIC" in the last 8760 hours.  Objective:   Vital Signs: There were no vitals taken for this visit.  Physical Exam  Gen: Well-appearing, in no acute distress; non-toxic CV: Regular Rate. Well-perfused. Warm.  Resp: Breathing unlabored on room air; no wheezing. Psych: Fluid speech in conversation; appropriate affect; normal thought process Neuro: Sensation intact throughout. No gross coordination deficits.   Ortho Exam - ***  Imaging: No results found.  Past Medical/Family/Surgical/Social History: Medications & Allergies reviewed per EMR, new medications updated. Patient Active Problem List   Diagnosis Date Noted   Malignant neoplasm of prostate (HCC) 05/22/2022   Carpal tunnel syndrome, right upper limb    Paroxysmal atrial fibrillation (HCC) 11/12/2021   Hypercholesterolemia 11/12/2021   Long term (current) use of anticoagulants 11/12/2021   Bilateral carpal tunnel syndrome  11/08/2021   Acute ischemic stroke (HCC) 09/11/2020   Past Medical History:  Diagnosis Date   Cancer (HCC)    Dysrhythmia    Paroxysmal atrial fibrillation (HCC) 10/29/2020   Stroke (cerebrum) (HCC)    Family History  Problem Relation Age of Onset   Breast cancer Mother    CAD Father    High Cholesterol Father    Valvular heart disease Father    Past Surgical History:  Procedure Laterality Date   CARPAL TUNNEL RELEASE Right 11/26/2021   Procedure: RIGHT CARPAL TUNNEL RELEASE;  Surgeon: Marlyne Beards, MD;  Location: [facility];  Service: Orthopedics;  Laterality: Right;   GOLD SEED IMPLANT N/A 07/30/2022   Procedure: GOLD SEED IMPLANT;  Surgeon: Noel Christmas, MD;  Location[facility];  Service: Urology;  Laterality: N/A;   NO PAST SURGERIES     PROSTATE BIOPSY N/A 04/24/2022   Procedure: BIOPSY TRANSRECTAL ULTRASONIC PROSTATE (TUBP);  Surgeon: Rene Paci, MD;  Location: [facility];  Service: Urology;  Laterality: N/A;  ONLY NEEDS 60 MIN FOR BOTH   SPACE OAR INSTILLATION N/A 07/30/2022   Procedure: SPACE OAR INSTILLATION;  Surgeon: Noel Christmas, MD;  Location: [facility];  Service: Urology;  Laterality: N/A;   TRANSURETHRAL RESECTION OF BLADDER TUMOR N/A 04/24/2022   Procedure: POSSIBLE TRANSURETHRAL RESECTION OF BLADDER TUMOR (TURBT);  Surgeon: Rene Paci, MD;  Location: [facility];  Service: Urology;  Laterality: N/A;   Social History   Occupational History   Not on file  Tobacco Use  Smoking status: Never   Smokeless tobacco: Never  Substance and Sexual Activity   Alcohol use: Yes    Comment: occ   Drug use: Never   Sexual activity: Not Currently

## 2023-03-25 ENCOUNTER — Encounter: Payer: Self-pay | Admitting: Sports Medicine

## 2023-03-25 DIAGNOSIS — M1612 Unilateral primary osteoarthritis, left hip: Secondary | ICD-10-CM

## 2023-03-25 MED ORDER — LIDOCAINE HCL 1 % IJ SOLN
4.0000 mL | INTRAMUSCULAR | Status: AC | PRN
  Administered 2023-03-25: 4 mL

## 2023-03-25 MED ORDER — METHYLPREDNISOLONE ACETATE 40 MG/ML IJ SUSP
40.0000 mg | INTRAMUSCULAR | Status: AC | PRN
  Administered 2023-03-25: 40 mg via INTRA_ARTICULAR

## 2023-03-31 ENCOUNTER — Encounter: Payer: Self-pay | Admitting: Sports Medicine

## 2023-04-11 ENCOUNTER — Ambulatory Visit: Payer: Medicare PPO | Admitting: Sports Medicine

## 2023-04-11 ENCOUNTER — Other Ambulatory Visit (INDEPENDENT_AMBULATORY_CARE_PROVIDER_SITE_OTHER): Payer: Medicare PPO

## 2023-04-11 ENCOUNTER — Encounter: Payer: Self-pay | Admitting: Sports Medicine

## 2023-04-11 DIAGNOSIS — M5442 Lumbago with sciatica, left side: Secondary | ICD-10-CM

## 2023-04-11 DIAGNOSIS — M4316 Spondylolisthesis, lumbar region: Secondary | ICD-10-CM

## 2023-04-11 DIAGNOSIS — G8929 Other chronic pain: Secondary | ICD-10-CM

## 2023-04-11 DIAGNOSIS — M5136 Other intervertebral disc degeneration, lumbar region: Secondary | ICD-10-CM | POA: Diagnosis not present

## 2023-04-11 DIAGNOSIS — R29898 Other symptoms and signs involving the musculoskeletal system: Secondary | ICD-10-CM | POA: Diagnosis not present

## 2023-04-11 NOTE — Progress Notes (Signed)
James Ramsey - 82 y.o. male MRN [id number]  Date of birth: 1941-03-21  Office Visit Note: Visit Date: 04/11/2023 PCP: Shireen Quan, DO Referred by: Shireen Quan, DO  Subjective: Chief Complaint  Patient presents with   Left Hip - Pain, Follow-up    Hip pain has gotten better (no longer has throbbing pain) post I-A steroid injection 2 weeks ago. But, cannot do any adduction exercises that he could do before the hike in [place] in April worse since 02/17/23.    Lower Back - Pain    Pain is mainly coming from the knee and down to the foot -- not all the time, though. He feels like the left leg will give way.   HPI: James Ramsey is a pleasant 82 y.o. male who presents today for chronic left foot pain with somewhat sudden onset of left leg weakness.  Did perform a left intra-articular hip injection under ultrasound on 03/24/2023.  He states this helped his hip pain a tremendous amount, essentially no more pain.  However he is still having weakness of the left leg. Reporting radiating pain that shoots down the lateral side of the leg and into the shin.  As a reminder - He has had issues with his low back before, history of possible sciatic related pain and/or piriformis muscle in the past, but never had this onset of leg weakness and difficulty walking.  This started near the middle of July.  His wife is with him today and she tells me that this weakness has been a rather sudden change and that he never used to walk like this, he is an extremely active individual and walking has become difficult because the leg feels weak at times and will give out on him.  He has been doing formalized physical therapy for the hips and leg strength without much improvement.   He is currently being treated for history of prostate cancer, undergoing leuprolide injections.  Did complete radiation treatment, last in February 2024.  Pertinent ROS were reviewed with the patient and found to be negative  unless otherwise specified above in HPI.   Assessment & Plan: Visit Diagnoses:  1. Chronic left-sided low back pain with left-sided sciatica   2. Left leg weakness   3. DDD (degenerative disc disease), lumbar   4. Anterolisthesis of lumbar spine    Plan: Discussed with Winn that we both are glad you had such excellent relief from the hip pain after intra-articular injection.  This does point to his hip pain coming from the arthritis of the left hip.  X-rays of his lumbar spine show significant degenerative disc disease with bridging as well as grade 2 anterior listhesis.  He had a rather sudden onset of left leg weakness and gait change that happened about 6 weeks ago, I do think this is originating from the lumbar spine as he has left-sided sciatica as well as myelopathy.  Given the weakness, I do think it is pertinent to obtain an urgent MRI of the lumbar spine, I will refer him to my spine surgeon Dr. Christell Constant which she will follow-up with results of the MRI and discussed next steps.  Given his radicular symptoms and left leg weakness, I did discuss with James Ramsey that surgical intervention could be required.  He will discuss this with Dr. Christell Constant after the MRI. Return precautions provided.   Follow-up: Return for Follow-up with Dr. Christell Constant after MRI for low back with acute left leg weakness.   Meds &  Orders: No orders of the defined types were placed in this encounter.   Orders Placed This Encounter  Procedures   XR Lumbar Spine Complete   MR Lumbar Spine w/o contrast     Procedures: No procedures performed      Clinical History: No specialty comments available.  He reports that he has never smoked. He has never used smokeless tobacco. No results for input(s): "HGBA1C", "LABURIC" in the last 8760 hours.  Objective:   Vital Signs: There were no vitals taken for this visit.  Physical Exam  Gen: Well-appearing, in no acute distress; non-toxic CV:  Well-perfused. Warm.  Resp: Breathing  unlabored on room air; no wheezing. Psych: Fluid speech in conversation; appropriate affect; normal thought process Neuro: Sensation intact throughout. No gross coordination deficits.   Ortho Exam - Left hip: No bony TTP, mild pain with FADIR but markedly improved.  No redness or swelling.  -Lumbar: No midline spinous process TTP.  There is some limitation with endrange flexion and extension.  There is notable weakness with 4/5 knee extension and 3/5 ankle dorsiflexion on the left compared to 5/5 strength on the contralateral extremity.  He has some mild weakness with hip flexion but this is likely more secondary to pain.  He has diminished DTRs with Achilles and patellar tendon bilaterally.  Continued weakness with hip abduction on the left.  Gait analysis: Short stepping gait with the left hip.  He leans towards the contralateral hip where he will have to a swing the left leg while in order to lock.  There is a degree of foot drop through ambulation phase.   Imaging: XR Lumbar Spine Complete  Result Date: 04/11/2023 4 views of the lumbar spine including AP, lateral and flexion/extension views were ordered and reviewed by myself.  X-rays show severe degenerative disc disease from the L3-L5 level.  There is stepwise anterior listhesis of the lumbar spine that is at least grade 2 of L3 on L4.  Essentially bone-on-bone arthritic change between L3-L4 and L4-L5 with notable bony sclerosis, large anterior osteophytes and bridging from L3-L4.  Likely foraminal narrowing at this level as well bilaterally.   Narrative & Impression  CLINICAL DATA:  Left hip pain for 2 weeks after twisting injury and feeling a pop   EXAM: DG HIP (WITH OR WITHOUT PELVIS) 2-3V LEFT   COMPARISON:  PET-CT 05/26/2022   FINDINGS: Advanced degenerative changes of the lower lumbar spine partially visualized. Mild joint space loss and spurring of the hips. No fracture or dislocation. Radiopaque markers noted in the region  of the prostate.   IMPRESSION: Mild bilateral hip osteoarthrosis.     Electronically Signed   By: Acquanetta Belling M.D.   On: 02/02/2023 09:33    Past Medical/Family/Surgical/Social History: Medications & Allergies reviewed per EMR, new medications updated. Patient Active Problem List   Diagnosis Date Noted   Malignant neoplasm of prostate (HCC) 05/22/2022   Carpal tunnel syndrome, right upper limb    Paroxysmal atrial fibrillation (HCC) 11/12/2021   Hypercholesterolemia 11/12/2021   Long term (current) use of anticoagulants 11/12/2021   Bilateral carpal tunnel syndrome 11/08/2021   Acute ischemic stroke (HCC) 09/11/2020   Past Medical History:  Diagnosis Date   Cancer (HCC)    Dysrhythmia    Paroxysmal atrial fibrillation (HCC) 10/29/2020   Stroke (cerebrum) (HCC)    Family History  Problem Relation Age of Onset   Breast cancer Mother    CAD Father    High Cholesterol Father  Valvular heart disease Father    Past Surgical History:  Procedure Laterality Date   CARPAL TUNNEL RELEASE Right 11/26/2021   Procedure: RIGHT CARPAL TUNNEL RELEASE;  Surgeon: Marlyne Beards, MD;  Location: [facility];  Service: Orthopedics;  Laterality: Right;   GOLD SEED IMPLANT N/A 07/30/2022   Procedure: GOLD SEED IMPLANT;  Surgeon: Noel Christmas, MD;  Location: [facility];  Service: Urology;  Laterality: N/A;   NO PAST SURGERIES     PROSTATE BIOPSY N/A 04/24/2022   Procedure: BIOPSY TRANSRECTAL ULTRASONIC PROSTATE (TUBP);  Surgeon: Rene Paci, MD;  Location: [facility];  Service: Urology;  Laterality: N/A;  ONLY NEEDS 60 MIN FOR BOTH   SPACE OAR INSTILLATION N/A 07/30/2022   Procedure: SPACE OAR INSTILLATION;  Surgeon: Noel Christmas, MD;  Location: [facility];  Service: Urology;  Laterality: N/A;   TRANSURETHRAL RESECTION OF BLADDER TUMOR N/A 04/24/2022   Procedure: POSSIBLE TRANSURETHRAL RESECTION OF  BLADDER TUMOR (TURBT);  Surgeon: Rene Paci, MD;  Location: [facility];  Service: Urology;  Laterality: N/A;   Social History   Occupational History   Not on file  Tobacco Use   Smoking status: Never   Smokeless tobacco: Never  Substance and Sexual Activity   Alcohol use: Yes    Comment: occ   Drug use: Never   Sexual activity: Not Currently

## 2023-04-17 ENCOUNTER — Encounter: Payer: Self-pay | Admitting: Sports Medicine

## 2023-04-19 ENCOUNTER — Ambulatory Visit
Admission: RE | Admit: 2023-04-19 | Discharge: 2023-04-19 | Disposition: A | Discharge status: Home / Self Care | Payer: Medicare PPO | Source: Ambulatory Visit | Attending: Sports Medicine | Admitting: Sports Medicine

## 2023-04-19 DIAGNOSIS — M4316 Spondylolisthesis, lumbar region: Secondary | ICD-10-CM

## 2023-04-19 DIAGNOSIS — R29898 Other symptoms and signs involving the musculoskeletal system: Secondary | ICD-10-CM

## 2023-04-19 DIAGNOSIS — M47816 Spondylosis without myelopathy or radiculopathy, lumbar region: Secondary | ICD-10-CM | POA: Diagnosis not present

## 2023-04-19 DIAGNOSIS — M48061 Spinal stenosis, lumbar region without neurogenic claudication: Secondary | ICD-10-CM | POA: Diagnosis not present

## 2023-04-19 DIAGNOSIS — G8929 Other chronic pain: Secondary | ICD-10-CM

## 2023-04-19 DIAGNOSIS — M5136 Other intervertebral disc degeneration, lumbar region: Secondary | ICD-10-CM

## 2023-04-28 DIAGNOSIS — H25813 Combined forms of age-related cataract, bilateral: Secondary | ICD-10-CM | POA: Diagnosis not present

## 2023-04-28 DIAGNOSIS — H353132 Nonexudative age-related macular degeneration, bilateral, intermediate dry stage: Secondary | ICD-10-CM | POA: Diagnosis not present

## 2023-04-28 DIAGNOSIS — I69898 Other sequelae of other cerebrovascular disease: Secondary | ICD-10-CM | POA: Diagnosis not present

## 2023-04-28 DIAGNOSIS — H40013 Open angle with borderline findings, low risk, bilateral: Secondary | ICD-10-CM | POA: Diagnosis not present

## 2023-04-30 ENCOUNTER — Encounter: Payer: Self-pay | Admitting: Orthopedic Surgery

## 2023-04-30 DIAGNOSIS — M810 Age-related osteoporosis without current pathological fracture: Secondary | ICD-10-CM

## 2023-05-06 ENCOUNTER — Ambulatory Visit (HOSPITAL_BASED_OUTPATIENT_CLINIC_OR_DEPARTMENT_OTHER)
Admission: RE | Admit: 2023-05-06 | Discharge: 2023-05-06 | Disposition: A | Discharge status: Home / Self Care | Payer: Medicare PPO | Source: Ambulatory Visit | Attending: Orthopedic Surgery | Admitting: Orthopedic Surgery

## 2023-05-06 DIAGNOSIS — M810 Age-related osteoporosis without current pathological fracture: Secondary | ICD-10-CM | POA: Diagnosis not present

## 2023-05-07 ENCOUNTER — Encounter: Payer: Self-pay | Admitting: Sports Medicine

## 2023-05-15 ENCOUNTER — Encounter: Payer: Self-pay | Admitting: Orthopedic Surgery

## 2023-05-15 ENCOUNTER — Ambulatory Visit: Payer: Medicare PPO | Admitting: Orthopedic Surgery

## 2023-05-15 ENCOUNTER — Telehealth: Payer: Self-pay | Admitting: *Deleted

## 2023-05-15 VITALS — BP 145/76 | HR 72 | Ht 71.5 in | Wt 195.0 lb

## 2023-05-15 DIAGNOSIS — M6281 Muscle weakness (generalized): Secondary | ICD-10-CM | POA: Diagnosis not present

## 2023-05-15 NOTE — Telephone Encounter (Signed)
   Pre-operative Risk Assessment    Patient Name: James Ramsey  DOB: [birth year] MRN: [id number]      Request for Surgical Clearance    Procedure:   L2-4 LAMINECTOMY & POSTERIIOR INSTRUMENTED SPINAL FUSION  Date of Surgery:  Clearance TBD                                 Surgeon:  DR. Willia Craze Surgeon's Group or Practice Name:  [facility] Phone number:  [phone] Fax number:  [phone]   Type of Clearance Requested:   - Medical  - Pharmacy:  Hold Apixaban (Eliquis) X'S 2 DAYS    Type of Anesthesia:  General    Additional requests/questions:    Wilhemina Cash   05/15/2023, 3:28 PM

## 2023-05-15 NOTE — Progress Notes (Signed)
Orthopedic Spine Surgery Office Note  Assessment: Patient is a 82 y.o. male with lumbar stenosis and neurologic weakness   Plan: -Patient has tried PT, Tylenol, left hip intra-articular injection, Medrol Dosepak  -Patient has neurologic weakness that is affecting his walking but has not improved over the last 3 months, so discussed operative management in the form of laminectomy and fusion.  After our discussion, patient elected to proceed with surgery -Patient will next be seen a date of surgery   The patient has symptoms consistent with lumbar stenosis. The patient's symptoms were not getting improvement with conservative treatment so operative management was discussed in the form of L2, L3, L4 segment laminectomies and foraminotomies on the left with posterior instrumented spinal fusion. The risks including but not limited to dural tear, nerve root injury, paralysis, persistent pain, pseudarthrosis, infection, bleeding, hardware failure, adjacent segment disease, heart attack, death, stroke, fracture, and need for additional procedures were discussed with the patient. The benefit of the surgery would be improvement in the radiating leg pain.  I explained that surgery can help with recovery of strength due to neurologic weakness but it takes weeks to months.  I also told him that surgery can help with the decrease sensation over the left leg but that also takes months if it does improve. I explained that back pain relief is not the goal of the surgery and it is not reliably alleviated with this surgery. The alternatives to surgical management were covered with the patient and included continued monitoring, physical therapy, over-the-counter pain medications, ambulatory aids, and activity modification. All the patient's questions were answered to his satisfaction. After this discussion, the patient expressed understanding and elected to proceed with surgical intervention.      ___________________________________________________________________________   History:  Patient is a 82 y.o. male who presents today for lumbar spine.  Patient has had pain in his left leg from the knee to the foot over the anterolateral since June 2024.  He also has noticed numbness in that same area.  He completed a Medrol Dosepak recently and said that has helped with the pain but he still has numbness.  He has also had difficulty walking as his legs feel heavy.  He has also noted weakness with hip abduction and dorsiflexion of the ankle on the left side that have decreased his ability to walk.  He has not noticed any weakness in the right lower extremity.  He has had to walk with a high steppage gait decreased dorsiflexion strength. There was no trauma or injury that preceded the onset of his symptoms.     Weakness: Yes, left leg feels weaker with hip abduction and dorsiflexion at the ankle Symptoms of imbalance: Yes, often feels off balance as a result of the left leg weakness Paresthesias and numbness: Yes, has decreased sensation over the anterior and anterolateral left leg.  No other numbness or paresthesias Bowel or bladder incontinence: Denies Saddle anesthesia: Denies  Treatments tried: PT, Tylenol, left hip intra-articular injection, Medrol Dosepak  Review of systems: Denies fevers and chills, night sweats, unexplained weight loss, pain that wakes him at night.  Has a history of prostate cancer  Past medical history: Atrial fibrillation Prostate cancer Hypertension History of stroke  Allergies: NKDA  Past surgical history:  Right carpal tunnel release Transurethral resection of bladder tumor Prostate biopsy Gold seed implantation  Social history: Denies use of nicotine product (smoking, vaping, patches, smokeless) Alcohol use: Yes, approximately 2 drinks per week Denies recreational drug use  Physical Exam:  BMI of 26.8  General: no acute distress, appears  stated age Neurologic: alert, answering questions appropriately, following commands Respiratory: unlabored breathing on room air, symmetric chest rise Psychiatric: appropriate affect, normal cadence to speech   MSK (spine):  -Strength exam      Left  Right EHL    3/5  5/5 TA    4/5  5/5 GSC    5/5  5/5 Knee extension  5/5  5/5 Hip flexion   5/5  5/5  Hip abduction strength of 2 out of 5 on the left, 4-5 on the right  -Sensory exam    Sensation intact to light touch in L3-S1 nerve distributions of bilateral lower extremities (decreased over the anterior and anterolateral aspect of the left leg)  -Achilles DTR: 1/4 on the left, 1/4 on the right -Patellar tendon DTR: 1/4 on the left, 1/4 on the right  -Straight leg raise: Negative bilaterally -Clonus: no beats bilaterally  -Left hip exam: No pain through range of motion, negative Stinchfield, negative FABER -Right hip exam: No pain through range of motion, negative Stinchfield, negative FABER  Imaging: XRs of the lumbar spine from 04/11/2023 were independently reviewed and interpreted, showing disc height loss at L3/4, L4/5, L5/S1.  Anterior osteophyte formation seen at those levels as well.  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  No fracture or dislocation seen.  No coronal curvature seen.  MRI of the lumbar spine from 04/19/2023 DDD at L2/3, L3/4 was independently reviewed and interpreted, showing, L4/5, L5/S1.  Spondylolisthesis seen at L3/4.  Central, lateral recess, and bilateral foraminal stenosis at L2/3 and L3/4.  Bilateral foraminal stenosis seen at L4/5 and L5/S1.   Patient name: James Ramsey Patient MRN: [id number] Date of visit: 05/15/23

## 2023-05-16 ENCOUNTER — Encounter: Payer: Self-pay | Admitting: Orthopedic Surgery

## 2023-05-16 NOTE — Telephone Encounter (Signed)
Pt scheduled to see [name], PA-C 05/22/23, clearance will be addressed at that time.  Will route to the requesting surgeon's office to make them aware.

## 2023-05-16 NOTE — Telephone Encounter (Signed)
Name: James Ramsey  DOB: [birth year]  MRN: [id number]  Primary Cardiologist: None  Chart reviewed as part of pre-operative protocol coverage. Because of [name]'s past medical history and time since last visit, he will require a follow-up in-office visit in order to better assess preoperative cardiovascular risk.  Pre-op covering staff: - Please schedule appointment and call patient to inform them. If patient already had an upcoming appointment within acceptable timeframe, please add "pre-op clearance" to the appointment notes so provider is aware. - Please contact requesting surgeon's office via preferred method (i.e, phone, fax) to inform them of need for appointment prior to surgery.  This message will also be routed to pharmacy pool for input on holding Eliquis as requested below so that this information is available to the clearing provider at time of patient's appointment.   Perlie Gold, PA-C  05/16/2023, 7:59 AM

## 2023-05-16 NOTE — Telephone Encounter (Signed)
Patient with diagnosis of afib on Eliquis for anticoagulation.    Procedure: L2-4 LAMINECTOMY & POSTERIIOR INSTRUMENTED SPINAL FUSION  Date of procedure: TBD   CHA2DS2-VASc Score = 4   This indicates a 4.8% annual risk of stroke. The patient's score is based upon: CHF History: 0 HTN History: 0 Diabetes History: 0 Stroke History: 2 Vascular Disease History: 0 Age Score: 2 Gender Score: 0      CrCl 67 ml/min  Patient previously approved to hold 3 days.   Per office protocol, patient can hold Eliquis for 3 days prior to procedure.    **This guidance is not considered finalized until pre-operative APP has relayed final recommendations.**

## 2023-05-22 ENCOUNTER — Encounter: Payer: Self-pay | Admitting: Physician Assistant

## 2023-05-22 ENCOUNTER — Ambulatory Visit: Payer: Medicare PPO | Attending: Physician Assistant | Admitting: Physician Assistant

## 2023-05-22 VITALS — BP 128/64 | HR 71 | Ht 71.0 in | Wt 196.0 lb

## 2023-05-22 DIAGNOSIS — Z8673 Personal history of transient ischemic attack (TIA), and cerebral infarction without residual deficits: Secondary | ICD-10-CM

## 2023-05-22 DIAGNOSIS — I639 Cerebral infarction, unspecified: Secondary | ICD-10-CM

## 2023-05-22 DIAGNOSIS — Z01818 Encounter for other preprocedural examination: Secondary | ICD-10-CM | POA: Diagnosis not present

## 2023-05-22 DIAGNOSIS — I480 Paroxysmal atrial fibrillation: Secondary | ICD-10-CM

## 2023-05-22 DIAGNOSIS — R9431 Abnormal electrocardiogram [ECG] [EKG]: Secondary | ICD-10-CM

## 2023-05-22 NOTE — Progress Notes (Signed)
Cardiology Office Note:  .   Date:  05/23/2023  ID:  [name], DOB 1941-07-30, MRN [id number] PCP: Shireen Quan, DO  [facility] Providers Cardiologist:  None Electrophysiologist:  Nobie Putnam, MD     History of Present Illness: James Ramsey is a 82 y.o. male with past medical history of PAF and CVA.  Her A-fib was diagnosed in February 2022 via monitoring done post stroke.  I previously seen the patient virtually for preop clearance for right carpal tunnel release surgery in March 2023.  She previously followed by [name].  He has been maintained on Eliquis.  He is not on rate control medication.  During the last visit, he was seen by [name] of EP service in September 2023 at which time he was very active and was doing well.  Patient requires upcoming L2-4 laminectomy and spinal fusion surgery.  Patient presents today for preop clearance.  He denies any chest pain or shortness of breath.  Despite his back issue, he continued to ambulate.  He has no problem walking 2 blocks away from his home and back without any exertional symptoms.  For some reason, she is EKG shows inferolateral ST depression which is new compared to the previous EKG from last year.  I discussed his case with DOD Dr. [name] who recommended Lexiscan Myoview to further assess prior to cardiac clearance.  If Myoview is normal, he may proceed with surgery.  He will need to hold Eliquis for 3 days prior to the surgery and restart as soon as possible afterward at the surgeon's discretion.  ROS:   He denies chest pain, palpitations, dyspnea, pnd, orthopnea, n, v, dizziness, syncope, edema, weight gain, or early satiety. All other systems reviewed and are otherwise negative except as noted above.    Studies Reviewed: Marland Kitchen   EKG Interpretation Date/Time:  Thursday May 22 2023 08:49:35 EDT Ventricular Rate:  71 PR Interval:  222 QRS Duration:  78 QT Interval:  382 QTC  Calculation: 415 R Axis:   87  Text Interpretation: Sinus rhythm with 1st degree A-V block Possible Left atrial enlargement ST & T wave abnormality, consider inferolateral ischemia Confirmed by [name] [phone]) on 05/23/2023 3:00:04 PM     Risk Assessment/Calculations:    CHA2DS2-VASc Score = 4   This indicates a 4.8% annual risk of stroke. The patient's score is based upon: CHF History: 0 HTN History: 0 Diabetes History: 0 Stroke History: 2 Vascular Disease History: 0 Age Score: 2 Gender Score: 0            Physical Exam:   VS:  BP 128/64 (BP Location: Left Arm, Patient Position: Sitting, Cuff Size: Small)   Pulse 71   Ht 5\' 11"  (1.803 m)   Wt 196 lb (88.9 kg)   BMI 27.34 kg/m    Wt Readings from Last 3 Encounters:  05/22/23 196 lb (88.9 kg)  05/15/23 195 lb (88.5 kg)  07/30/22 199 lb 11.2 oz (90.6 kg)    GEN: Well nourished, well developed in no acute distress NECK: No JVD; No carotid bruits CARDIAC: RRR, no murmurs, rubs, gallops RESPIRATORY:  Clear to auscultation without rales, wheezing or rhonchi  ABDOMEN: Soft, non-tender, non-distended EXTREMITIES:  No edema; No deformity   ASSESSMENT AND PLAN: .    Preoperative clearance: He has no anginal symptom.  Patient requires upcoming back surgery.  EKG today showed inferolateral ST depression that is new.  I discussed his case  with DOD Dr. Tresa Endo who recommended proceeding with Myoview for preoperative clearance.  He is aware that he will need to hold Eliquis for 3 days prior to the surgery.  PAF: On Eliquis, maintaining sinus rhythm  History of CVA: No recurrence  Abnormal EKG: See #1.  Plan to proceed with Myoview    Informed Consent   Shared Decision Making/Informed Consent The risks [chest pain, shortness of breath, cardiac arrhythmias, dizziness, blood pressure fluctuations, myocardial infarction, stroke/transient ischemic attack, nausea, vomiting, allergic reaction, radiation exposure, metallic taste  sensation and life-threatening complications (estimated to be 1 in 10,000)], benefits (risk stratification, diagnosing coronary artery disease, treatment guidance) and alternatives of a nuclear stress test were discussed in detail with Mr. Trottier and he agrees to proceed.     Dispo: Established with the EP service  Signed, [name], PA

## 2023-05-22 NOTE — Patient Instructions (Addendum)
Medication Instructions:  NO CHANGES *If you need a refill on your cardiac medications before your next appointment, please call your pharmacy*   Lab Work: NO LABS If you have labs (blood work) drawn today and your tests are completely normal, you will receive your results only by: MyChart Message (if you have MyChart) OR A paper copy in the mail If you have any lab test that is abnormal or we need to change your treatment, we will call you to review the results.   Testing/Procedures:[address] Your physician has requested that you have a lexiscan myoview. Please follow instruction as given below.    Follow-Up: At [facility], you and your health needs are our priority.  As part of our continuing mission to provide you with exceptional heart care, we have created designated Provider Care Teams.  These Care Teams include your primary Cardiologist (physician) and Advanced Practice Providers (APPs -  Physician Assistants and Nurse Practitioners) who all work together to provide you with the care you need, when you need it.   Your next appointment:   6 month(s)  Provider:   EP DOCTOR  Other Instructions [name], PA  has ordered a Lexiscan Myocardial Perfusion Imaging Study.  Please arrive 15 minutes prior to your appointment time for registration and insurance purposes.   The test will take approximately 3 to 4 hours to complete; you may bring reading material.  If someone comes with you to your appointment, they will need to remain in the main lobby due to limited space in the testing area. **If you are pregnant or breastfeeding, please notify the nuclear lab prior to your appointment**   How to prepare for your Myocardial Perfusion Test: Do not eat or drink 3 hours prior to your test, except you may have water. Do not consume products containing caffeine (regular or decaffeinated) 12 hours prior to your test. (ex: coffee, chocolate, sodas, tea). Do wear  comfortable clothes (no dresses or overalls) and walking shoes, tennis shoes preferred (No heels or open toe shoes are allowed). Do NOT wear cologne, perfume, aftershave, or lotions (deodorant is allowed). If you use an inhaler, use it the AM of your test and bring it with you.  If you use a nebulizer, use it the AM of your test.  If these instructions are not followed, your test will have to be rescheduled.

## 2023-05-26 ENCOUNTER — Telehealth: Payer: Self-pay | Admitting: Physician Assistant

## 2023-05-26 ENCOUNTER — Encounter: Payer: Self-pay | Admitting: Sports Medicine

## 2023-05-26 NOTE — Telephone Encounter (Signed)
Left vm for pt to reschedule 05-27-23 appt James Ramsey) due to prior-authorization not complete.  JB, 05-26-23

## 2023-05-27 ENCOUNTER — Encounter (HOSPITAL_COMMUNITY): Payer: Self-pay

## 2023-05-27 ENCOUNTER — Ambulatory Visit: Payer: Medicare PPO | Admitting: Physician Assistant

## 2023-05-27 ENCOUNTER — Ambulatory Visit (HOSPITAL_COMMUNITY): Payer: Medicare PPO

## 2023-05-29 ENCOUNTER — Encounter: Payer: Self-pay | Admitting: Orthopedic Surgery

## 2023-05-29 ENCOUNTER — Telehealth (HOSPITAL_COMMUNITY): Payer: Self-pay

## 2023-05-29 NOTE — Telephone Encounter (Signed)
Detailed instructions left on the patient's answering machine. Asked to call back with any questions. [name] CCT

## 2023-06-02 ENCOUNTER — Encounter: Payer: Self-pay | Admitting: Orthopedic Surgery

## 2023-06-03 ENCOUNTER — Ambulatory Visit (HOSPITAL_COMMUNITY): Payer: Medicare PPO

## 2023-06-03 DIAGNOSIS — R9431 Abnormal electrocardiogram [ECG] [EKG]: Secondary | ICD-10-CM

## 2023-06-05 DIAGNOSIS — C61 Malignant neoplasm of prostate: Secondary | ICD-10-CM | POA: Diagnosis not present

## 2023-06-06 NOTE — Telephone Encounter (Signed)
Looks like the stress test was delayed due to prior authorization from [employer]. We can certainly do a nursing visit with repeat EKG to see if the abnormality seen on previous EKG has resolved.

## 2023-06-06 NOTE — Telephone Encounter (Signed)
Called pt- Nurse Visit scheduled for 06/09/23 at 10:00 for an EKG. Pt verbalized understanding and agrees with plan of care.

## 2023-06-09 ENCOUNTER — Encounter (HOSPITAL_COMMUNITY): Payer: Self-pay

## 2023-06-09 ENCOUNTER — Ambulatory Visit: Payer: Medicare PPO | Attending: Cardiology

## 2023-06-09 ENCOUNTER — Encounter (HOSPITAL_COMMUNITY): Payer: Medicare PPO

## 2023-06-09 VITALS — BP 126/77 | HR 72

## 2023-06-09 DIAGNOSIS — Z01818 Encounter for other preprocedural examination: Secondary | ICD-10-CM | POA: Diagnosis not present

## 2023-06-09 NOTE — Patient Instructions (Addendum)
Nurse Visit   Date of Encounter: 06/09/2023 ID: James Ramsey, DOB 1941/01/14, MRN [id number]  PCP:  Shireen Quan, DO   [facility] Providers Cardiologist:  [name]  Electrophysiologist:  Nobie Putnam, MD     Visit Details   VS: BP: 126/77, O2: 97%, Pulse; 72  Wt Readings from Last 3 Encounters:  05/22/23 196 lb (88.9 kg)  05/15/23 195 lb (88.5 kg)  07/30/22 199 lb 11.2 oz (90.6 kg)     Reason for visit: EKG Only Performed today: Vitals, EKG, Provider consulted:Hao Meng, and Education Changes (medications, testing, etc.) : No Changes  Length of Visit: 30 minutes    Medications Adjustments/Labs and Tests Ordered: Orders Placed This Encounter  Procedures   EKG 12-Lead      Signed, Jonah Blue, RN  06/09/2023 11:01 AM

## 2023-06-09 NOTE — Progress Notes (Signed)
Nurse Visit   Date of Encounter: 06/09/2023 ID: James Ramsey, DOB 27-Aug-1941, MRN [id number]  PCP:  Shireen Quan, DO    [facility] Providers Cardiologist:  [name]  Electrophysiologist:  Nobie Putnam, MD { Click to update primary MD,subspecialty MD or APP then REFRESH:1}     Visit Details   VS: BP: 126/77, O2: 97%, Pulse; 72  Wt Readings from Last 3 Encounters:  05/22/23 196 lb (88.9 kg)  05/15/23 195 lb (88.5 kg)  07/30/22 199 lb 11.2 oz (90.6 kg)     Reason for visit: EKG Only Performed today: Vitals, EKG, Provider consulted:Hao Meng, and Education Changes (medications, testing, etc.) : No Changes  Length of Visit: 30 minutes    Medications Adjustments/Labs and Tests Ordered: Orders Placed This Encounter  Procedures   EKG 12-Lead      Signed, Jonah Blue, RN  06/09/2023 11:01 AM

## 2023-06-09 NOTE — Progress Notes (Signed)
   Nurse Visit   Date of Encounter: 06/09/2023 ID: Nahome Hillier, DOB 26-Jan-1941, MRN [id number]  PCP:  Shireen Quan, DO   [facility] Providers Cardiologist:  [name] Electrophysiologist:  Nobie Putnam, MD      Visit Details   VS: BP: 126/77, O2: 97%, Pulse; 72  Wt Readings from Last 3 Encounters:  05/22/23 196 lb (88.9 kg)  05/15/23 195 lb (88.5 kg)  07/30/22 199 lb 11.2 oz (90.6 kg)     Reason for visit: EKG Only Performed today: Vitals, EKG, Provider consulted:Hao Meng, and Education Changes (medications, testing, etc.) : No Changes  Length of Visit: 30 minutes    Medications Adjustments/Labs and Tests Ordered: Orders Placed This Encounter  Procedures   EKG 12-Lead      Signed, Jonah Blue, RN  06/09/2023 10:53 AM

## 2023-06-10 NOTE — Telephone Encounter (Signed)
Name: James Ramsey  DOB: Nov 21, 1940  MRN: [id number]   Primary Cardiologist: None  Chart reviewed as part of pre-operative protocol coverage. Patient was contacted 06/10/2023 in reference to pre-operative risk assessment for pending surgery as outlined below.  James Ramsey was last seen on 05/22/2023 by [name] PA-C.  Since that day, James Ramsey has done well without any exertional chest pain or shortness of breath.  He is able to exercise for 60 to 70 minutes on a daily basis without exertional symptoms.  Initially we recommended a stress test due to minor ST depression in the inferolateral leads that was seen on the previous EKG.  The stress test was delayed due to insurance issue.  We ended up repeating EKG on 06/09/2023 which was personally reviewed by myself and Dr. Tresa Endo.  The ST depression in the inferior lead has completely resolved.  There is still minimal ST depression in the lateral leads.  Patient is clearly able to achieve more than 4 metabolic level of activity without any symptoms.  I discussed the case with Dr. [name] again.  Given lack of symptoms and the great exercise ability, we felt the patient is at acceptable risk to proceed with upcoming surgery.  We initially felt the patient would be a low risk candidate proceeding with a moderate risk procedure if the stress test is normal, however given minimal EKG changes, he would have slightly higher risk than we initially anticipated.  He will be a moderate risk patient going through a moderate risk procedure.  The risk is not prohibitive.  He is at acceptable risk to proceed.  He has been instructed to hold Eliquis for 3 days prior to the procedure and restart as soon as possible afterward at the surgeon's discretion.  The patient was advised that if he develops new symptoms prior to surgery to contact our office to arrange for a follow-up visit, and he verbalized understanding.  I will route this  recommendation to the requesting party via Epic fax function and remove from pre-op pool. Please call with questions.  [place], [place] 06/10/2023, 8:28 AM

## 2023-06-10 NOTE — Telephone Encounter (Signed)
I have reviewed the EKG from nursing visit and spoke with the patient, he has been cleared for surgery. Preop clearance sent to the surgeon's office

## 2023-06-10 NOTE — Telephone Encounter (Signed)
I spoke with patient today. Clearance has been received, and patient is scheduled for surgery on 07/08/23.

## 2023-06-12 DIAGNOSIS — C61 Malignant neoplasm of prostate: Secondary | ICD-10-CM | POA: Diagnosis not present

## 2023-06-12 DIAGNOSIS — Z8551 Personal history of malignant neoplasm of bladder: Secondary | ICD-10-CM | POA: Diagnosis not present

## 2023-06-27 NOTE — Pre-Procedure Instructions (Signed)
Surgical Instructions   Your procedure is scheduled on Tuesday, November 5th. Report to [facility] Main Entrance "A" at 05:30 A.M., then check in with the Admitting office. Any questions or running late day of surgery: call [phone]  Questions prior to your surgery date: call [phone], Monday-Friday, 8am-4pm. If you experience any cold or flu symptoms such as cough, fever, chills, shortness of breath, etc. between now and your scheduled surgery, please notify us at the above number.     Remember:  Do not eat after midnight the night before your surgery  You may drink clear liquids until 04:30 AM the morning of your surgery.   Clear liquids allowed are: Water, Non-Citrus Juices (without pulp), Carbonated Beverages, Clear Tea, Black Coffee Only (NO MILK, CREAM OR POWDERED CREAMER of any kind), and Gatorade.  Patient Instructions  The night before surgery:  No food after midnight. ONLY clear liquids after midnight  The day of surgery (if you do NOT have diabetes):  Drink ONE (1) Pre-Surgery Clear Ensure by 04:30 AM the morning of surgery. Drink in one sitting. Do not sip.  This drink was given to you during your hospital  pre-op appointment visit.  Nothing else to drink after completing the  Pre-Surgery Clear Ensure.          If you have questions, please contact your surgeon's office.    Take these medicines the morning of surgery with A SIP OF WATER  amLODipine (NORVASC)    May take these medicines IF NEEDED: acetaminophen (TYLENOL)   Hold Eliquis for 3 days prior to surgery. Last dose 11/1.   One week prior to surgery, STOP taking any Aspirin (unless otherwise instructed by your surgeon) Aleve, Naproxen, Ibuprofen, Motrin, Advil, Goody's, BC's, all herbal medications, fish oil, and non-prescription vitamins.                     Do NOT Smoke (Tobacco/Vaping) for 24 hours prior to your procedure.  If you use a CPAP at night, you may bring your mask/headgear for  your overnight stay.   You will be asked to remove any contacts, glasses, piercing's, hearing aid's, dentures/partials prior to surgery. Please bring cases for these items if needed.    Patients discharged the day of surgery will not be allowed to drive home, and someone needs to stay with them for 24 hours.  SURGICAL WAITING ROOM VISITATION Patients may have no more than 2 support people in the waiting area - these visitors may rotate.   Pre-op nurse will coordinate an appropriate time for 1 ADULT support person, who may not rotate, to accompany patient in pre-op.  Children under the age of 29 must have an adult with them who is not the patient and must remain in the main waiting area with an adult.  If the patient needs to stay at the hospital during part of their recovery, the visitor guidelines for inpatient rooms apply.  Please refer to the [facility] website for the visitor guidelines for any additional information.   If you received a COVID test during your pre-op visit  it is requested that you wear a mask when out in public, stay away from anyone that may not be feeling well and notify your surgeon if you develop symptoms. If you have been in contact with anyone that has tested positive in the last 10 days please notify you surgeon.      Pre-operative 5 CHG Bathing Instructions   You can play  a key role in reducing the risk of infection after surgery. Your skin needs to be as free of germs as possible. You can reduce the number of germs on your skin by washing with CHG (chlorhexidine gluconate) soap before surgery. CHG is an antiseptic soap that kills germs and continues to kill germs even after washing.   DO NOT use if you have an allergy to chlorhexidine/CHG or antibacterial soaps. If your skin becomes reddened or irritated, stop using the CHG and notify one of our RNs at [phone].   Please shower with the CHG soap starting 4 days before surgery using the following  schedule:     Please keep in mind the following:  DO NOT shave, including legs and underarms, starting the day of your first shower.   You may shave your face at any point before/day of surgery.  Place clean sheets on your bed the day you start using CHG soap. Use a clean washcloth (not used since being washed) for each shower. DO NOT sleep with pets once you start using the CHG.   CHG Shower Instructions:  Wash your face and private area with normal soap. If you choose to wash your hair, wash first with your normal shampoo.  After you use shampoo/soap, rinse your hair and body thoroughly to remove shampoo/soap residue.  Turn the water OFF and apply about 3 tablespoons (45 ml) of CHG soap to a CLEAN washcloth.  Apply CHG soap ONLY FROM YOUR NECK DOWN TO YOUR TOES (washing for 3-5 minutes)  DO NOT use CHG soap on face, private areas, open wounds, or sores.  Pay special attention to the area where your surgery is being performed.  If you are having back surgery, having someone wash your back for you may be helpful. Wait 2 minutes after CHG soap is applied, then you may rinse off the CHG soap.  Pat dry with a clean towel  Put on clean clothes/pajamas   If you choose to wear lotion, please use ONLY the CHG-compatible lotions on the back of this paper.   Additional instructions for the day of surgery: DO NOT APPLY any lotions, deodorants, cologne, or perfumes.   Do not bring valuables to the hospital. [facility] is not responsible for any belongings/valuables. Do not wear nail polish, gel polish, artificial nails, or any other type of covering on natural nails (fingers and toes) Do not wear jewelry or makeup Put on clean/comfortable clothes.  Please brush your teeth.  Ask your nurse before applying any prescription medications to the skin.     CHG Compatible Lotions   Aveeno Moisturizing lotion  Cetaphil Moisturizing Cream  Cetaphil Moisturizing Lotion  Clairol Herbal Essence  Moisturizing Lotion, Dry Skin  Clairol Herbal Essence Moisturizing Lotion, Extra Dry Skin  Clairol Herbal Essence Moisturizing Lotion, Normal Skin  Curel Age Defying Therapeutic Moisturizing Lotion with Alpha Hydroxy  Curel Extreme Care Body Lotion  Curel Soothing Hands Moisturizing Hand Lotion  Curel Therapeutic Moisturizing Cream, Fragrance-Free  Curel Therapeutic Moisturizing Lotion, Fragrance-Free  Curel Therapeutic Moisturizing Lotion, Original Formula  Eucerin Daily Replenishing Lotion  Eucerin Dry Skin Therapy Plus Alpha Hydroxy Crme  Eucerin Dry Skin Therapy Plus Alpha Hydroxy Lotion  Eucerin Original Crme  Eucerin Original Lotion  Eucerin Plus Crme Eucerin Plus Lotion  Eucerin TriLipid Replenishing Lotion  Keri Anti-Bacterial Hand Lotion  Keri Deep Conditioning Original Lotion Dry Skin Formula Softly Scented  Keri Deep Conditioning Original Lotion, Fragrance Free Sensitive Skin Formula  Keri Lotion Fast Absorbing  Fragrance Free Sensitive Skin Formula  Keri Lotion Fast Absorbing Softly Scented Dry Skin Formula  Keri Original Lotion  Keri Skin Renewal Lotion Keri Silky Smooth Lotion  Keri Silky Smooth Sensitive Skin Lotion  Nivea Body Creamy Conditioning Oil  Nivea Body Extra Enriched Lotion  Nivea Body Original Lotion  Nivea Body Sheer Moisturizing Lotion Nivea Crme  Nivea Skin Firming Lotion  NutraDerm 30 Skin Lotion  NutraDerm Skin Lotion  NutraDerm Therapeutic Skin Cream  NutraDerm Therapeutic Skin Lotion  ProShield Protective Hand Cream  Provon moisturizing lotion  Please read over the following fact sheets that you were given.

## 2023-06-30 ENCOUNTER — Encounter (HOSPITAL_COMMUNITY): Payer: Self-pay

## 2023-06-30 ENCOUNTER — Encounter (HOSPITAL_COMMUNITY)
Admission: RE | Admit: 2023-06-30 | Discharge: 2023-06-30 | Disposition: A | Discharge status: Home / Self Care | Payer: Medicare PPO | Source: Ambulatory Visit | Attending: Orthopedic Surgery | Admitting: Orthopedic Surgery

## 2023-06-30 ENCOUNTER — Other Ambulatory Visit: Payer: Self-pay

## 2023-06-30 VITALS — BP 160/78 | HR 69 | Temp 98.0°F | Resp 17 | Ht 70.5 in | Wt 198.9 lb

## 2023-06-30 DIAGNOSIS — I480 Paroxysmal atrial fibrillation: Secondary | ICD-10-CM | POA: Insufficient documentation

## 2023-06-30 DIAGNOSIS — Z8546 Personal history of malignant neoplasm of prostate: Secondary | ICD-10-CM | POA: Insufficient documentation

## 2023-06-30 DIAGNOSIS — R531 Weakness: Secondary | ICD-10-CM | POA: Diagnosis not present

## 2023-06-30 DIAGNOSIS — Z85828 Personal history of other malignant neoplasm of skin: Secondary | ICD-10-CM | POA: Diagnosis not present

## 2023-06-30 DIAGNOSIS — Z01812 Encounter for preprocedural laboratory examination: Secondary | ICD-10-CM | POA: Insufficient documentation

## 2023-06-30 DIAGNOSIS — M4726 Other spondylosis with radiculopathy, lumbar region: Secondary | ICD-10-CM | POA: Insufficient documentation

## 2023-06-30 DIAGNOSIS — Z01818 Encounter for other preprocedural examination: Secondary | ICD-10-CM

## 2023-06-30 DIAGNOSIS — R29898 Other symptoms and signs involving the musculoskeletal system: Secondary | ICD-10-CM | POA: Insufficient documentation

## 2023-06-30 DIAGNOSIS — Z8673 Personal history of transient ischemic attack (TIA), and cerebral infarction without residual deficits: Secondary | ICD-10-CM | POA: Insufficient documentation

## 2023-06-30 DIAGNOSIS — I10 Essential (primary) hypertension: Secondary | ICD-10-CM | POA: Diagnosis not present

## 2023-06-30 HISTORY — DX: Unspecified osteoarthritis, unspecified site: M19.90

## 2023-06-30 HISTORY — DX: Essential (primary) hypertension: I10

## 2023-06-30 LAB — SURGICAL PCR SCREEN
MRSA, PCR: NEGATIVE
Staphylococcus aureus: NEGATIVE

## 2023-06-30 LAB — BASIC METABOLIC PANEL
Anion gap: 9 (ref 5–15)
BUN: 16 mg/dL (ref 8–23)
CO2: 25 mmol/L (ref 22–32)
Calcium: 9.4 mg/dL (ref 8.9–10.3)
Chloride: 104 mmol/L (ref 98–111)
Creatinine, Ser: 0.81 mg/dL (ref 0.61–1.24)
GFR, Estimated: >60 mL/min (ref >60)
Glucose, Bld: 98 mg/dL (ref 70–99)
Potassium: 4.5 mmol/L (ref 3.5–5.1)
Sodium: 138 mmol/L (ref 135–145)

## 2023-06-30 LAB — CBC
HCT: 39.5 % (ref 39.0–52.0)
Hemoglobin: 13.9 g/dL (ref 13.0–17.0)
MCH: 32.6 pg (ref 26.0–34.0)
MCHC: 35.2 g/dL (ref 30.0–36.0)
MCV: 92.5 fL (ref 80.0–100.0)
Platelets: 195 10*3/uL (ref 150–400)
RBC: 4.27 MIL/uL (ref 4.22–5.81)
RDW: 12.3 % (ref 11.5–15.5)
WBC: 3.6 10*3/uL — ABNORMAL LOW (ref 4.0–10.5)
nRBC: 0 % (ref 0.0–0.2)

## 2023-06-30 LAB — TYPE AND SCREEN
ABO/RH(D): A NEG
Antibody Screen: NEGATIVE

## 2023-06-30 NOTE — Progress Notes (Signed)
PCP - Dr. Shireen Quan Cardiologist - Pt used to see [name]- pt saw [name], PA for cardiac clearance  PPM/ICD - denies   Chest x-ray - 09/11/20 EKG - 06/09/23 Stress Test - denies ECHO - 09/12/20 Cardiac Cath - denies  Sleep Study - denies   DM- denies  Blood Thinner Instructions: Hold Eliquis 3 days. Last dose 11/1 Aspirin Instructions: n/a  ERAS Protcol - yes PRE-SURGERY Ensure given at PAT  COVID TEST- n/a   Anesthesia review: yes, cardiac hx  Patient denies shortness of breath, fever, cough and chest pain at PAT appointment   All instructions explained to the patient, with a verbal understanding of the material. Patient agrees to go over the instructions while at home for a better understanding.  The opportunity to ask questions was provided.

## 2023-07-01 NOTE — Anesthesia Preprocedure Evaluation (Addendum)
Anesthesia Evaluation  Patient identified by MRN, date of birth, ID band Patient awake    Reviewed: Allergy & Precautions, NPO status , Patient's Chart, lab work & pertinent test results  History of Anesthesia Complications Negative for: history of anesthetic complications  Airway Mallampati: I  TM Distance: >3 FB Neck ROM: Full    Dental  (+) Dental Advisory Given   Pulmonary neg pulmonary ROS   breath sounds clear to auscultation       Cardiovascular hypertension, Pt. on medications (-) angina + dysrhythmias Atrial Fibrillation  Rhythm:Regular Rate:Normal  '22 ECHO: EF 67-70%, normal LVF, mild LVH, normal RVF, no significant valvular abnormalities   Neuro/Psych CVA, No Residual Symptoms  negative psych ROS   GI/Hepatic negative GI ROS, Neg liver ROS,,,  Endo/Other  negative endocrine ROS    Renal/GU negative Renal ROS   H/o prostate cancer, bladder cancer    Musculoskeletal  (+) Arthritis ,    Abdominal   Peds  Hematology Eliquis: last dose 10/31   Anesthesia Other Findings   Reproductive/Obstetrics                             Anesthesia Physical Anesthesia Plan  ASA: 3  Anesthesia Plan: General   Post-op Pain Management: Tylenol PO (pre-op)*   Induction: Intravenous  PONV Risk Score and Plan: 2 and Ondansetron, Dexamethasone and Treatment may vary due to age or medical condition  Airway Management Planned: Oral ETT  Additional Equipment: ClearSight  Intra-op Plan:   Post-operative Plan: Extubation in OR  Informed Consent: I have reviewed the patients History and Physical, chart, labs and discussed the procedure including the risks, benefits and alternatives for the proposed anesthesia with the patient or authorized representative who has indicated his/her understanding and acceptance.     Dental advisory given  Plan Discussed with: CRNA and Surgeon  Anesthesia Plan  Comments: (PAT note written 07/01/2023 by Shonna Chock, PA-C.  )       Anesthesia Quick Evaluation

## 2023-07-01 NOTE — Progress Notes (Signed)
Anesthesia Chart Review:  Case: [id number] Date/Time: 07/08/23 0715   Procedure: L2-3, L3-4 POSTERIOR LUMBAR FUSION 2 LEVEL / LAMINECTOMY   Anesthesia type: General   Pre-op diagnosis: LUMBAR RADICULOPATHY, NEUROLOGIC WEAKNESS   [facility]: [facility] ROOM 05 / [facility] OR   Surgeons: London Sheer, MD       DISCUSSION: Patient is an 82 year old male scheduled for the above procedure.   History includes never smoker, PAF, (diagnosed 10/2020) CVA (09/11/20), HTN, prostate cancer (s/p Gold seed implant 07/30/22), skin cancer, osteoarthritis.   He was evaluated by EP (Dr. Hillis Range, no longer with [facility]) for PAF diagnosed on monitor worn post CVA. Last EP evaluation was in 05/2022 with Ulyses Jarred and noted he was very active and doing well; However, EKG then showed new inferolateral ST depression. When cardiology contacted for preoperative input this was addressed by [name], PA. A stress test was considered but issues with insurance authorization, so ultimately decision made to repeat EKG to look for changes given he was asymptomatic. Per telephone encounter on 06/10/23 by [name], PA, "Immanuel Langridge was last seen on 05/22/2023 by [name] PA-C.  Since that day, Miranda Mcelhinny has done well without any exertional chest pain or shortness of breath.  He is able to exercise for 60 to 70 minutes on a daily basis without exertional symptoms.  Initially we recommended a stress test due to minor ST depression in the inferolateral leads that was seen on the previous EKG.  The stress test was delayed due to insurance issue.  We ended up repeating EKG on 06/09/2023 which was personally reviewed by myself and Dr. Tresa Endo.  The ST depression in the inferior lead has completely resolved.  There is still minimal ST depression in the lateral leads.  Patient is clearly able to achieve more than 4 metabolic level of activity without any symptoms.  I discussed the case with Dr. [name] again.  Given lack of  symptoms and the great exercise ability, we felt the patient is at acceptable risk to proceed with upcoming surgery.  We initially felt the patient would be a low risk candidate proceeding with a moderate risk procedure if the stress test is normal, however given minimal EKG changes, he would have slightly higher risk than we initially anticipated.  He will be a moderate risk patient going through a moderate risk procedure.  The risk is not prohibitive.  He is at acceptable risk to proceed.  He has been instructed to hold Eliquis for 3 days prior to the procedure and restart as soon as possible afterward at the surgeon's discretion."  Anesthesia team to evaluate on the day of surgery.    VS: BP (!) 160/78   Pulse 69   Temp 36.7 C   Resp 17   Ht 5' 10.5" (1.791 m)   Wt 90.2 kg   SpO2 100%   BMI 28.14 kg/m   PROVIDERS: Shireen Quan, DO is PCP    LABS: Labs reviewed: Acceptable for surgery. (all labs ordered are listed, but only abnormal results are displayed)  Labs Reviewed  CBC - Abnormal; Notable for the following components:      Result Value   WBC 3.6 (*)    All other components within normal limits  SURGICAL PCR SCREEN  BASIC METABOLIC PANEL  TYPE AND SCREEN     IMAGES: MRI L-spine 04/19/23: IMPRESSION: 1. Multilevel spondylosis of the lumbar spine as described. 2. Severe central and bilateral foraminal  stenosis at L3-4 secondary to grade 2 anterolisthesis, uncovering of a broad-based disc protrusion, and advanced facet hypertrophy. 3. Moderate central and bilateral foraminal stenosis at L2-3. 4. Mild subarticular and moderate foraminal stenosis bilaterally at L4-5 is worse on the right. 5. Mild subarticular and moderate foraminal stenosis bilaterally at L5-S1. 6. Mild bilateral subarticular and moderate bilateral foraminal stenosis at L1-2.   EKG: 06/09/23: Sinus bradycardia with 1st degree A-V block ST depression in inferior leads resolved Minimal ST depression  in the lateral leads Confirmed by [name] [phone]) on 06/10/2023 8:34:57 AM   CV: Echo 09/12/20: IMPRESSIONS   1. Left ventricular ejection fraction, by estimation, is 65 to 70%. The  left ventricle has normal function. The left ventricle has no regional  wall motion abnormalities. There is mild left ventricular hypertrophy.  Left ventricular diastolic parameters  are consistent with Grade I diastolic dysfunction (impaired relaxation).   2. Right ventricular systolic function is normal. The right ventricular  size is normal. Tricuspid regurgitation signal is inadequate for assessing  PA pressure.   3. The mitral valve is normal in structure. Trivial mitral valve  regurgitation. No evidence of mitral stenosis.   4. The aortic valve is tricuspid. Aortic valve regurgitation is not  visualized. Mild aortic valve sclerosis is present, with no evidence of  aortic valve stenosis.   5. The inferior vena cava is normal in size with greater than 50%  respiratory variability, suggesting right atrial pressure of 3 mmHg.   Cardiac event monitor 10/11/20 - 11/09/20: Sinus rhythm is predominant rhythm Atrial fibrillation and atrial flutter episodes are noted.   Patient started on eliquis (10/30/20 notes reviewed) Neurology team is aware.   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, and also treated for prostate cancer   Dysrhythmia    Hypertension    Paroxysmal atrial fibrillation (HCC) 10/29/2020   Stroke (cerebrum) [facility])     Past Surgical History:  Procedure Laterality Date   CARPAL TUNNEL RELEASE Right 11/26/2021   Procedure: RIGHT CARPAL TUNNEL RELEASE;  Surgeon: Marlyne Beards, MD;  Location: [facility];  Service: Orthopedics;  Laterality: Right;   GOLD SEED IMPLANT N/A 07/30/2022    Procedure: GOLD SEED IMPLANT;  Surgeon: Noel Christmas, MD;  Location: [facility];  Service: Urology;  Laterality: N/A;   PROSTATE BIOPSY N/A 04/24/2022   Procedure: BIOPSY TRANSRECTAL ULTRASONIC PROSTATE (TUBP);  Surgeon: Rene Paci, MD;  Location: [facility];  Service: Urology;  Laterality: N/A;  ONLY NEEDS 60 MIN FOR BOTH   SPACE OAR INSTILLATION N/A 07/30/2022   Procedure: SPACE OAR INSTILLATION;  Surgeon: Noel Christmas, MD;  Location: [facility];  Service: Urology;  Laterality: N/A;   TONSILLECTOMY     and adenioids removed as a child   TRANSURETHRAL RESECTION OF BLADDER TUMOR N/A 04/24/2022   Procedure: POSSIBLE TRANSURETHRAL RESECTION OF BLADDER TUMOR (TURBT);  Surgeon: Rene Paci, MD;  Location: [facility];  Service: Urology;  Laterality: N/A;    MEDICATIONS:  acetaminophen (TYLENOL) 500 MG tablet   amLODipine (NORVASC) 5 MG tablet   b complex vitamins capsule   Coenzyme Q10 200 MG capsule   COLLAGEN PO   D-RIBOSE PO   ELIQUIS 5 MG TABS tablet   magnesium  gluconate (MAGONATE) 500 MG tablet   Omega 3 1200 MG CAPS   No current facility-administered medications for this encounter.    Shonna Chock, PA-C Surgical Short Stay/Anesthesiology [facility] Phone [phone] [facility] Phone [phone] 07/01/2023 12:38 PM

## 2023-07-07 NOTE — H&P (Signed)
Orthopedic Spine Surgery H&P Note  Assessment: Patient is a 82 y.o. male with lumbar radiculopathy and neurologic weakness   Plan: -Out of bed as tolerated, activity as tolerated, no brace -Covered the risks of surgery one more time with the patient and patient elected to proceed with planned surgery -Written consent verified -Hold anticoagulation in anticipation of surgery -Ancef and TXA on all to OR -NPO for procedure -Site marked -To OR when ready   The risks covered this morning with the patient included but were not limited to: dural tear, nerve root injury, paralysis, persistent pain, pseudarthrosis, infection, bleeding, hardware failure/malposition, adjacent segment disease, heart attack, death, dvt/pe, fracture, and need for additional procedures. After discussing these potential complications, patient wanted to proceed with surgery.  ___________________________________________________________________________  Chief Complaint: left leg pain and weakness  History: Patient is 82 y.o. male who has been previously seen in the office for left leg pain from the knee to the foot over the anterolateral aspect. He also has noticed weakness in the leg and abnormal gait as well as a result. His symptoms failed to improve with conservative treatment so operative management was discussed at the last office visit. The patient presents today with no changes in their symptoms since the last office visit. See previous office note for further details.    Review of systems: General: denies fevers and chills, myalgias Neurologic: denies recent changes in vision, slurred speech Abdomen: denies nausea, vomiting, hematemesis Respiratory: denies cough, shortness of breath  Past medical history: Atrial fibrillation Prostate cancer Hypertension History of stroke   Allergies: NKDA   Past surgical history:  Right carpal tunnel release Transurethral resection of bladder tumor Prostate biopsy Gold  seed implantation   Social history: Denies use of nicotine product (smoking, vaping, patches, smokeless) Alcohol use: Yes, approximately 2 drinks per week Denies recreational drug use   Physical Exam:  General: no acute distress, appears stated age Neurologic: alert, answering questions appropriately, following commands Cardiovascular: regular rate, no cyanosis Respiratory: unlabored breathing on room air, symmetric chest rise Psychiatric: appropriate affect, normal cadence to speech   MSK (spine):  -Strength exam      Left  Right  EHL    3/5  5/5 TA    4/5  5/5 GSC    5/5  5/5 Knee extension  5/5  5/5 Knee flexion   5/5  5/5 Hip flexion   5/5  5/5  -Sensory exam    Sensation intact to light touch in L3-S1 nerve distributions of bilateral lower extremities   Patient name: James Ramsey Patient MRN: [id number] Date: 07/08/2023

## 2023-07-08 ENCOUNTER — Inpatient Hospital Stay (HOSPITAL_COMMUNITY): Payer: Medicare PPO | Admitting: Vascular Surgery

## 2023-07-08 ENCOUNTER — Encounter (HOSPITAL_COMMUNITY): Payer: Self-pay | Admitting: Orthopedic Surgery

## 2023-07-08 ENCOUNTER — Inpatient Hospital Stay (HOSPITAL_COMMUNITY): Payer: Self-pay | Admitting: Anesthesiology

## 2023-07-08 ENCOUNTER — Inpatient Hospital Stay (HOSPITAL_COMMUNITY): Payer: Medicare PPO

## 2023-07-08 ENCOUNTER — Other Ambulatory Visit: Payer: Self-pay

## 2023-07-08 ENCOUNTER — Inpatient Hospital Stay (HOSPITAL_COMMUNITY)
Admission: RE | Disposition: A | Discharge status: Home / Self Care | Payer: Self-pay | Source: Ambulatory Visit | Attending: Orthopedic Surgery

## 2023-07-08 ENCOUNTER — Inpatient Hospital Stay (HOSPITAL_COMMUNITY)
Admission: RE | Admit: 2023-07-08 | Discharge: 2023-07-09 | DRG: 448 | Disposition: A | Discharge status: Home / Self Care | Payer: Medicare PPO | Source: Ambulatory Visit | Attending: Orthopedic Surgery | Admitting: Orthopedic Surgery

## 2023-07-08 DIAGNOSIS — I10 Essential (primary) hypertension: Secondary | ICD-10-CM | POA: Diagnosis not present

## 2023-07-08 DIAGNOSIS — Y838 Other surgical procedures as the cause of abnormal reaction of the patient, or of later complication, without mention of misadventure at the time of the procedure: Secondary | ICD-10-CM | POA: Diagnosis not present

## 2023-07-08 DIAGNOSIS — G9741 Accidental puncture or laceration of dura during a procedure: Secondary | ICD-10-CM | POA: Diagnosis not present

## 2023-07-08 DIAGNOSIS — Z8673 Personal history of transient ischemic attack (TIA), and cerebral infarction without residual deficits: Secondary | ICD-10-CM

## 2023-07-08 DIAGNOSIS — M4316 Spondylolisthesis, lumbar region: Secondary | ICD-10-CM | POA: Diagnosis present

## 2023-07-08 DIAGNOSIS — M5416 Radiculopathy, lumbar region: Secondary | ICD-10-CM | POA: Diagnosis not present

## 2023-07-08 DIAGNOSIS — M48061 Spinal stenosis, lumbar region without neurogenic claudication: Principal | ICD-10-CM | POA: Diagnosis present

## 2023-07-08 DIAGNOSIS — I4891 Unspecified atrial fibrillation: Secondary | ICD-10-CM | POA: Diagnosis present

## 2023-07-08 DIAGNOSIS — R29898 Other symptoms and signs involving the musculoskeletal system: Secondary | ICD-10-CM | POA: Diagnosis not present

## 2023-07-08 DIAGNOSIS — Z8546 Personal history of malignant neoplasm of prostate: Secondary | ICD-10-CM

## 2023-07-08 DIAGNOSIS — D62 Acute posthemorrhagic anemia: Secondary | ICD-10-CM | POA: Diagnosis not present

## 2023-07-08 DIAGNOSIS — M51362 Other intervertebral disc degeneration, lumbar region with discogenic back pain and lower extremity pain: Secondary | ICD-10-CM | POA: Diagnosis not present

## 2023-07-08 LAB — ABO/RH: ABO/RH(D): A NEG

## 2023-07-08 SURGERY — POSTERIOR LUMBAR FUSION 2 LEVEL
Anesthesia: General

## 2023-07-08 MED ORDER — PROPOFOL 10 MG/ML IV BOLUS
INTRAVENOUS | Status: AC
  Filled 2023-07-08: qty 20

## 2023-07-08 MED ORDER — EPHEDRINE SULFATE-NACL 50-0.9 MG/10ML-% IV SOSY
PREFILLED_SYRINGE | INTRAVENOUS | Status: DC | PRN
  Administered 2023-07-08 (×2): 10 mg via INTRAVENOUS

## 2023-07-08 MED ORDER — POLYVINYL ALCOHOL 1.4 % OP SOLN
1.0000 [drp] | OPHTHALMIC | Status: DC | PRN
  Filled 2023-07-08: qty 15

## 2023-07-08 MED ORDER — MEPERIDINE HCL 25 MG/ML IJ SOLN: 6.2500 mg | INTRAMUSCULAR | Status: DC | PRN

## 2023-07-08 MED ORDER — CHLORHEXIDINE GLUCONATE 0.12 % MT SOLN
15.0000 mL | Freq: Once | OROMUCOSAL | Status: AC
  Administered 2023-07-08: 15 mL via OROMUCOSAL
  Filled 2023-07-08: qty 15

## 2023-07-08 MED ORDER — DEXAMETHASONE SODIUM PHOSPHATE 10 MG/ML IJ SOLN
INTRAMUSCULAR | Status: AC
Start: 2023-07-08 — End: ?
  Filled 2023-07-08: qty 1

## 2023-07-08 MED ORDER — ORAL CARE MOUTH RINSE: 15.0000 mL | Freq: Once | OROMUCOSAL | Status: AC

## 2023-07-08 MED ORDER — CEFAZOLIN SODIUM-DEXTROSE 2-4 GM/100ML-% IV SOLN
2.0000 g | Freq: Four times a day (QID) | INTRAVENOUS | Status: AC
  Administered 2023-07-08 – 2023-07-09 (×3): 2 g via INTRAVENOUS
  Filled 2023-07-08 (×3): qty 100

## 2023-07-08 MED ORDER — HYDROMORPHONE HCL 1 MG/ML IJ SOLN: 0.2500 mg | INTRAMUSCULAR | Status: DC | PRN

## 2023-07-08 MED ORDER — NEOSTIGMINE METHYLSULFATE 10 MG/10ML IV SOLN
INTRAVENOUS | Status: DC | PRN
  Administered 2023-07-08: 2 mg via INTRAVENOUS

## 2023-07-08 MED ORDER — SUCCINYLCHOLINE CHLORIDE 200 MG/10ML IV SOSY
PREFILLED_SYRINGE | INTRAVENOUS | Status: DC | PRN
  Administered 2023-07-08: 120 mg via INTRAVENOUS

## 2023-07-08 MED ORDER — ACETAMINOPHEN 500 MG PO TABS
1000.0000 mg | ORAL_TABLET | Freq: Once | ORAL | Status: AC
  Administered 2023-07-08: 1000 mg via ORAL
  Filled 2023-07-08: qty 2

## 2023-07-08 MED ORDER — POLYETHYLENE GLYCOL 3350 17 G PO PACK
17.0000 g | PACK | Freq: Two times a day (BID) | ORAL | Status: DC
  Filled 2023-07-08 (×2): qty 1

## 2023-07-08 MED ORDER — ONDANSETRON HCL 4 MG/2ML IJ SOLN
INTRAMUSCULAR | Status: DC | PRN
  Administered 2023-07-08: 4 mg via INTRAVENOUS

## 2023-07-08 MED ORDER — OXYCODONE HCL 5 MG PO TABS: 5.0000 mg | ORAL_TABLET | Freq: Once | ORAL | Status: DC | PRN

## 2023-07-08 MED ORDER — PROPOFOL 10 MG/ML IV BOLUS
INTRAVENOUS | Status: DC | PRN
  Administered 2023-07-08: 150 mg via INTRAVENOUS

## 2023-07-08 MED ORDER — MIDAZOLAM HCL 2 MG/2ML IJ SOLN
INTRAMUSCULAR | Status: AC
  Filled 2023-07-08: qty 2

## 2023-07-08 MED ORDER — FENTANYL CITRATE (PF) 250 MCG/5ML IJ SOLN
INTRAMUSCULAR | Status: DC | PRN
  Administered 2023-07-08: 200 ug via INTRAVENOUS
  Administered 2023-07-08: 50 ug via INTRAVENOUS

## 2023-07-08 MED ORDER — CEFAZOLIN SODIUM-DEXTROSE 2-4 GM/100ML-% IV SOLN
2.0000 g | INTRAVENOUS | Status: AC
  Administered 2023-07-08 (×2): 2 g via INTRAVENOUS
  Filled 2023-07-08: qty 100

## 2023-07-08 MED ORDER — ONDANSETRON HCL 4 MG PO TABS: 4.0000 mg | ORAL_TABLET | Freq: Four times a day (QID) | ORAL | Status: DC | PRN

## 2023-07-08 MED ORDER — TRANEXAMIC ACID-NACL 1000-0.7 MG/100ML-% IV SOLN
1000.0000 mg | INTRAVENOUS | Status: AC
  Administered 2023-07-08: 1000 mg via INTRAVENOUS
  Filled 2023-07-08: qty 100

## 2023-07-08 MED ORDER — REMIFENTANIL HCL 2 MG IV SOLR
INTRAVENOUS | Status: AC
  Filled 2023-07-08: qty 2000

## 2023-07-08 MED ORDER — PROPOFOL 1000 MG/100ML IV EMUL
INTRAVENOUS | Status: AC
  Filled 2023-07-08: qty 400

## 2023-07-08 MED ORDER — METHOCARBAMOL 500 MG PO TABS
500.0000 mg | ORAL_TABLET | Freq: Four times a day (QID) | ORAL | Status: DC
  Administered 2023-07-08 – 2023-07-09 (×3): 500 mg via ORAL
  Filled 2023-07-08 (×3): qty 1

## 2023-07-08 MED ORDER — HYDROMORPHONE HCL 1 MG/ML IJ SOLN
INTRAMUSCULAR | Status: DC | PRN
  Administered 2023-07-08: .5 mg via INTRAVENOUS

## 2023-07-08 MED ORDER — CHLORHEXIDINE GLUCONATE CLOTH 2 % EX PADS
6.0000 | MEDICATED_PAD | Freq: Every day | CUTANEOUS | Status: DC
  Administered 2023-07-08 – 2023-07-09 (×2): 6 via TOPICAL

## 2023-07-08 MED ORDER — SENNA 8.6 MG PO TABS
1.0000 | ORAL_TABLET | Freq: Two times a day (BID) | ORAL | Status: DC
  Filled 2023-07-08 (×2): qty 1

## 2023-07-08 MED ORDER — FENTANYL CITRATE (PF) 250 MCG/5ML IJ SOLN
INTRAMUSCULAR | Status: AC
  Filled 2023-07-08: qty 5

## 2023-07-08 MED ORDER — ALBUMIN HUMAN 5 % IV SOLN: INTRAVENOUS | Status: DC | PRN

## 2023-07-08 MED ORDER — ONDANSETRON HCL 4 MG/2ML IJ SOLN
INTRAMUSCULAR | Status: AC
  Filled 2023-07-08: qty 2

## 2023-07-08 MED ORDER — HYDROMORPHONE HCL 1 MG/ML IJ SOLN
INTRAMUSCULAR | Status: AC
  Filled 2023-07-08: qty 0.5

## 2023-07-08 MED ORDER — LIDOCAINE 2% (20 MG/ML) 5 ML SYRINGE
INTRAMUSCULAR | Status: AC
Start: 2023-07-08 — End: ?
  Filled 2023-07-08: qty 5

## 2023-07-08 MED ORDER — CEFAZOLIN SODIUM 1 G IJ SOLR
INTRAMUSCULAR | Status: AC
  Filled 2023-07-08: qty 20

## 2023-07-08 MED ORDER — PROPOFOL 1000 MG/100ML IV EMUL
INTRAVENOUS | Status: AC
  Filled 2023-07-08: qty 100

## 2023-07-08 MED ORDER — ONDANSETRON HCL 4 MG/2ML IJ SOLN: 4.0000 mg | Freq: Four times a day (QID) | INTRAMUSCULAR | Status: DC | PRN

## 2023-07-08 MED ORDER — SODIUM CHLORIDE 0.9 % IV SOLN: INTRAVENOUS | Status: DC | PRN

## 2023-07-08 MED ORDER — HYDROMORPHONE HCL 1 MG/ML IJ SOLN: 1.0000 mg | INTRAMUSCULAR | Status: AC | PRN

## 2023-07-08 MED ORDER — SUCCINYLCHOLINE CHLORIDE 200 MG/10ML IV SOSY
PREFILLED_SYRINGE | INTRAVENOUS | Status: AC
Start: 2023-07-08 — End: ?
  Filled 2023-07-08: qty 10

## 2023-07-08 MED ORDER — GLYCOPYRROLATE 0.2 MG/ML IJ SOLN
INTRAMUSCULAR | Status: DC | PRN
  Administered 2023-07-08: .2 mg via INTRAVENOUS

## 2023-07-08 MED ORDER — ROCURONIUM BROMIDE 10 MG/ML (PF) SYRINGE
PREFILLED_SYRINGE | INTRAVENOUS | Status: DC | PRN
  Administered 2023-07-08: 30 mg via INTRAVENOUS

## 2023-07-08 MED ORDER — THROMBIN 20000 UNITS EX SOLR
CUTANEOUS | Status: DC | PRN
  Administered 2023-07-08: 20 mL via TOPICAL

## 2023-07-08 MED ORDER — 0.9 % SODIUM CHLORIDE (POUR BTL) OPTIME
TOPICAL | Status: DC | PRN
  Administered 2023-07-08: 1000 mL

## 2023-07-08 MED ORDER — LACTATED RINGERS IV SOLN: INTRAVENOUS | Status: DC

## 2023-07-08 MED ORDER — THROMBIN 20000 UNITS EX KIT
PACK | CUTANEOUS | Status: AC
  Filled 2023-07-08: qty 1

## 2023-07-08 MED ORDER — SODIUM CHLORIDE 0.9 % IV SOLN
0.1500 ug/kg/min | INTRAVENOUS | Status: DC
  Administered 2023-07-08: .2 ug/kg/min via INTRAVENOUS
  Filled 2023-07-08 (×3): qty 2000

## 2023-07-08 MED ORDER — VANCOMYCIN HCL 1000 MG IV SOLR
INTRAVENOUS | Status: DC | PRN
  Administered 2023-07-08: 1000 mg via TOPICAL

## 2023-07-08 MED ORDER — PROPOFOL 500 MG/50ML IV EMUL
INTRAVENOUS | Status: DC | PRN
  Administered 2023-07-08: 120 ug/kg/min via INTRAVENOUS
  Administered 2023-07-08: 200 ug/kg/min via INTRAVENOUS

## 2023-07-08 MED ORDER — POVIDONE-IODINE 10 % EX SWAB
2.0000 | Freq: Once | CUTANEOUS | Status: AC
  Administered 2023-07-08: 2 via TOPICAL

## 2023-07-08 MED ORDER — ACETAMINOPHEN 500 MG PO TABS
1000.0000 mg | ORAL_TABLET | Freq: Three times a day (TID) | ORAL | Status: DC
  Administered 2023-07-08 – 2023-07-09 (×3): 1000 mg via ORAL
  Filled 2023-07-08 (×3): qty 2

## 2023-07-08 MED ORDER — OXYCODONE HCL 5 MG PO TABS: 5.0000 mg | ORAL_TABLET | ORAL | Status: DC | PRN

## 2023-07-08 MED ORDER — OXYCODONE HCL 5 MG/5ML PO SOLN: 5.0000 mg | Freq: Once | ORAL | Status: DC | PRN

## 2023-07-08 MED ORDER — KETAMINE HCL 50 MG/5ML IJ SOSY
PREFILLED_SYRINGE | INTRAMUSCULAR | Status: AC
  Filled 2023-07-08: qty 5

## 2023-07-08 MED ORDER — VANCOMYCIN HCL 1000 MG IV SOLR
INTRAVENOUS | Status: AC
  Filled 2023-07-08: qty 20

## 2023-07-08 MED ORDER — MIDAZOLAM HCL 2 MG/2ML IJ SOLN: 0.5000 mg | Freq: Once | INTRAMUSCULAR | Status: DC | PRN

## 2023-07-08 MED ORDER — TRANEXAMIC ACID-NACL 1000-0.7 MG/100ML-% IV SOLN
1000.0000 mg | Freq: Once | INTRAVENOUS | Status: AC
  Administered 2023-07-08: 1000 mg via INTRAVENOUS
  Filled 2023-07-08 (×2): qty 100

## 2023-07-08 MED ORDER — DEXAMETHASONE SODIUM PHOSPHATE 10 MG/ML IJ SOLN
10.0000 mg | Freq: Once | INTRAMUSCULAR | Status: AC
  Administered 2023-07-08: 10 mg via INTRAVENOUS
  Filled 2023-07-08: qty 1

## 2023-07-08 MED ORDER — PHENYLEPHRINE HCL-NACL 20-0.9 MG/250ML-% IV SOLN
INTRAVENOUS | Status: DC | PRN
  Administered 2023-07-08: 40 ug/min via INTRAVENOUS

## 2023-07-08 MED ORDER — AMLODIPINE BESYLATE 5 MG PO TABS
5.0000 mg | ORAL_TABLET | Freq: Every day | ORAL | Status: DC
  Administered 2023-07-09: 5 mg via ORAL
  Filled 2023-07-08: qty 1

## 2023-07-08 SURGICAL SUPPLY — 68 items
APL SKNCLS STERI-STRIP NONHPOA (GAUZE/BANDAGES/DRESSINGS) ×1
BENZOIN TINCTURE PRP APPL 2/3 (GAUZE/BANDAGES/DRESSINGS) ×1 IMPLANT
BONE FIBERS PLIAFX 10 (Bone Implant) ×1 IMPLANT
BUR NEURO DRILL SOFT 3.0X3.8M (BURR) ×1 IMPLANT
CLIP NEUROVISION LG (NEUROSURGERY SUPPLIES) IMPLANT
CLSR STERI-STRIP ANTIMIC 1/2X4 (GAUZE/BANDAGES/DRESSINGS) ×1 IMPLANT
CORD BIPOLAR FORCEPS 12FT (ELECTRODE) IMPLANT
COUNTER NDL 20CT MAGNET RED (NEEDLE) IMPLANT
COVER SURGICAL LIGHT HANDLE (MISCELLANEOUS) ×1 IMPLANT
DRAIN HEMOVAC 7FR (DRAIN) ×2 IMPLANT
DRAPE C-ARM 42X72 X-RAY (DRAPES) ×1 IMPLANT
DRAPE MICROSCOPE LEICA 54X105 (DRAPES) ×1 IMPLANT
DRAPE SURG 17X23 STRL (DRAPES) IMPLANT
DRAPE UTILITY XL STRL (DRAPES) ×2 IMPLANT
DRESSING MEPILEX FLEX 4X4 (GAUZE/BANDAGES/DRESSINGS) ×1 IMPLANT
DRSG MEPILEX FLEX 4X4 (GAUZE/BANDAGES/DRESSINGS) ×1
DRSG MEPILEX POST OP 4X8 (GAUZE/BANDAGES/DRESSINGS) IMPLANT
DRSG TEGADERM 4X10 (GAUZE/BANDAGES/DRESSINGS) ×1 IMPLANT
DRSG TEGADERM 4X4.5 CHG (GAUZE/BANDAGES/DRESSINGS) IMPLANT
DURAPREP 26ML APPLICATOR (WOUND CARE) ×1 IMPLANT
DURASEAL SPINE SEALANT 5 POLY (MISCELLANEOUS) IMPLANT
ELECT COATED BLADE 2.86 ST (ELECTRODE) ×1 IMPLANT
ELECT NVM5 SURFACE MEP/EMG (ELECTRODE) IMPLANT
ELECT PENCIL ROCKER SW 15FT (MISCELLANEOUS) ×1 IMPLANT
ELECT REM PT RETURN 9FT ADLT (ELECTROSURGICAL) ×1
ELECTRODE REM PT RTRN 9FT ADLT (ELECTROSURGICAL) ×1 IMPLANT
EVACUATOR 1/8 PVC DRAIN (DRAIN) IMPLANT
GAUZE SPONGE 4X4 12PLY STRL (GAUZE/BANDAGES/DRESSINGS) ×1 IMPLANT
GLOVE BIO SURGEON STRL SZ7.5 (GLOVE) ×1 IMPLANT
GLOVE BIOGEL PI IND STRL 7.5 (GLOVE) ×1 IMPLANT
GLOVE INDICATOR 8.0 STRL GRN (GLOVE) ×1 IMPLANT
GOWN STRL REUS W/ TWL XL LVL3 (GOWN DISPOSABLE) ×1 IMPLANT
GOWN STRL REUS W/TWL XL LVL3 (GOWN DISPOSABLE) ×1
GRAFT BNE FBR PLIAFX PRIME 10 (Bone Implant) IMPLANT
GRAFT DURAGEN MATRIX 2WX2L IMPLANT
KIT BASIN OR (CUSTOM PROCEDURE TRAY) ×1 IMPLANT
KIT POSITION SURG JACKSON T1 (MISCELLANEOUS) ×1 IMPLANT
KIT TURNOVER KIT B (KITS) ×1 IMPLANT
MANIFOLD NEPTUNE II (INSTRUMENTS) IMPLANT
MODULE EMG NDL SSEP NVM5 (NEUROSURGERY SUPPLIES) IMPLANT
MODULE EMG NEEDLE SSEP NVM5 (NEUROSURGERY SUPPLIES) ×1
NDL 22X1.5 STRL (OR ONLY) (MISCELLANEOUS) ×1 IMPLANT
NEEDLE 22X1.5 STRL (OR ONLY) (MISCELLANEOUS) ×1
NS IRRIG 1000ML POUR BTL (IV SOLUTION) ×1 IMPLANT
PACK LAMINECTOMY ORTHO (CUSTOM PROCEDURE TRAY) ×1 IMPLANT
PATTIES SURGICAL .5 X.5 (GAUZE/BANDAGES/DRESSINGS) IMPLANT
PROBE BALL TIP NVM5 SNG USE (NEUROSURGERY SUPPLIES) IMPLANT
ROD RELINE TI LORD 5.5X70 (Rod) IMPLANT
SCREW LOCK RELINE 5.5 TULIP (Screw) IMPLANT
SCREW RELINE-O 6.5X55 POLY (Screw) IMPLANT
SCREW RELINE-O POLY 6.5X50MM (Screw) IMPLANT
SPONGE SURGIFOAM ABS GEL 100 (HEMOSTASIS) ×1 IMPLANT
SPONGE T-LAP 4X18 ~~LOC~~+RFID (SPONGE) ×1 IMPLANT
SUCTION TUBE FRAZIER 10FR DISP (SUCTIONS) ×1 IMPLANT
SUT BONE WAX W31G (SUTURE) ×1 IMPLANT
SUT ETHILON 2 0 FS 18 (SUTURE) IMPLANT
SUT PROLENE 6 0 BV (SUTURE) IMPLANT
SUT VIC AB 0 CT1 18XCR BRD8 (SUTURE) ×1 IMPLANT
SUT VIC AB 0 CT1 8-18 (SUTURE) ×2
SUT VIC AB 2-0 CT2 18 VCP726D (SUTURE) IMPLANT
SUT VIC AB 3-0 PS2 18 (SUTURE) ×1
SUT VIC AB 3-0 PS2 18XBRD (SUTURE) ×1 IMPLANT
SYR BULB IRRIG 60ML STRL (SYRINGE) ×1 IMPLANT
SYR CONTROL 10ML LL (SYRINGE) ×1 IMPLANT
TOWEL GREEN STERILE (TOWEL DISPOSABLE) ×1 IMPLANT
TOWEL GREEN STERILE FF (TOWEL DISPOSABLE) ×1 IMPLANT
TUBING FEATHERFLOW (TUBING) ×1 IMPLANT
WATER STERILE IRR 1000ML POUR (IV SOLUTION) ×1 IMPLANT

## 2023-07-08 NOTE — Op Note (Addendum)
Orthopedic Spine Surgery Operative Report  Procedure: L2, L3, L4 lumbar laminectomies with left sided foraminotomies L2/3 and L3/4 posterior lumbar spinal arthrodesis L2, L3, L4 pedicle screw instrumentation with rods Application of locally harvested autograft (spinous processes, removed lamina) Application of allograft morselized bone and demineralized bone matrix  Modifier: none  Date of procedure: 07/08/2023  Patient name: James Ramsey MRN: [id number] DOB: [birth year]  Surgeon: Willia Craze, MD Assistant: None Pre-operative diagnosis: lumbar radiculopathy, lumbar spondylolisthesis, neurologic deficit Post-operative diagnosis: same as above Findings: L2/3 and L3/4 hypertrophic facets and thickened ligamentum flavum, L3/4 spondylolisthesis  Specimens: none Anesthesia: general EBL: 250cc Complications: durotomy s/p repair Pre-incision antibiotic: ancef TXA was given prior to incision as well  Implants:  Implant Name Type Inv. Item Serial No. Manufacturer Lot No. LRB No. Used Action  BONE FIBERS PLIAFX 10 - [id number] Bone Implant BONE FIBERS PLIAFX 10 [id number] [facility]  N/A 1 Implanted  GRAFT DURAGEN MATRIX 2WX2L - [id number]  GRAFT DURAGEN MATRIX 2WX2L  [employer] [id number] N/A 1 Implanted  SCREW RELINE-O POLY 6.5X50MM - [id number] Screw SCREW RELINE-O POLY 6.5X50MM  [employer]  N/A 2 Implanted  SCREW RELINE-O 6.5X55 POLY - [id number] Screw SCREW RELINE-O 6.5X55 POLY  [employer]  N/A 4 Implanted  SCREW LOCK RELINE 5.5 [place] - [id number] Screw SCREW LOCK RELINE 5.5 [place]  [employer]  N/A 6 Implanted  ROD RELINE TI LORD 5.5X70 - [id number] Rod ROD RELINE TI LORD 5.5X70  [employer]  N/A 2 Implanted    Indication for procedure: Patient is a 82 y.o. male who presented to the office with symptoms consistent with lumbar radiculopathy. The patient had tried conservative treatments that did not provide any benefit. As result, operative management  was discussed. L2, L3, L4 segment laminectomies with left sided foraminotomies and posterior instrumented spinal fusion was presented as a treatment option. The risks including but not limited to dural tear, nerve root injury, paralysis, persistent pain, pseudarthrosis, infection, bleeding, hardware failure, adjacent segment disease, heart attack, death, stroke, fracture, blindness, and need for additional procedures were discussed with the patient. The benefit of the surgery would be improvement in the patient's weakness, numbness, and pain radiating into the legs. I explained that weakness with take time to recover and may not full recover. The alternatives to surgical management were covered with the patient and included continued monitoring, physical therapy, over-the-counter pain medications, ambulatory aids, injections, and activity modification. All the patient's questions were answered to his satisfaction. After this discussion, the patient expressed understanding and elected to proceed with surgical intervention.    Procedure Description: The patient was met in the pre-operative holding area. The patient's identity and consent were verified. The operative site was marked. The patient's remaining questions about the surgery were answered. The patient was brought back to the operating room. General anesthesia was induced and an endotracheal tube was placed by the anesthesia staff. The neuromonitoring technologist attached leads to the patient. The patient was transferred to the prone [place] table in the prone position. All bony prominences were well padded. The head of the bed was slightly elevated and the eyes were free from compression by the face pillow. An electric razor was used to remove his hair over the lumbar region. The surgical area was cleansed with alcohol. Baseline neuromonitoring signals were obtained. Fluoroscopy was then brought in to check rotation on the AP image and to mark the levels  on the lateral image. The patient's skin was then prepped and draped in a  standard, sterile fashion. A time out was performed that identified the patient, the procedure, and the operative levels. All team members agreed with what was stated in the time out.   A midline incision over the spinous processes of the previously marked levels was made and sharp dissection was continued down through the skin and dermis. Electrocautery was then used to continue the midline dissection down to the level of the spinous process. Subperiosteal dissection was performed using electrocautery to expose the lamina out lateral to the facet joint capsule. Care was taken to not violate the facet joint capsules. A lateral fluoroscopic image was taken to confirm the level. Subperiosteal dissection with electrocautery was then done to expose the lamina at L2, L3, L4. Electrocautery was used to remove the facet capsules at L2/3 and L3/4. The L1/2 facet capsules were preserved. Electrocautery was then used to dissect lateral the facets to find the transverse process at each level. Electrocautery was used to clear the soft tissue off the transverse processes at L2, L3, L4 bilaterally.   Next, the pedicle screws were placed. The starting points for the pedicle screws were identified using anatomical landmarks. Fluoroscopy was brought in in the AP view to confirm the start points. Each pedicle screw was placed under AP fluoroscopy. A true AP was obtained of the vertebra where screw was being placed. Then, a pilot hole was made with a 3mm matchstick burr at the start point. A pedicle finder was advanced into the pilot hole. AP fluoroscopy was used to confirm proper starting point. A image was taken when the pedicle finder had been advanced 10mm, 15mm, and at 20mm. The pedicle finder remained within the pedicle and did not breach medially at any of those markers. The pedicle finder was then advanced to 35mm and then withdrawn. A feeler was  placed into the hole and confirmed no pedicle wall breaches. It was advanced to the ventral cortex by palpation. The screw length was then estimated off of the depth of the feeler. The widths of the screws had been predetermined as they were measured pre-operatively on the patient's advanced imaging. That measured length and pre-determined width screw was then inserted under AP fluoroscopic guidance. The screw was advanced approximately 10mm, 15mm, and then 20mm. At each one of those intervals an image was taken to confirm that the screw was following the trajectory of the pedicle and had not breached medially. The screw was then advanced fully until there was good purchase. This process was repeated for every pedicle screw.     The screws that were inserted were [employer] Reline:               Left                  Right L2  6.5x55  6.5x55 L3 6.5x55  6.5x55 L4 6.5x50  6.5x50   AP and lateral fluoroscopic images were then taken to visualize all the screws. The screws were in satisfactory position. The screws were then stimulated and none of the screws stimulated under 40.  A rongeur was used to remove the spinous processes and interspinous ligaments between the L1/2 interspinous ligament to the cranial portion of the L4 spinous process. The spinous processes, but not the interspinous ligaments or soft tissue, was saved and morselized on the back table. Bone wax was used to obtain hemostasis at the bleeding bony surfaces. A high-speed burr was used to thin the lamina at L2, L3, and the cranial portion of  L4. Above the level of the ligamentum, the lamina was thinned with the burr to the approximate level of the ligamentum. A series of Kerrison rongeurs were used to remove the remaining lamina and ligamentum overlying the thecal sac. While using the kerrison on the left side near the L3 pedicle, a durotomy occurred. It was a small linear tear. The arachnoid was preserved so no active leaking was seen until  later when further decompression was performed and a kerrison was used over that area. Next, a woodsen was used to protect the thecal sac as a 3 kerrison was used to remove the medial portions of the L2/3 and L3/4 facet joints bilaterally. Next, the woodsen was placed into the L2/3 foramen on the left side over the exiting nerve root. A kerrison was used to over the woodsen to remove the soft tissue and bone overlying the exiting nerve root. The same process was repeated for the left L3/4 foramen. A curved curette was then placed into both of those foramen over the exiting nerve roots and was used to clear further tissue over the nerve roots.   A woodsen was placed into the laminectomy site to palpate for any remaining areas of stenosis. No further areas of stenosis were felt at this time and the woodsen was able to be easily passed into the lateral gutters and into the left L2/3 and L3/4 foramen. A 5.5x70 titanium rod was placed into the pedicle screws and set screws were tightened over the rod into each pedicle screw. The same process was repeated for the contralateral side. All the set screws were final tightened. A lateral fluoroscopic film was taken with a woodsen in the laminectomy defect to demonstrate the cranial aspect of the decompression. The same was done with the woodsen caudally. Final lateral and AP images were then taken demonstrating the laminectomy defect and showing instrumentation in satisfactory position.   Attention was then turned to the durotomy. A 6-0 prolene was used to place 2 simple interrupted sutures into the dura to close the durotomy defect. A Valsalva was held for 30 seconds at and no CSF was seen leaking at the durotomy site. A duragen patch was then placed over the repair. Duraseal exact was then misted over the area.   A high speed burr was used to decorticate the facet joints, pars interarticularis, the remaining lamina, and the transverse processes at L2, L3, L4.  The locally harvested and morselized autograft and demineralized bone matrix were placed over the decorticated bone. All bony fragments that inadvertently went into the laminectomy defect were removed.  1g of vancomycin powder was placed into the wound. A medium hemovac drain was placed deep to the fascia. This drain was to gravity only. The fascia was reapproximated with 0 vicryl suture. Final neuromonitoring signals were checked and there was no change from baseline. Neuromonitoring was discontinued at this time. A subcutaneous hemovac drain was placed above the fascia. The subcutaneous fat was reapproximated with 0 vicryl suture. The deep dermal layer was reapproximated with 2-0 vicryl. The skin as closed with a 3-0 running moncryl. All counts were correct at the end of the case. The incision was dressed with steri strips and benzoine. An island dressing was placed over the wound. The patient was transferred back to a bed and brought to the post-anesthesia care unit by anesthesia staff in stable condition.  Post-operative plan: The patient will recover in the post-anesthesia care unit and then go to the floor. The patient will  remain flat overnight and gradually head of bed will be raised starting tomorrow morning. The patient will receive two post-operative doses of ancef. He will get another dose of TXA. The deep drain will remain to gravity only until it is pulled. The superficial drain will be to suction. The patient will be out of bed as tolerated with no brace eventually. The patient will work with physical therapy. The drains will be removed on the day of discharge. The patient's disposition will be determined based off how he does on the floor post-operatively.       Willia Craze, MD Orthopedic Surgeon

## 2023-07-08 NOTE — Progress Notes (Signed)
Pt req eye drops for dry eyes. Contacted on on call answering service and spoke with Dennie Bible. Provided pt name and date of birth and this nurse contact number.

## 2023-07-08 NOTE — Brief Op Note (Signed)
07/08/2023  3:42 PM  PATIENT:  James Ramsey  82 y.o. male  PRE-OPERATIVE DIAGNOSIS:  LUMBAR RADICULOPATHY, NEUROLOGIC WEAKNESS  POST-OPERATIVE DIAGNOSIS:  LUMBAR RADICULOPATHY, NEUROLOGIC WEAKNESS  PROCEDURE:  Procedure(s): L2-3, L3-4 POSTERIOR LUMBAR FUSION 2 LEVEL / LAMINECTOMY (N/A)  SURGEON:  Surgeons and Role:    London Sheer, MD - Primary  PHYSICIAN ASSISTANT: None  ASSISTANTS: none   ANESTHESIA:   general  EBL:  250 mL   BLOOD ADMINISTERED:none  DRAINS:  2 medium hemovacs in the back    LOCAL MEDICATIONS USED:  NONE  SPECIMEN:  No Specimen  DISPOSITION OF SPECIMEN:  N/A  COUNTS:  YES  TOURNIQUET: NONE  DICTATION: .Note written in EPIC  PLAN OF CARE: Admit to inpatient   PATIENT DISPOSITION:  PACU - hemodynamically stable.   Delay start of Pharmacological VTE agent (>24hrs) due to surgical blood loss or risk of bleeding: yes

## 2023-07-08 NOTE — Progress Notes (Signed)
Orthopedic Surgery Post-operative Progress Note  Assessment: Patient is a 82 y.o. male who is currently admitted after undergoing L2-4 laminectomies and PSIF complicated by durotomy s/p repair   Plan: -Operative plans complete -Bedrest and head of bed flat until tomorrow morning -Needs upright films when able -Drains to be maintained until output slows -No bending/lifting/twisting greater than 10 pounds -OT evaluate and treat once off bedrest -Pain control -Regular diet -No chemoprophylaxis for dvt or antiplatelets for 72 hours after surgery -Ancef x2 post-operative doses -Disposition: to floor from PACU  ___________________________________________________________________________   Subjective: No acute events since surgery. Recovering in PACU. Pain under control. Not having any radiating leg pain. Feels numbness in legs has improved but not resolved. He denies any headaches.   Objective:  General: no acute distress, laying flat in bed Neurologic: alert, answering questions appropriately, following commands Respiratory: unlabored breathing on nasal canula Skin: dressing clear/dry/intact, drains with sanguinous output  MSK (spine):  -Strength exam      Right  Left  EHL    5/5  3/5 TA    5/5  4/5 GSC    5/5  5/5 Knee extension  5/5  5/5 Hip flexion   5/5  5/5  -Sensory exam    Sensation intact to light touch in L3-S1 nerve distributions of bilateral lower extremities   Patient name: James Ramsey Patient MRN: [id number] Date: 07/08/23

## 2023-07-08 NOTE — Transfer of Care (Signed)
Immediate Anesthesia Transfer of Care Note  Patient: James Ramsey  Procedure(s) Performed: L2-3, L3-4 POSTERIOR LUMBAR FUSION 2 LEVEL / LAMINECTOMY  Patient Location: PACU  Anesthesia Type:General  Level of Consciousness: drowsy  Airway & Oxygen Therapy: Patient Spontanous Breathing and Patient connected to face mask oxygen  Post-op Assessment: Report given to RN and Post -op Vital signs reviewed and stable  Post vital signs: Reviewed and stable  Last Vitals:  Vitals Value Taken Time  BP 128/62 07/08/23 1548  Temp    Pulse 71 07/08/23 1549  Resp 23 07/08/23 1549  SpO2 93 % 07/08/23 1549  Vitals shown include unfiled device data.  Last Pain:  Vitals:   07/08/23 0611  TempSrc:   PainSc: 0-No pain         Complications: No notable events documented.

## 2023-07-08 NOTE — Anesthesia Procedure Notes (Signed)
Procedure Name: Intubation Date/Time: 07/08/2023 7:53 AM  Performed by: Randa Evens, CRNAPre-anesthesia Checklist: Patient identified, Emergency Drugs available, Suction available and Patient being monitored Patient Re-evaluated:Patient Re-evaluated prior to induction Oxygen Delivery Method: Circle System Utilized Preoxygenation: Pre-oxygenation with 100% oxygen Induction Type: IV induction Ventilation: Mask ventilation without difficulty Laryngoscope Size: Mac and 4 Grade View: Grade I Tube type: Oral Tube size: 7.5 mm Number of attempts: 1 Airway Equipment and Method: Stylet and Oral airway Placement Confirmation: ETT inserted through vocal cords under direct vision, positive ETCO2 and breath sounds checked- equal and bilateral Secured at: 23 cm Tube secured with: Tape Dental Injury: Teeth and Oropharynx as per pre-operative assessment

## 2023-07-08 NOTE — Anesthesia Postprocedure Evaluation (Signed)
Anesthesia Post Note  Patient: Onur Mori  Procedure(s) Performed: L2-3, L3-4 POSTERIOR LUMBAR FUSION 2 LEVEL / LAMINECTOMY     Patient location during evaluation: PACU Anesthesia Type: General Level of consciousness: awake and alert, patient cooperative and oriented Pain management: pain level controlled Vital Signs Assessment: post-procedure vital signs reviewed and stable Respiratory status: spontaneous breathing, nonlabored ventilation, respiratory function stable and patient connected to nasal cannula oxygen Cardiovascular status: blood pressure returned to baseline and stable Postop Assessment: no apparent nausea or vomiting Anesthetic complications: no   No notable events documented.  Last Vitals:  Vitals:   07/08/23 1630 07/08/23 1645  BP: 125/60 126/63  Pulse: 64 67  Resp: 12 12  Temp:    SpO2: 96% 96%    Last Pain:  Vitals:   07/08/23 1615  TempSrc:   PainSc: 0-No pain                 Vidit Boissonneault,E. Raeanna Soberanes

## 2023-07-09 ENCOUNTER — Telehealth: Payer: Self-pay | Admitting: Orthopedic Surgery

## 2023-07-09 ENCOUNTER — Inpatient Hospital Stay (HOSPITAL_COMMUNITY): Payer: Medicare PPO

## 2023-07-09 LAB — CBC
HCT: 31.8 % — ABNORMAL LOW (ref 39.0–52.0)
Hemoglobin: 11.3 g/dL — ABNORMAL LOW (ref 13.0–17.0)
MCH: 32 pg (ref 26.0–34.0)
MCHC: 35.5 g/dL (ref 30.0–36.0)
MCV: 90.1 fL (ref 80.0–100.0)
Platelets: 166 10*3/uL (ref 150–400)
RBC: 3.53 MIL/uL — ABNORMAL LOW (ref 4.22–5.81)
RDW: 12.2 % (ref 11.5–15.5)
WBC: 8.9 10*3/uL (ref 4.0–10.5)
nRBC: 0 % (ref 0.0–0.2)

## 2023-07-09 LAB — BASIC METABOLIC PANEL
Anion gap: 7 (ref 5–15)
BUN: 12 mg/dL (ref 8–23)
CO2: 25 mmol/L (ref 22–32)
Calcium: 9.2 mg/dL (ref 8.9–10.3)
Chloride: 106 mmol/L (ref 98–111)
Creatinine, Ser: 0.9 mg/dL (ref 0.61–1.24)
GFR, Estimated: >60 mL/min (ref >60)
Glucose, Bld: 115 mg/dL — ABNORMAL HIGH (ref 70–99)
Potassium: 4.7 mmol/L (ref 3.5–5.1)
Sodium: 138 mmol/L (ref 135–145)

## 2023-07-09 MED ORDER — SENNA 8.6 MG PO TABS: 1.0000 | ORAL_TABLET | Freq: Two times a day (BID) | ORAL | 0 refills | Status: AC

## 2023-07-09 MED ORDER — METHOCARBAMOL 500 MG PO TABS: 500.0000 mg | ORAL_TABLET | Freq: Four times a day (QID) | ORAL | 0 refills | Status: AC

## 2023-07-09 MED ORDER — POLYETHYLENE GLYCOL 3350 17 G PO PACK: 17.0000 g | PACK | Freq: Every day | ORAL | 0 refills | Status: AC

## 2023-07-09 MED ORDER — ACETAMINOPHEN 500 MG PO TABS: 1000.0000 mg | ORAL_TABLET | Freq: Three times a day (TID) | ORAL | 0 refills | Status: AC

## 2023-07-09 MED ORDER — OXYCODONE HCL 5 MG PO TABS: 2.5000 mg | ORAL_TABLET | ORAL | 0 refills | Status: AC | PRN

## 2023-07-09 MED FILL — Thrombin For Soln Kit 20000 Unit: CUTANEOUS | Qty: 1 | Status: AC

## 2023-07-09 NOTE — Progress Notes (Addendum)
Orthopedic Surgery Post-operative Progress Note  Assessment: Patient is a 82 y.o. male who is currently admitted after undergoing L2-4 laminectomies and PSIF complicated by durotomy s/p repair   Plan: -Operative plans complete -Out of bed as tolerated, no brace -Needs upright films when able -Drains will be removed prior to discharge -No bending/lifting/twisting greater than 10 pounds -OT evaluate and treat -Pain control -Regular diet -No chemoprophylaxis for dvt or antiplatelets for 72 hours after surgery -Ancef x2 post-operative doses -Disposition: remain floor status, possible discharge this evening if still doing well  Acute anemia from surgical blood loss -hgb of 11.3 from 13.9 pre-op -no signs/symptoms of anemia and no further significant blood loss expected so will stop lab monitoring at this time  ___________________________________________________________________________   Subjective: No acute events overnight. Feels sore in his back. Pain is well controlled. No radiating leg pain. Did not have any headaches overnight.   On walking the halls this morning, no headaches either.   Objective:  General: no acute distress, laying in bed Neurologic: alert, answering questions appropriately, following commands Respiratory: unlabored breathing on room air Skin: dressing clear/dry/intact, drains with minimal serosanguinous output  Was able to walk the halls with me without assistive devices this morning  MSK (spine):  -Strength exam      Right  Left  EHL    5/5  4/5 TA    5/5  4/5 GSC    5/5  5/5 Knee extension  5/5  5/5 Hip flexion   5/5  5/5  -Sensory exam    Sensation intact to light touch in L3-S1 nerve distributions of bilateral lower extremities   Patient name: James Ramsey Patient MRN: [id number] Date: 07/09/23

## 2023-07-09 NOTE — Discharge Summary (Addendum)
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 male who was admitted after undergoing L2-4 laminectomy with posterior instrumented spinal fusion. His surgery was complicated by a durotomy that was repaired intra-operatively. He was laid flat overnight after surgery. In the morning, he was ambulated and did not have any headaches. There was no clear drainage in his deep drain. The patient had significant pain immediately after surgery, but pain eventually was controlled with a multimodal regimen including oxycodone. Labs during the hospitalization revealed anemia from surgical blood loss, but did not require any intervention as his hemoglobin was in the 11s and no further significant blood loss was expected. The patient worked with physical therapy and occupational therapy who recommended discharge to home. The patient was tolerating an oral diet without issue and was voiding spontaneously after surgery. The patient's vitals were stable on the day of discharge. The patient's drains were removed during the evening on the day of discharge. The patient did not have any headaches during the post-operative period. The patient was medically ready for discharge and was discharge to home on post-operative day one.  Instructions:   Orthopedic Surgery Discharge Instructions  Patient name: James Ramsey Procedure Performed: L2-4 laminectomy and posterior instrumented spinal fusion Date of Surgery: 07/08/2023 Surgeon: Willia Craze, MD  Pre-operative Diagnosis: lumbar stenosis, lumbar radiculopathy Post-operative Diagnosis:  same as above  Discharged to: home Discharge Condition: stable  Activity: You should refrain from bending, lifting, or twisting with objects greater than ten pounds until three months after surgery. You are encouraged to walk as much as desired. You can perform household activities such as cleaning dishes, doing laundry, vacuuming, etc. as long as the ten-pound restriction is followed. You do not need to wear a brace during the post-operative period.   Incision Care: Your incision site has a dressing over it. That dressing should remain in place and dry at all times for a total of one week after surgery. You can shower as long as the dressing dose not get wet. After one week, you can remove the dressing. Underneath the dressing, you will find pieces of tape. You should leave these pieces of tape in place. They will fall off with time. Do not pick, rub, or scrub at them. Do not put cream or lotion over the surgical area. After one week and once the dressing is off, it is okay to let soap and water run over your incision. Again, do not pick, scrub, or rub at the pieces of tape when bathing. Do not submerge (e.g., take a bath, swim, go in a hot tub, etc.) until six weeks after surgery. There may be some bloody drainage from the incision into the dressing after surgery. This is normal. You do not need to replace the dressing. Continue to leave it in place for the one week as instructed above. Should the dressing become saturated with blood or drainage, please call the office for further instructions.   Medications: You have been prescribed oxycodone. This is a narcotic pain medication and should only be taken as prescribed. You should not drink alcohol or operate heavy machinery (including driving) while taking this medication. The oxycodone can cause constipation as a side effect. For that reason,  you have been prescribed senna and miralax. These are both laxatives. You do not need to take this medication if  you develop diarrhea. Should you remain constipated even while taking these medications, please increase the dose of miralax to twice daily. Tylenol has been prescribed to be taken every 8 hours, which will give you additional pain relief. Robaxin is a muscle relaxer that has been prescribed to you for muscle spasm type pain. Take this medication as needed.   Do not take NSAIDs (ibuprofen, Aleve, Celebrex, naproxen, meloxicam, etc.) for the first 6 weeks after surgery as there is some evidence that their use may decrease the chances of successful fusion.   In order to set expectations for opioid prescriptions, you will only be prescribed opioids for a total of six weeks after surgery and, at two-weeks after surgery, your opioid prescription will start to tapered (decreased dosage and number of pills). If you have ongoing need for opioid medication six weeks after surgery, you will be referred to pain management. If you are already established with a provider that is giving you opioid medications, you should schedule an appointment with them for six weeks after surgery if you feel you are going to need another prescription. State law only allows for opioid prescriptions one week at a time. If you are running out of opioid medication near the end of the week, please call the office during business hours before running out so I can send you another prescription.   You may resume any home blood thinners (warfarin, lovenox, apixaban, plavix, xarelto, etc) 72 hours after your surgery. Take these medications as they were previously prescribed.  Driving: You should not drive while taking narcotic pain medications. You should start getting back to driving slowly and you may want to try driving in a parking lot before doing anything more.   Diet: You are safe to resume your regular diet after surgery.   Reasons to Call the Office After Surgery: You should feel free to call the office with any concerns or questions  you have in the post-operative period, but you should definitely notify the office if you develop: -shortness of breath, chest pain, or trouble breathing -headaches that develop when you are standing or sitting and resolve when laying down -excessive bleeding, drainage, redness, or swelling around the surgical site -fevers, chills, or pain that is getting worse with each passing day -persistent nausea or vomiting -new weakness in either leg -new or worsening numbness or tingling in either leg -numbness in the groin, bowel or bladder incontinence -other concerns about your surgery  Follow Up Appointments: You should have an office appointment scheduled for approximately two weeks after surgery. If you do not remember when this appointment is or do not already have it scheduled, please call the office to schedule.   Office Information:  -Willia Craze, MD -Phone number: [phone] -Address: [address]       [place], [place] [address]

## 2023-07-09 NOTE — Evaluation (Signed)
Physical Therapy Evaluation Patient Details Name: James Ramsey MRN: [id number] DOB: [birth year] Today's Date: 07/09/2023  History of Present Illness  Pt is an 82 y/o M p/p L2-3, L3-4 PLIF on 11/5. PMH includes stroke, A fib, prostate CA, HTN  Clinical Impression  Pt presents to PT with deficits in LLE strength/power, endurance, balance, gait. Pt is able to ambulate for limited community distances and negotiate a flight of stairs. Pt does demonstrates continued L dorsiflexor weakness, leading to intermittent foot drag. Pt expresses good knowledge of back precautions. Pt is encouraged to mobilize frequently. PT recommends discharge home when medically appropriate.        If plan is discharge home, recommend the following: Assistance with cooking/housework;Assist for transportation   Can travel by private vehicle        Equipment Recommendations None recommended by PT  Recommendations for Other Services       Functional Status Assessment Patient has had a recent decline in their functional status and demonstrates the ability to make significant improvements in function in a reasonable and predictable amount of time.     Precautions / Restrictions Precautions Precautions: Back Precaution Booklet Issued: Yes (comment) Precaution Comments: pt able to recall back precautions Required Braces or Orthoses:  (no brace needed) Restrictions Weight Bearing Restrictions: No      Mobility  Bed Mobility Overal bed mobility: Needs Assistance Bed Mobility: Rolling, Sidelying to Sit, Sit to Sidelying Rolling: Supervision Sidelying to sit: Supervision     Sit to sidelying: Contact guard assist      Transfers Overall transfer level: Needs assistance Equipment used: None Transfers: Sit to/from Stand Sit to Stand: Supervision                Ambulation/Gait Ambulation/Gait assistance: Supervision Gait Distance (Feet): 300 Feet Assistive device: None Gait  Pattern/deviations: Step-through pattern, Decreased dorsiflexion - left Gait velocity: functional Gait velocity interpretation: 1.31 - 2.62 ft/sec, indicative of limited community ambulator   General Gait Details: pt with reduced DF through LLE, inconsistent L foot clearance  Stairs Stairs: Yes Stairs assistance: Supervision Stair Management: One rail Right, Alternating pattern Number of Stairs: 10    Wheelchair Mobility     Tilt Bed    Modified Rankin (Stroke Patients Only)       Balance Overall balance assessment: Needs assistance Sitting-balance support: No upper extremity supported, Feet supported Sitting balance-Leahy Scale: Good     Standing balance support: No upper extremity supported, During functional activity Standing balance-Leahy Scale: Good                               Pertinent Vitals/Pain Pain Assessment Pain Assessment: No/denies pain    Home Living Family/patient expects to be discharged to:: Private residence Living Arrangements: Spouse/significant other Available Help at Discharge: Family;Available 24 hours/day Type of Home: House Home Access: Stairs to enter Entrance Stairs-Rails: Can reach both Entrance Stairs-Number of Steps: 4   Home Layout: One level Home Equipment: Shower seat;Cane - single point      Prior Function Prior Level of Function : Independent/Modified Independent             Mobility Comments: ambulatory with SPC, driving ADLs Comments: ind     Extremity/Trunk Assessment   Upper Extremity Assessment Upper Extremity Assessment: Overall WFL for tasks assessed    Lower Extremity Assessment Lower Extremity Assessment: LLE deficits/detail LLE Deficits / Details: pt grossly 4/5 LLE, 4-/5 DF, dorsiflexors fatigue  with mobility    Cervical / Trunk Assessment Cervical / Trunk Assessment: Back Surgery  Communication   Communication Communication: No apparent difficulties Cueing Techniques: Verbal  cues  Cognition Arousal: Alert Behavior During Therapy: WFL for tasks assessed/performed Overall Cognitive Status: Within Functional Limits for tasks assessed                                          General Comments General comments (skin integrity, edema, etc.): VSS, bilateral hemovacs, R hemovac leaking from tubing pre-mobility however RN is aware    Exercises     Assessment/Plan    PT Assessment Patient needs continued PT services  PT Problem List Decreased strength;Decreased activity tolerance;Decreased balance;Decreased mobility       PT Treatment Interventions DME instruction;Gait training;Stair training;Functional mobility training;Therapeutic activities;Balance training;Neuromuscular re-education;Therapeutic exercise;Patient/family education    PT Goals (Current goals can be found in the Care Plan section)  Acute Rehab PT Goals Patient Stated Goal: to return to exercise PT Goal Formulation: With patient Time For Goal Achievement: 07/14/23 Potential to Achieve Goals: Good Additional Goals Additional Goal #1: Pt will score >19/24 on the DGI to indicate a reduced risk for falls    Frequency Min 1X/week     Co-evaluation               AM-PAC PT "6 Clicks" Mobility  Outcome Measure Help needed turning from your back to your side while in a flat bed without using bedrails?: A Little Help needed moving from lying on your back to sitting on the side of a flat bed without using bedrails?: A Little Help needed moving to and from a bed to a chair (including a wheelchair)?: A Little Help needed standing up from a chair using your arms (e.g., wheelchair or bedside chair)?: A Little Help needed to walk in hospital room?: A Little Help needed climbing 3-5 steps with a railing? : A Little 6 Click Score: 18    End of Session   Activity Tolerance: Patient tolerated treatment well Patient left: in bed;with call bell/phone within reach Nurse  Communication: Mobility status PT Visit Diagnosis: Other abnormalities of gait and mobility (R26.89);Muscle weakness (generalized) (M62.81)    Time: 1610-9604 PT Time Calculation (min) (ACUTE ONLY): 21 min   Charges:   PT Evaluation $PT Eval Low Complexity: 1 Low   PT General Charges $$ ACUTE PT VISIT: 1 Visit         Arlyss Gandy, PT, DPT Acute Rehabilitation Office [phone]   Arlyss Gandy 07/09/2023, 3:31 PM

## 2023-07-09 NOTE — Discharge Instructions (Addendum)
Orthopedic Surgery Discharge Instructions  Patient name: James Ramsey Procedure Performed: L2-4 laminectomy and posterior instrumented spinal fusion Date of Surgery: 07/08/2023 Surgeon: Willia Craze, MD  Pre-operative Diagnosis: lumbar stenosis, lumbar radiculopathy Post-operative Diagnosis: same as above  Discharged to: home Discharge Condition: stable  Activity: You should refrain from bending, lifting, or twisting with objects greater than ten pounds until three months after surgery. You are encouraged to walk as much as desired. You can perform household activities such as cleaning dishes, doing laundry, vacuuming, etc. as long as the ten-pound restriction is followed. You do not need to wear a brace during the post-operative period.   Incision Care: Your incision site has a dressing over it. That dressing should remain in place and dry at all times for a total of one week after surgery. You can shower as long as the dressing dose not get wet. After one week, you can remove the dressing. Underneath the dressing, you will find pieces of tape. You should leave these pieces of tape in place. They will fall off with time. Do not pick, rub, or scrub at them. Do not put cream or lotion over the surgical area. After one week and once the dressing is off, it is okay to let soap and water run over your incision. Again, do not pick, scrub, or rub at the pieces of tape when bathing. Do not submerge (e.g., take a bath, swim, go in a hot tub, etc.) until six weeks after surgery. There may be some bloody drainage from the incision into the dressing after surgery. This is normal. You do not need to replace the dressing. Continue to leave it in place for the one week as instructed above. Should the dressing become saturated with blood or drainage, please call the office for further instructions.   Medications: You have been prescribed oxycodone. This is a narcotic pain medication and should only be  taken as prescribed. You should not drink alcohol or operate heavy machinery (including driving) while taking this medication. The oxycodone can cause constipation as a side effect. For that reason, you have been prescribed senna and miralax. These are both laxatives. You do not need to take this medication if you develop diarrhea. Should you remain constipated even while taking these medications, please increase the dose of miralax to twice daily. Tylenol has been prescribed to be taken every 8 hours, which will give you additional pain relief. Robaxin is a muscle relaxer that has been prescribed to you for muscle spasm type pain. Take this medication as needed.   Do not take NSAIDs (ibuprofen, Aleve, Celebrex, naproxen, meloxicam, etc.) for the first 6 weeks after surgery as there is some evidence that their use may decrease the chances of successful fusion.   In order to set expectations for opioid prescriptions, you will only be prescribed opioids for a total of six weeks after surgery and, at two-weeks after surgery, your opioid prescription will start to tapered (decreased dosage and number of pills). If you have ongoing need for opioid medication six weeks after surgery, you will be referred to pain management. If you are already established with a provider that is giving you opioid medications, you should schedule an appointment with them for six weeks after surgery if you feel you are going to need another prescription. State law only allows for opioid prescriptions one week at a time. If you are running out of opioid medication near the end of the week, please call the office during  business hours before running out so I can send you another prescription.   You may resume any home blood thinners (warfarin, lovenox, apixaban, plavix, xarelto, etc) 72 hours after your surgery. Take these medications as they were previously prescribed.  Driving: You should not drive while taking narcotic pain  medications. You should start getting back to driving slowly and you may want to try driving in a parking lot before doing anything more.   Diet: You are safe to resume your regular diet after surgery.   Reasons to Call the Office After Surgery: You should feel free to call the office with any concerns or questions you have in the post-operative period, but you should definitely notify the office if you develop: -shortness of breath, chest pain, or trouble breathing -headaches that develop when you are standing or sitting and resolve when laying down -excessive bleeding, drainage, redness, or swelling around the surgical site -fevers, chills, or pain that is getting worse with each passing day -persistent nausea or vomiting -new weakness in either leg -new or worsening numbness or tingling in either leg -numbness in the groin, bowel or bladder incontinence -other concerns about your surgery  Follow Up Appointments: You should have an office appointment scheduled for approximately two weeks after surgery. If you do not remember when this appointment is or do not already have it scheduled, please call the office to schedule.   Office Information:  -Willia Craze, MD -Phone number: [phone] -Address: [address]       [place], [place] [address]

## 2023-07-09 NOTE — Evaluation (Addendum)
Occupational Therapy Evaluation Patient Details Name: James Ramsey MRN: [id number] DOB: 01-29-1941 Today's Date: 07/09/2023   History of Present Illness Pt is an 82 y/o M p/p L2-3, L3-4 PLIF on 11/5. PMH includes stroke, A fib, prostate CA, HTN   Clinical Impression   Pt reports ind at baseline with ADLs and was using [facility] for mobility, lives with spouse who can assist at d/c. Pt needing set up - min A for ADLs, CGA for bed mobility and CGA for transfers without AD. 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 Pt educated on back precautions and compensatory strategies for ADLs (handout provided). Pt verbalized understanding. Pt presenting with impairments listed below, will follow acutely. Anticipate no OT follow up needs at d/c.       If plan is discharge home, recommend the following: A little help with walking and/or transfers;A little help with bathing/dressing/bathroom;Assistance with cooking/housework;Assist for transportation;Help with stairs or ramp for entrance    Functional Status Assessment  Patient has had a recent decline in their functional status and demonstrates the ability to make significant improvements in function in a reasonable and predictable amount of time.  Equipment Recommendations  None recommended by OT    Recommendations for Other Services PT consult     Precautions / Restrictions Precautions Precautions: Back Precaution Booklet Issued: Yes (comment) Precaution Comments: educated pt on 3/3 back prec Required Braces or Orthoses: Other Brace Other Brace: no brace needed per MD Restrictions Weight Bearing Restrictions: No      Mobility Bed Mobility Overal bed mobility: Needs Assistance Bed Mobility: Rolling, Sidelying to Sit Rolling: Contact guard assist Sidelying to sit: Contact guard assist            Transfers Overall transfer level: Needs  assistance Equipment used: None Transfers: Sit to/from Stand Sit to Stand: Contact guard assist                  Balance Overall balance assessment: Needs assistance Sitting-balance support: Feet supported Sitting balance-Leahy Scale: Good     Standing balance support: During functional activity Standing balance-Leahy Scale: Fair Standing balance comment: mild LOB due to 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                           ADL either performed or assessed with clinical judgement   ADL Overall ADL's : Needs assistance/impaired Eating/Feeding: Set up   Grooming: Set up;Standing   Upper Body Bathing: Contact guard assist;Sitting   Lower Body Bathing: Minimal assistance   Upper Body Dressing : Contact guard assist;Sitting   Lower Body Dressing: Minimal assistance;Sitting/lateral leans   Toilet Transfer: Contact guard assist;Ambulation;Rolling walker (2 wheels)   Toileting- Clothing Manipulation and Hygiene: Contact guard assist       Functional mobility during ADLs: Contact guard assist;Rolling walker (2 wheels)       Vision Baseline Vision/History: 0 No visual deficits Vision Assessment?: No apparent visual deficits     Perception Perception: Not tested       Praxis Praxis: Not tested       Pertinent Vitals/Pain Pain Assessment Pain Assessment: No/denies pain     Extremity/Trunk Assessment Upper Extremity Assessment Upper Extremity Assessment: Overall WFL for tasks assessed   Lower Extremity Assessment Lower Extremity Assessment: Defer to PT evaluation   Cervical / Trunk Assessment Cervical / Trunk Assessment: Back Surgery   Communication Communication  Communication: No apparent difficulties   Cognition Arousal: Alert Behavior During Therapy: WFL for tasks assessed/performed Overall Cognitive Status: Within Functional Limits for tasks assessed                                       General Comments  VSS, bil hemovac  drains intact    Exercises     Shoulder Instructions      Home Living Family/patient expects to be discharged to:: Private residence Living Arrangements: Spouse/significant other Available Help at Discharge: Family;Available 24 hours/day Type of Home: House Home Access: Stairs to enter [employer] of Steps: 4   Home Layout: One level     Bathroom Shower/Tub: Producer, television/film/video: Standard     Home Equipment: Production assistant, radio - single point          Prior Functioning/Environment Prior Level of Function : Needs assist;Driving             Mobility Comments: use of SPC for mobility ADLs Comments: ind        OT Problem List: Decreased strength;Decreased range of motion;Decreased activity tolerance;Impaired balance (sitting and/or standing);Decreased knowledge of precautions      OT Treatment/Interventions: Self-care/ADL training;Therapeutic exercise;Energy conservation;DME and/or AE instruction;Therapeutic activities;Patient/family education;Balance training    OT Goals(Current goals can be found in the care plan section) Acute Rehab OT Goals Patient Stated Goal: none stated OT Goal Formulation: With patient Time For Goal Achievement: 07/23/23 Potential to Achieve Goals: Good ADL Goals Pt Will Perform Upper Body Dressing: Independently Pt Will Perform Lower Body Dressing: Independently Pt Will Transfer to Toilet: Independently Pt Will Perform Tub/Shower Transfer: Tub transfer;Shower transfer;Independently  OT Frequency: Min 1X/week    Co-evaluation              AM-PAC OT "6 Clicks" Daily Activity     Outcome Measure Help from another person eating meals?: None Help from another person taking care of personal grooming?: A Little Help from another person toileting, which includes using toliet, bedpan, or urinal?: A Little Help from another person bathing (including washing, rinsing, drying)?: A Little Help from another person to  put on and taking off regular upper body clothing?: A Little Help from another person to put on and taking off regular lower body clothing?: A Little 6 Click Score: 19   End of Session Nurse Communication: Mobility status  Activity Tolerance: Patient tolerated treatment well Patient left: in chair;with call bell/phone within reach  OT Visit Diagnosis: Unsteadiness on feet (R26.81);Other abnormalities of gait and mobility (R26.89);Muscle weakness (generalized) (M62.81)                Time: 8119-1478 OT Time Calculation (min): 33 min Charges:  OT General Charges $OT Visit: 1 Visit OT Evaluation $OT Eval Low Complexity: 1 Low OT Treatments $Self Care/Home Management : 8-22 mins  Carver Fila, OTD, OTR/L SecureChat Preferred Acute Rehab [phone]   Carver Fila Koonce 07/09/2023, 11:52 AM

## 2023-07-09 NOTE — Telephone Encounter (Signed)
Chelsea from [facility] called in stating pt has low blood pressure please advise CB# [phone]

## 2023-07-09 NOTE — Progress Notes (Signed)
Patient is being discharged home. Discharge instructions reviewed with patient including new medications and post op instructions. Patient and his wife verbalized a full understanding of all instructions. Patients wife is his ride home.

## 2023-07-09 NOTE — Plan of Care (Signed)
  Problem: Education: Goal: Knowledge of General Education information will improve Description: Including pain rating scale, medication(s)/side effects and non-pharmacologic comfort measures Outcome: Progressing   Problem: Health Behavior/Discharge Planning: Goal: Ability to manage health-related needs will improve Outcome: Progressing   Problem: Clinical Measurements: Goal: Ability to maintain clinical measurements within normal limits will improve Outcome: Progressing   Problem: Activity: Goal: Risk for activity intolerance will decrease Outcome: Progressing   Problem: Nutrition: Goal: Adequate nutrition will be maintained Outcome: Progressing   Problem: Elimination: Goal: Will not experience complications related to bowel motility Outcome: Progressing Goal: Will not experience complications related to urinary retention Outcome: Progressing   Problem: Pain Management: Goal: General experience of comfort will improve Outcome: Progressing

## 2023-07-14 ENCOUNTER — Encounter: Payer: Self-pay | Admitting: Orthopedic Surgery

## 2023-07-14 DIAGNOSIS — R350 Frequency of micturition: Secondary | ICD-10-CM

## 2023-07-15 ENCOUNTER — Telehealth: Payer: Self-pay | Admitting: Orthopedic Surgery

## 2023-07-16 ENCOUNTER — Other Ambulatory Visit: Payer: Self-pay | Admitting: Radiology

## 2023-07-16 ENCOUNTER — Telehealth: Payer: Self-pay | Admitting: Orthopedic Surgery

## 2023-07-16 DIAGNOSIS — R350 Frequency of micturition: Secondary | ICD-10-CM

## 2023-07-16 NOTE — Telephone Encounter (Signed)
Pt called requesting a call from [facility]. Pt states had a chart message from Dr Christell Constant that a UTI test would be sent to [facility]. Pt just showed up to [facility] and they state they had no order from [facility] for him. Please call pt when sent to [facility]. Pt phone number is [phone].

## 2023-07-16 NOTE — Telephone Encounter (Signed)
New order placed, print request, I called and advised patient that I would put it at the front desk downstairs for him to pick up

## 2023-07-17 LAB — URINALYSIS, ROUTINE W REFLEX MICROSCOPIC
Bilirubin Urine: NEGATIVE
Glucose, UA: NEGATIVE
Hgb urine dipstick: NEGATIVE
Ketones, ur: NEGATIVE
Leukocytes,Ua: NEGATIVE
Nitrite: NEGATIVE
Protein, ur: NEGATIVE
Specific Gravity, Urine: 1.01 (ref 1.001–1.035)
pH: 6.5 (ref 5.0–8.0)

## 2023-07-18 NOTE — Progress Notes (Signed)
I called and advised pt of his results

## 2023-07-21 ENCOUNTER — Encounter: Payer: Medicare PPO | Admitting: Orthopedic Surgery

## 2023-07-22 ENCOUNTER — Ambulatory Visit (INDEPENDENT_AMBULATORY_CARE_PROVIDER_SITE_OTHER): Payer: Medicare PPO | Admitting: Orthopedic Surgery

## 2023-07-22 ENCOUNTER — Other Ambulatory Visit (INDEPENDENT_AMBULATORY_CARE_PROVIDER_SITE_OTHER): Payer: Medicare PPO

## 2023-07-22 DIAGNOSIS — E7800 Pure hypercholesterolemia, unspecified: Secondary | ICD-10-CM | POA: Diagnosis not present

## 2023-07-22 DIAGNOSIS — Z6828 Body mass index (BMI) 28.0-28.9, adult: Secondary | ICD-10-CM | POA: Diagnosis not present

## 2023-07-22 DIAGNOSIS — Z981 Arthrodesis status: Secondary | ICD-10-CM

## 2023-07-22 DIAGNOSIS — I10 Essential (primary) hypertension: Secondary | ICD-10-CM | POA: Diagnosis not present

## 2023-07-22 DIAGNOSIS — Z7901 Long term (current) use of anticoagulants: Secondary | ICD-10-CM | POA: Diagnosis not present

## 2023-07-22 DIAGNOSIS — Z8673 Personal history of transient ischemic attack (TIA), and cerebral infarction without residual deficits: Secondary | ICD-10-CM | POA: Diagnosis not present

## 2023-07-22 DIAGNOSIS — I480 Paroxysmal atrial fibrillation: Secondary | ICD-10-CM | POA: Diagnosis not present

## 2023-07-22 LAB — LAB REPORT - SCANNED: EGFR: 88

## 2023-07-22 NOTE — Progress Notes (Addendum)
Orthopedic Surgery Post-operative Office Visit  Procedure: L2-4 laminectomies, L2-4 PSIF Date of Surgery: 07/08/2023 (~2 weeks post-op)  Assessment: Patient is a 82 y.o. who is doing well after surgery   Plan: -Operative plans complete -Out of bed as tolerated, no brace -No bending/lifting/twisting greater than 10 pounds -Okay to let soap/water run over the incision but do not submerge -Pain management: OTC medications -Has had some increased urinary frequency particularly at night. UA was negative. I told him it could have been from Foley placement or being under GA. I told him to monitor for improvement. If it does not improve, he is going to his urologist since he has a history of prostate cancer -Referral provided to PT today. Encouraged him to work on home exercises for lower extremity strengthening -Return to office in 4 weeks, x-rays needed at next visit: AP/lateral lumbar  ___________________________________________________________________________   Subjective: Patient has been at home since surgery. He is not having any back or radiating leg pain. He feels slightly weaker with left foot dorsiflexion which started this morning. He feels that overall his walking is gradually improving. He is not taking any pain medications. Has not noticed any redness or drainage around his incision. He is not having any headaches.   One issue he has had post-op is increased urinary frequency at night. He said initially he was going every hour. He stopped the muscle relaxer and it spaced out to every 3-4hrs. Now he is having to go every 2-3 hours at night and it is disrupting his sleep. He had a UA post-operatively that did not show evidence of UTI. He has control over his bladder and has not had any incontinence. No saddle anesthesia. He is going every 3-4hrs during the day which was his normal pre-operatively. No change in bowel habits.    Objective:  General: no acute distress, appropriate  affect Neurologic: alert, answering questions appropriately, following commands Respiratory: unlabored breathing on room air Skin: incision is well approximated with no erythema, induration, active/expressible drainage  MSK (spine):  -Strength exam      Left  Right  EHL    4/5  5/5 TA    4/5  5/5 GSC    5/5  5/5 Knee extension  5/5  5/5 Hip flexion   5/5  5/5  -Sensory exam    Sensation intact to light touch in L3-S1 nerve distributions of bilateral lower extremities  Imaging: X-rays of the lumbar spine taken 07/22/2023 were independently reviewed and interpreted, showing instrumentation from L2-L4 in appropriate position. There is no lucency around the screws. Screws have not backed out. Spondylolisthesis at L3/4 appears in similar position to pre-operative films. No fracture or dislocation seen.    Patient name: James Ramsey Patient MRN: [id number] Date of visit: 07/22/23

## 2023-07-23 NOTE — Therapy (Signed)
OUTPATIENT PHYSICAL THERAPY EVALUATION   Patient Name: James Ramsey MRN: [id number] DOB:Jan 06, 1941, 82 y.o., male Today's Date: 07/24/2023  END OF SESSION:  PT End of Session - 07/24/23 1404     Visit Number 1    Number of Visits 24    Date for PT Re-Evaluation 10/16/23    Authorization Type [employer] $20 copay    Progress Note Due on Visit 10    PT Start Time 1425    PT Stop Time 1456    PT Time Calculation (min) 31 min    Activity Tolerance Patient tolerated treatment well    Behavior During Therapy WFL for tasks assessed/performed             Past Medical History:  Diagnosis Date   Arthritis    left hip   Cancer (HCC)    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, and also treated for prostate cancer   Dysrhythmia    Hypertension    Paroxysmal atrial fibrillation (HCC) 10/29/2020   Stroke (cerebrum) [facility])    Past Surgical History:  Procedure Laterality Date   CARPAL TUNNEL RELEASE Right 11/26/2021   Procedure: RIGHT CARPAL TUNNEL RELEASE;  Surgeon: Marlyne Beards, MD;  Location: [facility];  Service: Orthopedics;  Laterality: Right;   GOLD SEED IMPLANT N/A 07/30/2022   Procedure: GOLD SEED IMPLANT;  Surgeon: Noel Christmas, MD;  Location: [facility];  Service: Urology;  Laterality: N/A;   PROSTATE BIOPSY N/A 04/24/2022   Procedure: BIOPSY TRANSRECTAL ULTRASONIC PROSTATE (TUBP);  Surgeon: Rene Paci, MD;  Location: [facility];  Service: Urology;  Laterality: N/A;  ONLY NEEDS 60 MIN FOR BOTH   SPACE OAR INSTILLATION N/A 07/30/2022   Procedure: SPACE OAR INSTILLATION;  Surgeon: Noel Christmas, MD;  Location: [facility];  Service: Urology;  Laterality: N/A;   TONSILLECTOMY     and adenioids removed as a child   TRANSURETHRAL RESECTION OF BLADDER TUMOR N/A 04/24/2022   Procedure: POSSIBLE TRANSURETHRAL RESECTION OF BLADDER TUMOR (TURBT);  Surgeon: Rene Paci, MD;  Location: [facility];  Service: Urology;  Laterality: N/A;   Patient Active Problem List   Diagnosis Date Noted   Lumbar radiculopathy 07/08/2023   Weakness of left lower extremity 07/08/2023   Malignant neoplasm of prostate (HCC) 05/22/2022   Carpal tunnel syndrome, right upper limb    Paroxysmal atrial fibrillation (HCC) 11/12/2021   Hypercholesterolemia 11/12/2021   Long term (current) use of anticoagulants 11/12/2021   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  Pt indicated being pleased with the surgery and seemed like every day getting better.  Pt indicated prior to the surgery having complaints of pain but an injection helped prior to having the surgery on lumbar.  Pt indicate having some numbness in instep of both feet and noting some on shin in Lt and a little on Rt.   Pt indicated having some concerns of control/stability in ambulation.  SPC for use outside of house and occasionally in house.     Pt indicated previously active with walking program and routine of 35-45 mins of aerobic class.   PERTINENT HISTORY:  Med Hx:  Arthritis, history of cancer(prostate, bladder, carcinoma), HTN, history of stroke  PAIN:  NPRS scale: at worst 4/10 Pain location: bilateral instep/arch of feet, anterior/medial Lt  and Rt.   Pain description: numbness, constant Aggravating factors: nothing specific Relieving factors: nothing specific   PRECAUTIONS: Back - be aware of lumbar fusion.  History of cancer/stroke  WEIGHT BEARING RESTRICTIONS: No  FALLS:  Has patient fallen in last 6 months? No  LIVING ENVIRONMENT: Lives in: House/apartment Stairs: has stairs 4  with rail to enter house.   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  Travel with hiking in past.   PATIENT GOALS:  Want to be able to walk for exercise (2 miles), exercise routine.    OBJECTIVE:   PATIENT SURVEYS:  07/24/2023 FOTO eval:  56  predicted:  71  SCREENING FOR RED FLAGS: 07/24/2023 Bowel or bladder incontinence: No Cauda equina syndrome: No  COGNITION: 07/24/2023 Overall cognitive status: WFL normal      SENSATION: 07/24/2023 Some decrease in L3-L4-L5 on Lt compared to right.   MUSCLE LENGTH: 07/24/2023 No specific testing today  POSTURE:  07/24/2023 unremarkable  PALPATION: 07/24/2023 No specific testing  LUMBAR ROM:   AROM 07/24/2023  Flexion   Extension 50 %   Right lateral flexion To mid thigh  Left lateral flexion To mid thigh  Right rotation   Left rotation    (Blank rows = not tested)  LOWER EXTREMITY ROM:      Right 07/24/2023 Left 07/24/2023  Hip flexion    Hip extension    Hip abduction    Hip adduction    Hip internal rotation    Hip external rotation    Knee flexion    Knee extension    Ankle dorsiflexion    Ankle plantarflexion    Ankle inversion    Ankle eversion     (Blank rows = not tested)  LOWER EXTREMITY MMT:    MMT Right 07/24/2023 Left 07/24/2023  Hip flexion 5/5 5/5  Hip extension    Hip abduction    Hip adduction    Hip internal rotation    Hip external rotation    Knee flexion 5/5 5/5  Knee extension 5/5 5/5  Ankle dorsiflexion 5/5 3+/5  Ankle plantarflexion    Ankle inversion 5/5 4/5  Ankle eversion 5/5  5/5   (Blank rows = not tested)  LUMBAR SPECIAL TESTS:  07/24/2023 No specific testing  FUNCTIONAL TESTS:  07/24/2023 18 inch chair transfer:  able unassisted from UE on 1st try Tandem stance attempts < 10 seconds bilateral prior to loss of balance.   GAIT: 07/24/2023 SPC noted in Rt UE.  Difficulty in Lt foot clearance noted.  TODAY'S TREATMENT:                                                                                                         DATE: 07/24/2023  Therex:    HEP instruction/performance c cues for techniques, handout provided.  Trial set performed of each for comprehension and symptom assessment.  See below for exercise list  PATIENT EDUCATION:  07/24/2023 Education details: HEP, POC Person educated: Patient Education method: Explanation, Demonstration, Verbal cues, and Handouts Education comprehension: verbalized understanding, returned demonstration, and verbal cues required  HOME EXERCISE PROGRAM: Access Code: [facility] URL: [email or web address] Date: 07/24/2023 Prepared by: Chyrel Masson  Exercises - Supine Piriformis Stretch with Foot on Ground  - 1-2 x daily - 7 x weekly - 1 sets - 5 reps - 30 hold - Supine Bridge with Resistance Band  - 1-2 x daily - 7 x weekly - 1-2 sets - 10 reps - Hooklying Isometric Clamshell  - 1-2 x daily - 7 x weekly - 2-3 sets - 10-15 reps - Seated Abdominal Press into [employer]  - 1-2 x daily - 7 x weekly - 1 sets - 10 reps - 5-10 hold - Seated Multifidi Isometric  - 1-2 x daily - 7 x weekly - 1 sets - 10 reps - 5-10 hold - Seated Ankle Dorsiflexion with Resistance (Mirrored)  - 1-2 x daily - 7 x weekly - 1-2 sets - 10-15 reps - Tandem Stance in Corner  - 1 x daily - 7 x weekly - 1 sets - 3-5 reps - 30-60  hold - Heel Toe Raises with Counter Support  - 1 x daily - 7 x weekly - 3 sets - 10 reps  ASSESSMENT:  CLINICAL IMPRESSION: Patient is a 82 y.o. who comes to clinic with complaints of low back pain s/p recent fusion surgery with mobility, strength and movement coordination deficits that impair their ability to perform usual daily and recreational functional activities without increase difficulty/symptoms at this time.  Patient to benefit from skilled PT services to address impairments and limitations to improve to previous level of function without restriction secondary to condition.   OBJECTIVE IMPAIRMENTS: Abnormal gait, decreased activity tolerance, decreased balance, decreased coordination, decreased endurance, decreased mobility, difficulty walking, decreased ROM, decreased strength, hypomobility, impaired perceived functional ability, impaired flexibility, improper body mechanics, and pain.   ACTIVITY LIMITATIONS: carrying, bending, squatting, stairs, transfers, and locomotion level  PARTICIPATION LIMITATIONS: meal prep, cleaning, laundry, interpersonal relationship, shopping, and community activity  PERSONAL FACTORS:  Med Hx:  Arthritis, history of cancer(prostate, bladder, carcinoma), HTN, history of stroke  are also affecting patient's functional outcome.   REHAB POTENTIAL: Good  CLINICAL DECISION MAKING: Stable/uncomplicated  EVALUATION COMPLEXITY: Low   GOALS: Goals reviewed with patient? Yes  SHORT TERM GOALS: (target date for Short term goals are 3 weeks 08/14/2023)  1. Patient will demonstrate independent use of home exercise program to maintain progress from in clinic treatments.  Goal status: New  LONG TERM GOALS: (target dates for all long term goals are 12 weeks  10/16/2023 )  1. Patient will demonstrate/report pain at worst less than or equal to 2/10 to facilitate minimal limitation in daily activity secondary to pain symptoms.  Goal status: New   2. Patient  will demonstrate independent use of home exercise program to facilitate ability to maintain/progress functional gains from skilled physical therapy services.  Goal status: New   3. Patient will demonstrate FOTO outcome > or = 71 % to indicate reduced disability due to condition.  Goal status: New   4. Patient will demonstrate lumbar extension 100 % WFL s symptoms to facilitate upright standing, walking posture at PLOF s limitation.  Goal status: New   5.  Patient will demonstrate bilateral tandem stance 30 seconds s deviation for stability in ambulation.   Goal status: New   6.  Patient will demonstrate Lt ankle MMT > or = 4+/5 to improve functional strength in daily activity.  Goal status: New   7.  Patient will demonstrate/report ability to perform walking exercise routine at [facility].  Goal Status: New  PLAN:  PT FREQUENCY: 1-2x/week  PT DURATION: 10 weeks  PLANNED INTERVENTIONS: Can include 29562- PT Re-evaluation, 97110-Therapeutic exercises, 97530- Therapeutic activity, 97112- Neuromuscular re-education, 97535- Self Care, 97140- Manual therapy, [phone]- Gait training, [phone]- Aquatic Therapy, [phone]- Electrical stimulation (unattended)  Patient/Family education, Balance training, Stair training, Taping, Dry Needling, Joint mobilization, Joint manipulation, Spinal manipulation, Spinal mobilization, Scar mobilization, Vestibular training, Visual/preceptual remediation/compensation, DME instructions, Cryotherapy, and Moist heat.  All performed as medically necessary.  All included unless contraindicated  PLAN FOR NEXT SESSION: Review HEP knowledge/results.   Balance improvements compliant and non compliant surface.   Recent lumbar fusion surgery.    Chyrel Masson, PT, DPT, OCS, ATC 07/24/23  3:01 PM   Referring diagnosis? Z98.1 (ICD-10-CM) - S/P lumbar fusion Treatment diagnosis? (if different than referring diagnosis) M54.59 What was this (referring dx) caused by? [x]   Surgery []  Fall []  Ongoing issue []  Arthritis []  Other: ____________  Laterality: []  Rt []  Lt [x]  Both  Check all possible CPT codes:  *CHOOSE 10 OR LESS*    See Planned Interventions listed in the Plan section of the Evaluation.

## 2023-07-24 ENCOUNTER — Ambulatory Visit: Payer: Medicare PPO | Admitting: Rehabilitative and Restorative Service Providers"

## 2023-07-24 ENCOUNTER — Encounter: Payer: Self-pay | Admitting: Rehabilitative and Restorative Service Providers"

## 2023-07-24 ENCOUNTER — Other Ambulatory Visit: Payer: Self-pay

## 2023-07-24 DIAGNOSIS — M5459 Other low back pain: Secondary | ICD-10-CM

## 2023-07-24 DIAGNOSIS — M6281 Muscle weakness (generalized): Secondary | ICD-10-CM

## 2023-07-24 DIAGNOSIS — R262 Difficulty in walking, not elsewhere classified: Secondary | ICD-10-CM | POA: Diagnosis not present

## 2023-08-07 ENCOUNTER — Ambulatory Visit: Payer: Medicare PPO | Admitting: Rehabilitative and Restorative Service Providers"

## 2023-08-07 ENCOUNTER — Encounter: Payer: Self-pay | Admitting: Rehabilitative and Restorative Service Providers"

## 2023-08-07 DIAGNOSIS — M5459 Other low back pain: Secondary | ICD-10-CM

## 2023-08-07 DIAGNOSIS — R262 Difficulty in walking, not elsewhere classified: Secondary | ICD-10-CM

## 2023-08-07 DIAGNOSIS — M6281 Muscle weakness (generalized): Secondary | ICD-10-CM | POA: Diagnosis not present

## 2023-08-07 NOTE — Therapy (Addendum)
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, and also treated for prostate cancer   Dysrhythmia    Hypertension    Paroxysmal atrial fibrillation (HCC) 10/29/2020   Stroke (cerebrum) [facility])    Past Surgical History:  Procedure Laterality Date   CARPAL TUNNEL RELEASE Right 11/26/2021   Procedure: RIGHT CARPAL TUNNEL RELEASE;  Surgeon: Marlyne Beards, MD;  Location: [facility];  Service: Orthopedics;  Laterality: Right;   GOLD SEED IMPLANT N/A 07/30/2022   Procedure: GOLD SEED IMPLANT;  Surgeon: Noel Christmas, MD;  Location: [facility];  Service: Urology;  Laterality: N/A;   PROSTATE BIOPSY N/A 04/24/2022   Procedure: BIOPSY TRANSRECTAL ULTRASONIC PROSTATE (TUBP);  Surgeon: Rene Paci, MD;  Location: [facility];  Service: Urology;  Laterality: N/A;  ONLY NEEDS 60 MIN FOR BOTH   SPACE OAR INSTILLATION N/A 07/30/2022   Procedure: SPACE OAR INSTILLATION;  Surgeon: Noel Christmas, MD;  Location: [facility];  Service: Urology;  Laterality: N/A;   TONSILLECTOMY     and adenioids removed as a child   TRANSURETHRAL RESECTION OF BLADDER TUMOR N/A 04/24/2022   Procedure: POSSIBLE TRANSURETHRAL RESECTION OF BLADDER TUMOR (TURBT);  Surgeon: Rene Paci, MD;  Location: [facility];  Service: Urology;  Laterality: N/A;   Patient Active Problem List   Diagnosis Date Noted   Lumbar radiculopathy 07/08/2023   Weakness of left lower extremity 07/08/2023   Malignant neoplasm of prostate (HCC) 05/22/2022   Carpal tunnel syndrome, right upper limb    Paroxysmal atrial fibrillation (HCC) 11/12/2021   Hypercholesterolemia 11/12/2021    Long term (current) use of anticoagulants 11/12/2021   Bilateral carpal tunnel syndrome 11/08/2021   Acute ischemic stroke (HCC) 09/11/2020    PCP: Shireen Quan DO  REFERRING PROVIDER: London Sheer, MD  REFERRING DIAG: Z98.1 (ICD-10-CM) - S/P lumbar fusion  Rationale for Evaluation and Treatment: Rehabilitation  THERAPY DIAG:  Other low back pain  Muscle weakness (generalized)  Difficulty in walking, not elsewhere classified  ONSET DATE: 07/08/2023 lumbar fusion L2-3, L3-L4  SUBJECTIVE:                                                                                                                                                                                           SUBJECTIVE STATEMENT: Pt indicated no  specific worsening of symptoms  PERTINENT HISTORY:  Med Hx:  Arthritis, history of cancer(prostate, bladder, carcinoma), HTN, history of stroke  PAIN:  NPRS scale: no real pain to report.  Pain location: bilateral instep/arch of feet, anterior/medial Lt and Rt.   Pain description: numbness, constant Aggravating factors: nothing specific Relieving factors: nothing specific   PRECAUTIONS: Back - be aware of lumbar fusion.  History of cancer/stroke  WEIGHT BEARING RESTRICTIONS: No  FALLS:  Has patient fallen in last 6 months? No  LIVING ENVIRONMENT: Lives in: House/apartment Stairs: has stairs 4  with rail to enter house.   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  Travel with hiking in past.   PATIENT GOALS:  Want to be able to walk for exercise (2 miles), exercise routine.    OBJECTIVE:   PATIENT SURVEYS:  07/24/2023 FOTO eval:  56  predicted:  71  SCREENING FOR RED FLAGS: 07/24/2023 Bowel or bladder incontinence: No Cauda equina syndrome: No  COGNITION: 07/24/2023 Overall cognitive status: WFL normal      SENSATION: 07/24/2023 Some decrease in L3-L4-L5 on Lt compared to right.   MUSCLE  LENGTH: 07/24/2023 No specific testing today  POSTURE:  07/24/2023 unremarkable  PALPATION: 07/24/2023 No specific testing  LUMBAR ROM:   AROM 07/24/2023  Flexion   Extension 50 %   Right lateral flexion To mid thigh  Left lateral flexion To mid thigh  Right rotation   Left rotation    (Blank rows = not tested)  LOWER EXTREMITY ROM:      Right 07/24/2023 Left 07/24/2023  Hip flexion    Hip extension    Hip abduction    Hip adduction    Hip internal rotation    Hip external rotation    Knee flexion    Knee extension    Ankle dorsiflexion    Ankle plantarflexion    Ankle inversion    Ankle eversion     (Blank rows = not tested)  LOWER EXTREMITY MMT:    MMT Right 07/24/2023 Left 07/24/2023  Hip flexion 5/5 5/5  Hip extension    Hip abduction    Hip adduction    Hip internal rotation    Hip external rotation    Knee flexion 5/5 5/5  Knee extension 5/5 5/5  Ankle dorsiflexion 5/5 3+/5  Ankle plantarflexion    Ankle inversion 5/5 4/5  Ankle eversion 5/5 5/5   (Blank rows = not tested)  LUMBAR SPECIAL TESTS:  07/24/2023 No specific testing  FUNCTIONAL TESTS:  07/24/2023 18 inch chair transfer:  able unassisted from UE on 1st try Tandem stance attempts < 10 seconds bilateral prior to loss of balance.   GAIT: 08/07/2023:  Independent ambulation  07/24/2023 SPC noted in Rt UE.  Difficulty in Lt foot clearance noted.  TODAY'S TREATMENT:                                                                                                         DATE: 08/07/2023  Therex: Trial of several activity related to [facility] use with education. Nustep Lvl 6 6 mins UE/LE Recumbent bike lvl 3 6 mins  Supine figure 4 pull towards 30 sec x 3 Lt leg Supine  ipsilateral hip flexion hold against hand isometric 5 sec hold x 10   Neuro Re-ed Tandem stance on foam 1 min x 2 bilateral with occasional HHA on bar.  Standing on foam bar horizontal alightment (heels/toes off) with anterior/posterior weight shift ankle strategy improvements 2 min in // bars.     TODAY'S TREATMENT:                                                                                                         DATE: 07/24/2023  Therex:    HEP instruction/performance c cues for techniques, handout provided.  Trial set performed of each for comprehension and symptom assessment.  See below for exercise list  PATIENT EDUCATION:  07/24/2023 Education details: HEP, POC Person educated: Patient Education method: Explanation, Demonstration, Verbal cues, and Handouts Education comprehension: verbalized understanding, returned demonstration, and verbal cues required  HOME EXERCISE PROGRAM: Access Code: [facility] URL: [email or web address] Date: 07/24/2023 Prepared by: Chyrel Masson  Exercises - Supine Piriformis Stretch with Foot on Ground  - 1-2 x daily - 7 x weekly - 1 sets - 5 reps - 30 hold - Supine Bridge with Resistance Band  - 1-2 x daily - 7 x weekly - 1-2 sets - 10 reps - Hooklying Isometric Clamshell  - 1-2 x daily - 7 x weekly - 2-3 sets - 10-15 reps - Seated Abdominal Press into [employer]  - 1-2 x daily - 7 x weekly - 1 sets - 10 reps - 5-10 hold - Seated Multifidi Isometric  - 1-2 x daily - 7 x weekly - 1 sets - 10 reps - 5-10 hold - Seated Ankle Dorsiflexion with Resistance (Mirrored)  - 1-2 x daily - 7 x weekly - 1-2 sets - 10-15 reps - Tandem Stance in Corner  - 1 x daily - 7 x weekly - 1 sets - 3-5 reps - 30-60 hold - Heel Toe Raises with Counter Support  - 1 x daily - 7 x weekly - 3 sets - 10 reps  ASSESSMENT:  CLINICAL IMPRESSION: Good overall recall of HEP.  Pt to benefit from continued efforts to improve aerobic exercise routine and improve DF  strength and overall balance control.  Ambulation into  clinic    OBJECTIVE IMPAIRMENTS: Abnormal gait, decreased activity tolerance, decreased balance, decreased coordination, decreased endurance, decreased mobility, difficulty walking, decreased ROM, decreased strength, hypomobility, impaired perceived functional ability, impaired flexibility, improper body mechanics, and pain.   ACTIVITY LIMITATIONS: carrying, bending, squatting, stairs, transfers, and locomotion level  PARTICIPATION LIMITATIONS: meal prep, cleaning, laundry, interpersonal relationship, shopping, and community activity  PERSONAL FACTORS:  Med Hx:  Arthritis, history of cancer(prostate, bladder, carcinoma), HTN, history of stroke  are also affecting patient's functional outcome.   REHAB POTENTIAL: Good  CLINICAL DECISION MAKING: Stable/uncomplicated  EVALUATION COMPLEXITY: Low   GOALS: Goals reviewed with patient? Yes  SHORT TERM GOALS: (target date for Short term goals are 3 weeks 08/14/2023)  1. Patient will demonstrate independent use of home exercise program to maintain progress from in clinic treatments.  Goal status: Met 08/07/2023  LONG TERM GOALS: (target dates for all long term goals are 12 weeks  10/16/2023 )   1. Patient will demonstrate/report pain at worst less than or equal to 2/10 to facilitate minimal limitation in daily activity secondary to pain symptoms.  Goal status: New   2. Patient will demonstrate independent use of home exercise program to facilitate ability to maintain/progress functional gains from skilled physical therapy services.  Goal status: New   3. Patient will demonstrate FOTO outcome > or = 71 % to indicate reduced disability due to condition.  Goal status: New   4. Patient will demonstrate lumbar extension 100 % WFL s symptoms to facilitate upright standing, walking posture at PLOF s limitation.  Goal status: New   5.  Patient will demonstrate bilateral tandem stance 30  seconds s deviation for stability in ambulation.   Goal status: New   6.  Patient will demonstrate Lt ankle MMT > or = 4+/5 to improve functional strength in daily activity.  Goal status: New   7.  Patient will demonstrate/report ability to perform walking exercise routine at [facility].  Goal Status: New  PLAN:  PT FREQUENCY: 1-2x/week  PT DURATION: 10 weeks  PLANNED INTERVENTIONS: Can include 30865- PT Re-evaluation, 97110-Therapeutic exercises, 97530- Therapeutic activity, 97112- Neuromuscular re-education, 97535- Self Care, 97140- Manual therapy, [phone]- Gait training, [phone]- Aquatic Therapy, [phone]- Electrical stimulation (unattended)  Patient/Family education, Balance training, Stair training, Taping, Dry Needling, Joint mobilization, Joint manipulation, Spinal manipulation, Spinal mobilization, Scar mobilization, Vestibular training, Visual/preceptual remediation/compensation, DME instructions, Cryotherapy, and Moist heat.  All performed as medically necessary.  All included unless contraindicated  PLAN FOR NEXT SESSION: Continued DF activation intervention, core strengthening   Recent lumbar fusion surgery.    Chyrel Masson, PT, DPT, OCS, ATC 08/14/23  3:09 PM   Referring diagnosis? Z98.1 (ICD-10-CM) - S/P lumbar fusion Treatment diagnosis? (if different than referring diagnosis) M54.59 What was this (referring dx) caused by? [x]  Surgery []  Fall []  Ongoing issue []  Arthritis []  Other: ____________  Laterality: []  Rt []  Lt [x]  Both  Check all possible CPT codes:  *CHOOSE 10 OR LESS*    See Planned Interventions listed in the Plan section of the Evaluation.

## 2023-08-14 ENCOUNTER — Ambulatory Visit: Payer: Medicare PPO | Admitting: Rehabilitative and Restorative Service Providers"

## 2023-08-14 ENCOUNTER — Encounter: Payer: Self-pay | Admitting: Rehabilitative and Restorative Service Providers"

## 2023-08-14 DIAGNOSIS — M5459 Other low back pain: Secondary | ICD-10-CM | POA: Diagnosis not present

## 2023-08-14 DIAGNOSIS — R262 Difficulty in walking, not elsewhere classified: Secondary | ICD-10-CM

## 2023-08-14 DIAGNOSIS — M6281 Muscle weakness (generalized): Secondary | ICD-10-CM

## 2023-08-14 NOTE — Therapy (Signed)
OUTPATIENT PHYSICAL THERAPY TREATMENT   Patient Name: James Ramsey MRN: [id number] DOB:1940-11-30, 82 y.o., male Today's Date: 08/14/2023  END OF SESSION:  PT End of Session - 08/14/23 1500     Visit Number 3    Number of Visits 24    Date for PT Re-Evaluation 10/16/23    Authorization Type [employer] $20 copay    Authorization Time Period -10/15/2022    Authorization - Visit Number 3    Authorization - Number of Visits 12    Progress Note Due on Visit 10    PT Start Time 1503    PT Stop Time 1542    PT Time Calculation (min) 39 min    Activity Tolerance Patient tolerated treatment well    Behavior During Therapy [facility] for tasks assessed/performed               Past Medical History:  Diagnosis Date   Arthritis    left hip   Cancer (HCC)    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;  Service: Urology;  Laterality: N/A;   PROSTATE BIOPSY N/A 04/24/2022   Procedure: BIOPSY TRANSRECTAL ULTRASONIC PROSTATE (TUBP);  Surgeon: Rene Paci, MD;  Location: [facility];  Service: Urology;  Laterality: N/A;  ONLY NEEDS 60 MIN FOR BOTH   SPACE OAR INSTILLATION N/A 07/30/2022   Procedure: SPACE OAR INSTILLATION;  Surgeon: Noel Christmas, MD;  Location: [facility];  Service: Urology;  Laterality: N/A;   TONSILLECTOMY     and adenioids removed as a child   TRANSURETHRAL RESECTION OF BLADDER TUMOR  N/A 04/24/2022   Procedure: POSSIBLE TRANSURETHRAL RESECTION OF BLADDER TUMOR (TURBT);  Surgeon: Rene Paci, MD;  Location: [facility];  Service: Urology;  Laterality: N/A;   Patient Active Problem List   Diagnosis Date Noted   Lumbar radiculopathy 07/08/2023   Weakness of left lower extremity 07/08/2023   Malignant neoplasm of prostate (HCC) 05/22/2022   Carpal tunnel syndrome, right upper limb    Paroxysmal atrial fibrillation (HCC) 11/12/2021   Hypercholesterolemia 11/12/2021   Long term (current) use of anticoagulants 11/12/2021   Bilateral carpal tunnel syndrome 11/08/2021   Acute ischemic stroke (HCC) 09/11/2020    PCP: Shireen Quan DO  REFERRING PROVIDER: London Sheer, MD  REFERRING DIAG: Z98.1 (ICD-10-CM) - S/P lumbar fusion  Rationale for Evaluation and Treatment: Rehabilitation  THERAPY DIAG:  Other low back pain  Muscle weakness (generalized)  Difficulty in walking, not elsewhere classified  ONSET DATE: 07/08/2023 lumbar fusion L2-3, L3-L4  SUBJECTIVE:  SUBJECTIVE STATEMENT: 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   PERTINENT HISTORY:  Med Hx:  Arthritis, history of cancer(prostate, bladder, carcinoma), HTN, history of stroke  PAIN:  NPRS scale: no pain to report.  Pain location:  Pain description: numbness, constant Aggravating factors: nothing specific Relieving factors: nothing specific   PRECAUTIONS: Back - be aware of lumbar fusion.  History of cancer/stroke  WEIGHT BEARING RESTRICTIONS: No  FALLS:  Has patient fallen in last 6 months? No  LIVING ENVIRONMENT: Lives in: House/apartment Stairs: has stairs 4  with rail to enter house.   OCCUPATION: Retired  PLOF: Independent, play  flute/recorder, reading, writing.  Travel with hiking in past.   PATIENT GOALS:  Want to be able to walk for exercise (2 miles), exercise routine.    OBJECTIVE:   PATIENT SURVEYS:  08/14/2023: FOTO update:  72  07/24/2023 FOTO eval:  56  predicted:  71  SCREENING FOR RED FLAGS: 07/24/2023 Bowel or bladder incontinence: No Cauda equina syndrome: No  COGNITION: 07/24/2023 Overall cognitive status: WFL normal      SENSATION: 07/24/2023 Some decrease in L3-L4-L5 on Lt compared to right.   MUSCLE LENGTH: 07/24/2023 No specific testing today  POSTURE:  07/24/2023 unremarkable  PALPATION: 07/24/2023 No specific testing  LUMBAR ROM:   AROM 07/24/2023 08/14/2023  Flexion    Extension 50 %  75% WFL  Right lateral flexion To mid thigh   Left lateral flexion To mid thigh   Right rotation    Left rotation     (Blank rows = not tested)  LOWER EXTREMITY ROM:      Right 07/24/2023 Left 07/24/2023  Hip flexion    Hip extension    Hip abduction    Hip adduction    Hip internal rotation    Hip external rotation    Knee flexion    Knee extension    Ankle dorsiflexion    Ankle plantarflexion    Ankle inversion    Ankle eversion     (Blank rows = not tested)  LOWER EXTREMITY MMT:    MMT Right 07/24/2023 Left 07/24/2023 Left 08/14/2023  Hip flexion 5/5 5/5   Hip extension     Hip abduction     Hip adduction     Hip internal rotation     Hip external rotation     Knee flexion 5/5 5/5   Knee extension 5/5 5/5   Ankle dorsiflexion 5/5 3+/5 3+/5  Ankle plantarflexion     Ankle inversion 5/5 4/5 4+/5  Ankle eversion 5/5 5/5 5/5   (Blank rows = not tested)  LUMBAR SPECIAL TESTS:  07/24/2023 No specific testing  FUNCTIONAL TESTS:  07/24/2023 18 inch chair transfer:  able unassisted from UE on 1st try Tandem stance attempts < 10 seconds bilateral prior to loss of balance.   GAIT: 08/07/2023:  Independent ambulation  07/24/2023 SPC noted in Rt UE.   Difficulty in Lt foot clearance noted.  TODAY'S TREATMENT:                                                                                                         DATE: 08/14/2023  Therex: Recumbent bike lvl 4 10 mins, seat 8  Verbal cues for aerobic exercise progression per effort level/reported exertion.  Seated Lt ankle DF green band 3 x 10   Neuro Re-ed Fitter rocker board fwd/back light touch control with occasional HHA on bar x 25 each way Fitter rocker board anterior/posterior balance attempt 1 min with occasional HHA on bars  Tandem stance 1 min x 1 bilateral on foam with occasional HHA Ambulation fwd/back on foam 6 ft x 6 each way with occasional HHA SLS on foam with contralateral leg touching each corner x 8 each , performed bilaterally with occasional to moderate HHA.   TODAY'S TREATMENT:                                                                                                         DATE: 08/07/2023  Therex: Trial of several activity related to [facility] use with education. Nustep Lvl 6 6 mins UE/LE Recumbent bike lvl 3 6 mins  Supine figure 4 pull towards 30 sec x 3 Lt leg Supine ipsilateral hip flexion hold against hand isometric 5 sec hold x 10   Neuro Re-ed Tandem stance on foam 1 min x 2 bilateral with occasional HHA on bar.  Standing on foam bar horizontal alightment (heels/toes off) with anterior/posterior weight shift ankle strategy improvements 2 min in // bars.     TODAY'S TREATMENT:                                                                                                         DATE: 07/24/2023  Therex:    HEP instruction/performance c cues for techniques, handout provided.  Trial set performed of each for comprehension and symptom assessment.  See  below for exercise list  PATIENT EDUCATION:  07/24/2023 Education details: HEP, POC Person educated: Patient Education method: Explanation, Demonstration, Verbal cues, and Handouts Education comprehension: verbalized understanding, returned demonstration, and verbal cues required  HOME EXERCISE PROGRAM: Access Code: [facility] URL: [email or web address] Date: 07/24/2023  Prepared by: Chyrel Masson  Exercises - Supine Piriformis Stretch with Foot on Ground  - 1-2 x daily - 7 x weekly - 1 sets - 5 reps - 30 hold - Supine Bridge with Resistance Band  - 1-2 x daily - 7 x weekly - 1-2 sets - 10 reps - Hooklying Isometric Clamshell  - 1-2 x daily - 7 x weekly - 2-3 sets - 10-15 reps - Seated Abdominal Press into [employer]  - 1-2 x daily - 7 x weekly - 1 sets - 10 reps - 5-10 hold - Seated Multifidi Isometric  - 1-2 x daily - 7 x weekly - 1 sets - 10 reps - 5-10 hold - Seated Ankle Dorsiflexion with Resistance (Mirrored)  - 1-2 x daily - 7 x weekly - 1-2 sets - 10-15 reps - Tandem Stance in Corner  - 1 x daily - 7 x weekly - 1 sets - 3-5 reps - 30-60 hold - Heel Toe Raises with Counter Support  - 1 x daily - 7 x weekly - 3 sets - 10 reps  ASSESSMENT:  CLINICAL IMPRESSION: Continued ambulation independent.  FOTO score improved greatly as noted in objective data.  At this time, good progression in inclusion of HEP and return to gym activity to help promote improved activity tolerance and functional strength.    OBJECTIVE IMPAIRMENTS: Abnormal gait, decreased activity tolerance, decreased balance, decreased coordination, decreased endurance, decreased mobility, difficulty walking, decreased ROM, decreased strength, hypomobility, impaired perceived functional ability, impaired flexibility, improper body mechanics, and pain.   ACTIVITY LIMITATIONS: carrying, bending, squatting, stairs, transfers, and locomotion level  PARTICIPATION LIMITATIONS: meal prep, cleaning, laundry,  interpersonal relationship, shopping, and community activity  PERSONAL FACTORS:  Med Hx:  Arthritis, history of cancer(prostate, bladder, carcinoma), HTN, history of stroke  are also affecting patient's functional outcome.   REHAB POTENTIAL: Good  CLINICAL DECISION MAKING: Stable/uncomplicated  EVALUATION COMPLEXITY: Low   GOALS: Goals reviewed with patient? Yes  SHORT TERM GOALS: (target date for Short term goals are 3 weeks 08/14/2023)  1. Patient will demonstrate independent use of home exercise program to maintain progress from in clinic treatments.  Goal status: Met 08/07/2023  LONG TERM GOALS: (target dates for all long term goals are 12 weeks  10/16/2023 )   1. Patient will demonstrate/report pain at worst less than or equal to 2/10 to facilitate minimal limitation in daily activity secondary to pain symptoms.  Goal status: New   2. Patient will demonstrate independent use of home exercise program to facilitate ability to maintain/progress functional gains from skilled physical therapy services.  Goal status: New   3. Patient will demonstrate FOTO outcome > or = 71 % to indicate reduced disability due to condition.  Goal status: Met 08/14/2023   4. Patient will demonstrate lumbar extension 100 % WFL s symptoms to facilitate upright standing, walking posture at PLOF s limitation.  Goal status: New   5.  Patient will demonstrate bilateral tandem stance 30 seconds s deviation for stability in ambulation.   Goal status: New   6.  Patient will demonstrate Lt ankle MMT > or = 4+/5 to improve functional strength in daily activity.  Goal status: New   7.  Patient will demonstrate/report ability to perform walking exercise routine at [facility].  Goal Status: New  PLAN:  PT FREQUENCY: 1-2x/week  PT DURATION: 10 weeks  PLANNED INTERVENTIONS: Can include 60454- PT Re-evaluation, 97110-Therapeutic exercises, 97530- Therapeutic activity, [id number]- Neuromuscular re-education,  97535- Self Care, [id number]- Manual  therapy, [id number]- Gait training, 34742- Aquatic Therapy, [phone]- Electrical stimulation (unattended)  Patient/Family education, Balance training, Stair training, Taping, Dry Needling, Joint mobilization, Joint manipulation, Spinal manipulation, Spinal mobilization, Scar mobilization, Vestibular training, Visual/preceptual remediation/compensation, DME instructions, Cryotherapy, and Moist heat.  All performed as medically necessary.  All included unless contraindicated  PLAN FOR NEXT SESSION: Follow up from MD visit, discussion possible HEP transitioning plan.   Recent lumbar fusion surgery.    Chyrel Masson, PT, DPT, OCS, ATC 08/14/23  3:40 PM   Referring diagnosis? Z98.1 (ICD-10-CM) - S/P lumbar fusion Treatment diagnosis? (if different than referring diagnosis) M54.59 What was this (referring dx) caused by? [x]  Surgery []  Fall []  Ongoing issue []  Arthritis []  Other: ____________  Laterality: []  Rt []  Lt [x]  Both  Check all possible CPT codes:  *CHOOSE 10 OR LESS*    See Planned Interventions listed in the Plan section of the Evaluation.

## 2023-08-20 ENCOUNTER — Other Ambulatory Visit (INDEPENDENT_AMBULATORY_CARE_PROVIDER_SITE_OTHER): Payer: Self-pay

## 2023-08-20 ENCOUNTER — Ambulatory Visit (INDEPENDENT_AMBULATORY_CARE_PROVIDER_SITE_OTHER): Payer: Medicare PPO | Admitting: Orthopedic Surgery

## 2023-08-20 DIAGNOSIS — Z981 Arthrodesis status: Secondary | ICD-10-CM | POA: Diagnosis not present

## 2023-08-20 NOTE — Progress Notes (Signed)
Orthopedic Surgery Post-operative Office Visit   Procedure: L2-4 laminectomies, L2-4 PSIF Date of Surgery: 07/08/2023 (~6 weeks post-op)   Assessment: Patient is a 82 y.o. who is doing well after surgery     Plan: -Operative plans complete -Out of bed as tolerated, no brace -No bending/lifting/twisting greater than 10 pounds -Okay to submerge wound at this point -Pain management: OTC medications -Return to office in 6 weeks, x-rays needed at next visit: AP/lateral/flex/ex lumbar   ___________________________________________________________________________     Subjective: Patient has been doing well since surgery.  He is not having any back pain.  He is not having any pain radiating into his leg.  He is working with PT.  He feels that his ability to ambulate has improved.  He still has weakness with dorsiflexion on the left foot.  Has not noticed any redness or drainage around his incision.     Objective:   General: no acute distress, appropriate affect Neurologic: alert, answering questions appropriately, following commands Respiratory: unlabored breathing on room air Skin: incision is well healed with no erythema, induration, active/expressible drainage   MSK (spine):   -Strength exam                                                   Left                  Right   EHL                              4/5                  5/5 TA                                 4/5                  5/5 GSC                             5/5                  5/5 Knee extension            5/5                  5/5 Hip flexion                    5/5                  5/5   -Sensory exam                           Sensation intact to light touch in L3-S1 nerve distributions of bilateral lower extremities   Imaging: X-rays of the lumbar spine taken 08/20/2023 were independently reviewed and interpreted, showing instrumentation from L2-L4 in appropriate position. There is no lucency around the screws.  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 No fracture or dislocation seen.      Patient name: James Ramsey Patient MRN: [id number] Date of visit: 08/20/23

## 2023-08-23 ENCOUNTER — Other Ambulatory Visit: Payer: Self-pay | Admitting: Nurse Practitioner

## 2023-08-23 DIAGNOSIS — I480 Paroxysmal atrial fibrillation: Secondary | ICD-10-CM

## 2023-08-25 NOTE — Telephone Encounter (Signed)
Prescription refill request for Eliquis received. Indication:afib Last office visit:9/24 Scr:0.90  11/24 Age: 82 Weight:86.2  kg  Prescription refilled

## 2023-08-28 ENCOUNTER — Ambulatory Visit: Payer: Medicare PPO | Admitting: Rehabilitative and Restorative Service Providers"

## 2023-08-28 ENCOUNTER — Encounter: Payer: Self-pay | Admitting: Rehabilitative and Restorative Service Providers"

## 2023-08-28 DIAGNOSIS — M6281 Muscle weakness (generalized): Secondary | ICD-10-CM | POA: Diagnosis not present

## 2023-08-28 DIAGNOSIS — R262 Difficulty in walking, not elsewhere classified: Secondary | ICD-10-CM | POA: Diagnosis not present

## 2023-08-28 DIAGNOSIS — M5459 Other low back pain: Secondary | ICD-10-CM

## 2023-08-28 NOTE — Therapy (Signed)
OUTPATIENT PHYSICAL THERAPY TREATMENT / DISCHARGE   Patient Name: James Ramsey MRN: [id number] DOB:1941/06/12, 82 y.o., male Today's Date: 08/28/2023  PHYSICAL THERAPY DISCHARGE SUMMARY  Visits from Start of Care: 4  Current functional level related to goals / functional outcomes: See note   Remaining deficits: See note   Education / Equipment: HEP  Patient goals were  mostly met . Patient is being discharged due to being pleased with the current functional level.    END OF SESSION:  PT End of Session - 08/28/23 1501     Visit Number 4    Number of Visits 24    Date for PT Re-Evaluation 10/16/23    Authorization Type [employer] $20 copay    Authorization Time Period -10/15/2022    Authorization - Visit Number 4    Authorization - Number of Visits 12    Progress Note Due on Visit 10    PT Start Time 1501    PT Stop Time 1542    PT Time Calculation (min) 41 min    Activity Tolerance Patient tolerated treatment well    Behavior During Therapy WFL for tasks assessed/performed                Past Medical History:  Diagnosis Date   Arthritis    left hip   Cancer (HCC)    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, and also treated for prostate cancer   Dysrhythmia    Hypertension    Paroxysmal atrial fibrillation (HCC) 10/29/2020   Stroke (cerebrum) [facility])    Past Surgical History:  Procedure Laterality Date   CARPAL TUNNEL RELEASE Right 11/26/2021   Procedure: RIGHT CARPAL TUNNEL RELEASE;  Surgeon: Marlyne Beards, MD;  Location: [facility];  Service: Orthopedics;  Laterality: Right;   GOLD SEED IMPLANT N/A 07/30/2022   Procedure: GOLD SEED IMPLANT;  Surgeon: Noel Christmas, MD;  Location: [facility];  Service: Urology;  Laterality: N/A;   PROSTATE BIOPSY N/A 04/24/2022   Procedure: BIOPSY TRANSRECTAL ULTRASONIC PROSTATE (TUBP);  Surgeon: Rene Paci, MD;  [facility];   Service: Urology;  Laterality: N/A;  ONLY NEEDS 60 MIN FOR BOTH   SPACE OAR INSTILLATION N/A 07/30/2022   Procedure: SPACE OAR INSTILLATION;  Surgeon: Noel Christmas, MD;  Location: [facility];  Service: Urology;  Laterality: N/A;   TONSILLECTOMY     and adenioids removed as a child   TRANSURETHRAL RESECTION OF BLADDER TUMOR N/A 04/24/2022   Procedure: POSSIBLE TRANSURETHRAL RESECTION OF BLADDER TUMOR (TURBT);  Surgeon: Rene Paci, MD;  Location: [facility];  Service: Urology;  Laterality: N/A;   Patient Active Problem List   Diagnosis Date Noted   Lumbar radiculopathy 07/08/2023   Weakness of left lower extremity 07/08/2023   Malignant neoplasm of prostate (HCC) 05/22/2022   Carpal tunnel syndrome, right upper limb    Paroxysmal atrial fibrillation (HCC) 11/12/2021   Hypercholesterolemia 11/12/2021   Long term (current) use of anticoagulants 11/12/2021   Bilateral carpal tunnel syndrome 11/08/2021   Acute ischemic stroke (HCC) 09/11/2020    PCP: Shireen Quan DO  REFERRING PROVIDER: London Sheer, MD  REFERRING DIAG: Z98.1 (ICD-10-CM) - S/P lumbar fusion  Rationale for Evaluation and Treatment: Rehabilitation  THERAPY DIAG:  Other low back pain  Muscle weakness (generalized)  Difficulty in walking, not elsewhere classified  ONSET DATE: 07/08/2023 lumbar fusion L2-3, L3-L4  SUBJECTIVE:  SUBJECTIVE STATEMENT: Pt indicated going to leave for cruise tomorrow.    PERTINENT HISTORY:  Med Hx:  Arthritis, history of cancer(prostate, bladder, carcinoma), HTN, history of stroke  PAIN:  NPRS scale: no pain to report.  Pain location:  Pain description: numbness, constant Aggravating factors: nothing specific Relieving factors: nothing specific    PRECAUTIONS: Back - be aware of lumbar fusion.  History of cancer/stroke  WEIGHT BEARING RESTRICTIONS: No  FALLS:  Has patient fallen in last 6 months? No  LIVING ENVIRONMENT: Lives in: House/apartment Stairs: has stairs 4  with rail to enter house.   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  Travel with hiking in past.   PATIENT GOALS:  Want to be able to walk for exercise (2 miles), exercise routine.    OBJECTIVE:   PATIENT SURVEYS:  08/14/2023: FOTO update:  72  07/24/2023 FOTO eval:  56  predicted:  71  SCREENING FOR RED FLAGS: 07/24/2023 Bowel or bladder incontinence: No Cauda equina syndrome: No  COGNITION: 07/24/2023 Overall cognitive status: WFL normal      SENSATION: 07/24/2023 Some decrease in L3-L4-L5 on Lt compared to right.   MUSCLE LENGTH: 07/24/2023 No specific testing today  POSTURE:  07/24/2023 unremarkable  PALPATION: 07/24/2023 No specific testing  LUMBAR ROM:   AROM 07/24/2023 08/14/2023  Flexion    Extension 50 %  75% WFL  Right lateral flexion To mid thigh   Left lateral flexion To mid thigh   Right rotation    Left rotation     (Blank rows = not tested)  LOWER EXTREMITY ROM:      Right 07/24/2023 Left 07/24/2023  Hip flexion    Hip extension    Hip abduction    Hip adduction    Hip internal rotation    Hip external rotation    Knee flexion    Knee extension    Ankle dorsiflexion    Ankle plantarflexion    Ankle inversion    Ankle eversion     (Blank rows = not tested)  LOWER EXTREMITY MMT:    MMT Right 07/24/2023 Left 07/24/2023 Left 08/14/2023 Left 08/28/2023  Hip flexion 5/5 5/5    Hip extension      Hip abduction      Hip adduction      Hip internal rotation      Hip external rotation      Knee flexion 5/5 5/5    Knee extension 5/5 5/5    Ankle dorsiflexion 5/5 3+/5 3+/5 3+/5  Ankle plantarflexion      Ankle inversion 5/5 4/5 4+/5 5/5  Ankle eversion  5/5 5/5 5/5 5/5   (Blank rows = not tested)  LUMBAR SPECIAL TESTS:  07/24/2023 No specific testing  FUNCTIONAL TESTS:  08/28/2023: Lt SLS:  20 seconds  Rt SLS: 30 seconds without loss of balance  Tandem stance Lt posterior: 1 min without loss of balance    Rt posterior:  1 min without loss of balance  07/24/2023 18 inch chair transfer:  able unassisted from UE on 1st try Tandem stance attempts < 10 seconds bilateral prior to loss of balance.   GAIT: 08/07/2023:  Independent ambulation  07/24/2023 SPC noted in Rt UE.  Difficulty in Lt foot clearance noted.  TODAY'S TREATMENT:                                                                                                         DATE: 08/28/2023  Therex: Nustep Lvl 6 10 mins  Incline gastroc stretch 30 sec x 3 bilateral (HEP addition) Verbal review of existing HEP.    Neuro Re-ed [employer] fwd/back light touch control with occasional HHA on bar x 30 each way Fitter rocker board anterior/posterior balance attempt 1 min x2  with occasional HHA on bars  Feet hip width apart on foam with alternating PF/DF x 30 each way with occasional HHA  SLS on foam with 9 inch hurdle step over and back contralateral leg x 15 bilaterally with occasional HHA  Tandem stance attempt x 1 bilateral - 1 min each  SLS attempt bilateral 30 seconds on Rt , 20 seconds on Lt  TODAY'S TREATMENT:                                                                                                         DATE: 08/14/2023  Therex: Recumbent bike lvl 4 10 mins, seat 8  Verbal cues for aerobic exercise progression per effort level/reported exertion.  Seated Lt ankle DF green band 3 x 10   Neuro Re-ed [employer] rocker board fwd/back light touch control  with occasional HHA on bar x 25 each way Fitter rocker board anterior/posterior balance attempt 1 min with occasional HHA on bars  Tandem stance 1 min x 1 bilateral on foam with occasional HHA Ambulation fwd/back on foam 6 ft x 6 each way with occasional HHA SLS on foam with contralateral leg touching each corner x 8 each , performed bilaterally with occasional to moderate HHA.   TODAY'S TREATMENT:                                                                                                         DATE: 08/07/2023  Therex: Trial of several activity related to [facility] use with education. Nustep Lvl 6 6 mins UE/LE Recumbent bike lvl 3 6 mins  Supine figure 4 pull towards 30 sec x 3 Lt leg Supine ipsilateral hip flexion hold against  hand isometric 5 sec hold x 10   Neuro Re-ed Tandem stance on foam 1 min x 2 bilateral with occasional HHA on bar.  Standing on foam bar horizontal alightment (heels/toes off) with anterior/posterior weight shift ankle strategy improvements 2 min in // bars.    PATIENT EDUCATION:  07/24/2023 Education details: HEP, POC Person educated: Patient Education method: Programmer, multimedia, Demonstration, Verbal cues, and Handouts Education comprehension: verbalized understanding, returned demonstration, and verbal cues required  HOME EXERCISE PROGRAM: Access Code: [facility] URL: [email or web address] Date: 07/24/2023 Prepared by: Chyrel Masson  Exercises - Supine Piriformis Stretch with Foot on Ground  - 1-2 x daily - 7 x weekly - 1 sets - 5 reps - 30 hold - Supine Bridge with Resistance Band  - 1-2 x daily - 7 x weekly - 1-2 sets - 10 reps - Hooklying Isometric Clamshell  - 1-2 x daily - 7 x weekly - 2-3 sets - 10-15 reps - Seated Abdominal Press into [employer]  - 1-2 x daily - 7 x weekly - 1 sets - 10 reps - 5-10 hold - Seated Multifidi Isometric  - 1-2 x daily - 7 x weekly - 1 sets - 10 reps - 5-10 hold - Seated Ankle Dorsiflexion with Resistance  (Mirrored)  - 1-2 x daily - 7 x weekly - 1-2 sets - 10-15 reps - Tandem Stance in Corner  - 1 x daily - 7 x weekly - 1 sets - 3-5 reps - 30-60 hold - Heel Toe Raises with Counter Support  - 1 x daily - 7 x weekly - 3 sets - 10 reps  ASSESSMENT:  CLINICAL IMPRESSION: Pt was knowledgeable and appropriate for discharge to HEP at this time due to progression.  DF lacking still in strength but getting better slowly with good plan in HEP to continue to address.  See objective data for other updated information.  Discharge today with referral needed upon return.    OBJECTIVE IMPAIRMENTS: Abnormal gait, decreased activity tolerance, decreased balance, decreased coordination, decreased endurance, decreased mobility, difficulty walking, decreased ROM, decreased strength, hypomobility, impaired perceived functional ability, impaired flexibility, improper body mechanics, and pain.   ACTIVITY LIMITATIONS: carrying, bending, squatting, stairs, transfers, and locomotion level  PARTICIPATION LIMITATIONS: meal prep, cleaning, laundry, interpersonal relationship, shopping, and community activity  PERSONAL FACTORS:  Med Hx:  Arthritis, history of cancer(prostate, bladder, carcinoma), HTN, history of stroke  are also affecting patient's functional outcome.   REHAB POTENTIAL: Good  CLINICAL DECISION MAKING: Stable/uncomplicated  EVALUATION COMPLEXITY: Low   GOALS: Goals reviewed with patient? Yes  SHORT TERM GOALS: (target date for Short term goals are 3 weeks 08/14/2023)  1. Patient will demonstrate independent use of home exercise program to maintain progress from in clinic treatments.  Goal status: Met 08/07/2023  LONG TERM GOALS: (target dates for all long term goals are 12 weeks  10/16/2023 )   1. Patient will demonstrate/report pain at worst less than or equal to 2/10 to facilitate minimal limitation in daily activity secondary to pain symptoms.  Goal status: Met 08/28/2023   2. Patient will  demonstrate independent use of home exercise program to facilitate ability to maintain/progress functional gains from skilled physical therapy services.  Goal status: Met 08/28/2023   3. Patient will demonstrate FOTO outcome > or = 71 % to indicate reduced disability due to condition.  Goal status: Met 08/14/2023   4. Patient will demonstrate lumbar extension 100 % WFL s symptoms to facilitate upright standing, walking posture at PLOF s  limitation.  Goal status: mostly Met 08/28/2023   5.  Patient will demonstrate bilateral tandem stance 30 seconds s deviation for stability in ambulation.   Goal status: Met 08/28/2023   6.  Patient will demonstrate Lt ankle MMT > or = 4+/5 to improve functional strength in daily activity.  Goal status: partially Met 08/28/2023   7.  Patient will demonstrate/report ability to perform walking exercise routine at [facility].  Goal Status: Met 08/28/2023  PLAN:  PT FREQUENCY: 1-2x/week  PT DURATION: 10 weeks  PLANNED INTERVENTIONS: Can include 95188- PT Re-evaluation, 97110-Therapeutic exercises, 97530- Therapeutic activity, 97112- Neuromuscular re-education, 97535- Self Care, 97140- Manual therapy, [phone]- Gait training, [phone]- Aquatic Therapy, [phone]- Electrical stimulation (unattended)  Patient/Family education, Balance training, Stair training, Taping, Dry Needling, Joint mobilization, Joint manipulation, Spinal manipulation, Spinal mobilization, Scar mobilization, Vestibular training, Visual/preceptual remediation/compensation, DME instructions, Cryotherapy, and Moist heat.  All performed as medically necessary.  All included unless contraindicated  PLAN FOR NEXT SESSION:  Discharge to HEP and vacation.   Recent lumbar fusion surgery.    Chyrel Masson, PT, DPT, OCS, ATC 08/28/23  3:49 PM   Referring diagnosis? Z98.1 (ICD-10-CM) - S/P lumbar fusion Treatment diagnosis? (if different than referring diagnosis) M54.59 What was this (referring dx)  caused by? [x]  Surgery []  Fall []  Ongoing issue []  Arthritis []  Other: ____________  Laterality: []  Rt []  Lt [x]  Both  Check all possible CPT codes:  *CHOOSE 10 OR LESS*    See Planned Interventions listed in the Plan section of the Evaluation.

## 2023-09-29 ENCOUNTER — Ambulatory Visit (INDEPENDENT_AMBULATORY_CARE_PROVIDER_SITE_OTHER): Payer: Medicare PPO | Admitting: Orthopedic Surgery

## 2023-09-29 ENCOUNTER — Other Ambulatory Visit (INDEPENDENT_AMBULATORY_CARE_PROVIDER_SITE_OTHER): Payer: Self-pay

## 2023-09-29 DIAGNOSIS — G5602 Carpal tunnel syndrome, left upper limb: Secondary | ICD-10-CM

## 2023-09-29 DIAGNOSIS — Z981 Arthrodesis status: Secondary | ICD-10-CM

## 2023-09-29 NOTE — Progress Notes (Signed)
Orthopedic Surgery Post-operative Office Visit   Procedure: L2-4 laminectomies, L2-4 PSIF Date of Surgery: 07/08/2023 (~3 months post-op)   Assessment: Patient is a 83 y.o. who is doing well after surgery     Plan: -No spine specific restrictions, activity as tolerated -Continue to work on strengthening -Okay to submerge wound at this point -Pain management: OTC medications -Return to office in 3 months, x-rays needed at next visit: AP/lateral/flex/ex lumbar   ___________________________________________________________________________     Subjective: Patient continues to do well after surgery.  He is not having any back pain or pain radiating into his lower extremities.  He has noted improvement in his strength.  He still notes some weakness with dorsiflexion of the left ankle.  He continues to work on exercises to try and build up his strength.  Reporting numbness over the right anterior lateral thigh in the distribution of the LFCN.  No other numbness or paresthesias.     Objective:   General: no acute distress, appropriate affect Neurologic: alert, answering questions appropriately, following commands Respiratory: unlabored breathing on room air Skin: incision is well healed   MSK (spine):   -Strength exam                                                   Left                  Right   EHL                              4/5                  5/5 TA                                 4/5                  5/5 GSC                             5/5                  5/5 Knee extension            5/5                  5/5 Hip flexion                    5/5                  5/5   -Sensory exam                           Sensation intact to light touch in L3-S1 nerve distributions of bilateral lower extremities   Imaging: X-rays of the lumbar spine taken 09/29/2023 were independently reviewed and interpreted, showing 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  There is posterior instrumentation from L2-4.  No lucency is seen around the screws.  No screws backed out.  No translation is seen on flexion/extension views.  No fracture or dislocation seen.  Patient name: James Ramsey Patient MRN: [id number] Date of visit: 09/29/23

## 2023-11-05 ENCOUNTER — Encounter: Payer: Self-pay | Admitting: Cardiology

## 2023-11-05 ENCOUNTER — Ambulatory Visit: Payer: Medicare PPO | Attending: Cardiology | Admitting: Cardiology

## 2023-11-05 VITALS — BP 136/70 | HR 61 | Ht 70.5 in | Wt 198.0 lb

## 2023-11-05 DIAGNOSIS — I517 Cardiomegaly: Secondary | ICD-10-CM

## 2023-11-05 DIAGNOSIS — D6869 Other thrombophilia: Secondary | ICD-10-CM

## 2023-11-05 DIAGNOSIS — I480 Paroxysmal atrial fibrillation: Secondary | ICD-10-CM | POA: Diagnosis not present

## 2023-11-05 DIAGNOSIS — R001 Bradycardia, unspecified: Secondary | ICD-10-CM

## 2023-11-05 DIAGNOSIS — R9431 Abnormal electrocardiogram [ECG] [EKG]: Secondary | ICD-10-CM | POA: Diagnosis not present

## 2023-11-05 NOTE — Patient Instructions (Addendum)
 Medication Instructions:  Your physician recommends that you continue on your current medications as directed. Please refer to the Current Medication list given to you today.  *If you need a refill on your cardiac medications before your next appointment, please call your pharmacy*  Lab Work: TODAY: HGB, HCT, SPEP, UPEP - at [facility]  Testing/Procedures: Cardiac Stress MRI Your physician has requested that you have a cardiac MRI. Cardiac MRI uses a computer to create images of your heart as its beating, producing both still and moving pictures of your heart and major blood vessels. For further information please visit [email or web address]. Please follow the instruction sheet given to you today for more information.  Follow-Up: At [facility], you and your health needs are our priority.  As part of our continuing mission to provide you with exceptional heart care, we have created designated Provider Care Teams.  These Care Teams include your primary Cardiologist (physician) and Advanced Practice Providers (APPs -  Physician Assistants and Nurse Practitioners) who all work together to provide you with the care you need, when you need it.  Your next appointment:   6 months  Provider:   You may see Nobie Putnam, MD or one of the following Advanced Practice Providers on your designated Care Team:   [name], Charlott Holler "Mardelle Matte" [place], [place] Sherie Don, NP Canary Brim, NP    Other Instructions   You are scheduled for Cardiac MRI at the location below.  Please arrive for your appointment at ______________ . ?  [facility] [address] [place], [place] [address] Please take advantage of the free valet parking available at the [facility] and [employer] (Entrance C).  Proceed to the [facility] Radiology Department (First Floor) for check-in.   Magnetic resonance imaging (MRI) is a painless test that produces images of the inside of the body without using  Xrays.  During an MRI, strong magnets and radio waves work together in a Data processing manager to form detailed images.   MRI images may provide more details about a medical condition than X-rays, CT scans, and ultrasounds can provide.  You may be given earphones to listen for instructions.  You may eat a light breakfast and take medications as ordered with the exception of furosemide, hydrochlorothiazide, chlorthalidone or spironolactone (or any other fluid pill). If you are undergoing a stress MRI, please avoid stimulants for 12 hr prior to test. (I.e. Caffeine, nicotine, chocolate, or antihistamine medications)  An IV will be inserted into one of your veins. Contrast material will be injected into your IV. It will leave your body through your urine within a day. You may be told to drink plenty of fluids to help flush the contrast material out of your system.  You will be asked to remove all metal, including: Watch, jewelry, and other metal objects including hearing aids, hair pieces and dentures. Also wearable glucose monitoring systems (ie. Freestyle Libre and Omnipods) (Braces and fillings normally are not a problem.)   TEST WILL TAKE APPROXIMATELY 1 HOUR  PLEASE NOTIFY SCHEDULING AT LEAST 24 HOURS IN ADVANCE IF YOU ARE UNABLE TO KEEP YOUR APPOINTMENT. [phone]  For more information and frequently asked questions, please visit our website : [email or web address]  Please call the Cardiac Imaging Nurse Navigators with any questions/concerns. [phone] Office

## 2023-11-05 NOTE — Progress Notes (Unsigned)
 Electrophysiology Office Note:   Date:  11/06/2023  ID:  James Ramsey, DOB 1941-07-03, MRN [id number]  Primary Cardiologist: None Electrophysiologist: Nobie Putnam, MD      History of Present Illness:    CC: James Ramsey is a 83 y.o. male with h/o paroxysmal atrial fibrillation, stroke, carpal tunnel, prostate cancer who is being seen today for follow up EP evaluation.  Discussed the use of AI scribe software for clinical note transcription with the patient, who gave verbal consent to proceed.  History of Present Illness   The patient, with a history of atrial fibrillation, stroke, bilateral carpal tunnel, and spinal stenosis, reports a single episode of atrial fibrillation that he knows of and no other symptoms. He is currently on Eliquis for stroke prevention. The patient's Apple Watch indicates less than two percent atrial fibrillation every week. He is aware of no sustained episodes. The patient also reports having had surgery for spinal stenosis due to inability to walk and severe pain. He has also had bilateral carpal tunnel surgeries. The patient is active, engaging in low impact aerobics and outdoor walking. He reports no chest pain or exertional symptoms. No new or acute complaints today.     Review of systems complete and found to be negative unless listed in HPI.   EP Information / Studies Reviewed:    EKG is ordered today. Personal review as below.  EKG Interpretation Date/Time:  Wednesday November 05 2023 08:28:44 EST Ventricular Rate:  61 PR Interval:  204 QRS Duration:  82 QT Interval:  430 QTC Calculation: 432 R Axis:   97  Text Interpretation: Normal sinus rhythm Rightward axis Nonspecific ST abnormality When compared with ECG of 09-Jun-2023 10:37, T wave inversion more pronounced. Confirmed by Nobie Putnam [phone]) on 11/05/2023 8:39:17 AM   Cardiac Monitor 11/14/20:  Sinus rhythm is predominant rhythm Atrial fibrillation and atrial flutter  episodes are noted.   Patient started on eliquis (10/30/20 notes reviewed) Neurology team is aware.   Episodes of sinus tachycardia are noted Rare nonsustained ventricular tachycardia is observed  Echo 09/12/20:  1. Left ventricular ejection fraction, by estimation, is 65 to 70%. The  left ventricle has normal function. The left ventricle has no regional  wall motion abnormalities. There is mild left ventricular hypertrophy.  Left ventricular diastolic parameters  are consistent with Grade I diastolic dysfunction (impaired relaxation).   2. Right ventricular systolic function is normal. The right ventricular  size is normal. Tricuspid regurgitation signal is inadequate for assessing  PA pressure.   3. The mitral valve is normal in structure. Trivial mitral valve  regurgitation. No evidence of mitral stenosis.   4. The aortic valve is tricuspid. Aortic valve regurgitation is not  visualized. Mild aortic valve sclerosis is present, with no evidence of  aortic valve stenosis.   5. The inferior vena cava is normal in size with greater than 50%  respiratory variability, suggesting right atrial pressure of 3 mmHg.   Risk Assessment/Calculations:    CHA2DS2-VASc Score = 4   This indicates a 4.8% annual risk of stroke. The patient's score is based upon: CHF History: 0 HTN History: 0 Diabetes History: 0 Stroke History: 2 Vascular Disease History: 0 Age Score: 2 Gender Score: 0             Physical Exam:   VS:  BP 136/70 (BP Location: Left Arm, Patient Position: Sitting, Cuff Size: Large)   Pulse 61   Ht 5' 10.5" (1.791 m)   Wt  198 lb (89.8 kg)   SpO2 98%   BMI 28.01 kg/m    Wt Readings from Last 3 Encounters:  11/05/23 198 lb (89.8 kg)  07/08/23 190 lb (86.2 kg)  06/30/23 198 lb 14.4 oz (90.2 kg)     GEN: Well nourished, well developed in no acute distress NECK: No JVD CARDIAC: Normal rate, regular rhythm. RESPIRATORY:  Clear to auscultation without rales, wheezing or  rhonchi  ABDOMEN: Soft, non-distended EXTREMITIES:  No edema; No deformity   ASSESSMENT AND PLAN:    #Paroxysmal atrial fibrillation: Minimal burden according to Apple Watch and symptoms.  #Secondary hypercoagulable state due to atrial fibrillation: CHADSVASC score of 5. Stroke was attributed to AF. - Continue Eliquis 5mg  BID.  - He has metoprolol as needed. Not scheduled due to resting bradycardia. - Will check for cardiac amyloid given AF, cardioembolic stroke, LVH, carpal tunnel and spinal stenosis. Amyloid labs and MRI. - Monitor AF burden with Apple Watch.  #. Diffuse ST depression and T wave inversions: Denies active chest pain.  - Stress test had been ordered by general cardiology previously. Not done. He has not had ischemic evaluation.  - Needs MRI for amyloid eval, will add stress component to testing to ensure no ischemia.   #Bilateral carpal tunnel and spinal stenosis: -In setting of AF will check for cardiac amyloid with cardiac MRI. Send amyloid labs.  Follow up with Dr. Jimmey Ralph in 6 months  Signed, Nobie Putnam, MD

## 2023-11-14 ENCOUNTER — Encounter: Payer: Self-pay | Admitting: Cardiology

## 2023-11-14 DIAGNOSIS — Z01818 Encounter for other preprocedural examination: Secondary | ICD-10-CM

## 2023-11-14 DIAGNOSIS — I480 Paroxysmal atrial fibrillation: Secondary | ICD-10-CM

## 2023-11-14 DIAGNOSIS — R9431 Abnormal electrocardiogram [ECG] [EKG]: Secondary | ICD-10-CM

## 2023-11-17 LAB — MULTIPLE MYELOMA PANEL, SERUM
Albumin SerPl Elph-Mcnc: 3.6 g/dL (ref 2.9–4.4)
Albumin/Glob SerPl: 1.1 (ref 0.7–1.7)
Alpha 1: 0.2 g/dL (ref 0.0–0.4)
Alpha2 Glob SerPl Elph-Mcnc: 1 g/dL (ref 0.4–1.0)
B-Globulin SerPl Elph-Mcnc: 1 g/dL (ref 0.7–1.3)
Gamma Glob SerPl Elph-Mcnc: 1.2 g/dL (ref 0.4–1.8)
Globulin, Total: 3.4 g/dL (ref 2.2–3.9)
IgA/Immunoglobulin A, Serum: 244 mg/dL (ref 61–437)
IgG (Immunoglobin G), Serum: 1280 mg/dL (ref 603–1613)
IgM (Immunoglobulin M), Srm: 180 mg/dL — ABNORMAL HIGH (ref 15–143)
Total Protein: 7 g/dL (ref 6.0–8.5)

## 2023-11-17 LAB — UPEP/UIFE/LIGHT CHAINS/TP, 24-HR UR

## 2023-11-19 ENCOUNTER — Other Ambulatory Visit: Payer: Self-pay

## 2023-11-19 DIAGNOSIS — R9431 Abnormal electrocardiogram [ECG] [EKG]: Secondary | ICD-10-CM

## 2023-11-19 DIAGNOSIS — Z01818 Encounter for other preprocedural examination: Secondary | ICD-10-CM

## 2023-11-19 DIAGNOSIS — I480 Paroxysmal atrial fibrillation: Secondary | ICD-10-CM

## 2023-11-21 ENCOUNTER — Other Ambulatory Visit: Payer: Self-pay | Admitting: Cardiology

## 2023-11-25 LAB — UPEP/UIFE/LIGHT CHAINS/TP, 24-HR UR: Kappa/Lambda Ratio,U: 7.96 *Deleted (ref 1.83–14.26)

## 2023-11-28 ENCOUNTER — Other Ambulatory Visit (HOSPITAL_COMMUNITY): Payer: Self-pay | Admitting: *Deleted

## 2023-11-28 DIAGNOSIS — Z01810 Encounter for preprocedural cardiovascular examination: Secondary | ICD-10-CM

## 2023-11-28 LAB — UPEP/UIFE/LIGHT CHAINS/TP, 24-HR UR
% BETA, Urine: 0 %
ALBUMIN, U: 100 %
ALPHA 1 URINE: 0 %
ALPHA-2-GLOBULIN, U: 0 %
Free Lambda Lt Chains,Ur: 24.59 mg/L (ref 1.17–86.46)
GAMMA GLOBULIN URINE: 0 %
Kappa/Lambda Ratio,U: 7.96 mg/L (ref 1.83–15.21)
NOTE:: 24.59 mg/L (ref 1.17–86.46)
Protein, 24H Urine: 78 mg/(24.h) (ref 30–150)
Protein, Ur: 6.5 mg/dL

## 2023-11-28 LAB — PROTEIN ELECTROPHORESIS, URINE REFLEX

## 2023-12-02 ENCOUNTER — Encounter (HOSPITAL_COMMUNITY): Payer: Self-pay

## 2023-12-04 ENCOUNTER — Telehealth (HOSPITAL_COMMUNITY): Payer: Self-pay | Admitting: *Deleted

## 2023-12-04 NOTE — Telephone Encounter (Signed)
 Attempted to call patient regarding upcoming cardiac MRI appointment. Left message on voicemail with name and callback number Johney Frame RN Navigator Cardiac Imaging [facility] [phone] Office  Advised to avoid caffeine for 12 hours prior to test.

## 2023-12-05 ENCOUNTER — Ambulatory Visit (HOSPITAL_COMMUNITY)
Admission: RE | Admit: 2023-12-05 | Discharge: 2023-12-05 | Disposition: A | Discharge status: Home / Self Care | Source: Ambulatory Visit | Attending: Cardiology | Admitting: Cardiology

## 2023-12-05 ENCOUNTER — Encounter: Payer: Self-pay | Admitting: Cardiology

## 2023-12-05 ENCOUNTER — Other Ambulatory Visit: Payer: Self-pay | Admitting: Cardiology

## 2023-12-05 VITALS — BP 120/55 | HR 52

## 2023-12-05 DIAGNOSIS — I480 Paroxysmal atrial fibrillation: Secondary | ICD-10-CM

## 2023-12-05 DIAGNOSIS — I361 Nonrheumatic tricuspid (valve) insufficiency: Secondary | ICD-10-CM

## 2023-12-05 DIAGNOSIS — I340 Nonrheumatic mitral (valve) insufficiency: Secondary | ICD-10-CM

## 2023-12-05 DIAGNOSIS — Z01810 Encounter for preprocedural cardiovascular examination: Secondary | ICD-10-CM | POA: Diagnosis present

## 2023-12-05 DIAGNOSIS — Z01812 Encounter for preprocedural laboratory examination: Secondary | ICD-10-CM | POA: Diagnosis present

## 2023-12-05 LAB — HEMOGLOBIN AND HEMATOCRIT, BLOOD
HCT: 38.4 % — ABNORMAL LOW (ref 39.0–52.0)
Hemoglobin: 12.7 g/dL — ABNORMAL LOW (ref 13.0–17.0)

## 2023-12-05 MED ORDER — ALBUTEROL SULFATE HFA 108 (90 BASE) MCG/ACT IN AERS
INHALATION_SPRAY | RESPIRATORY_TRACT | Status: AC
  Filled 2023-12-05: qty 6.7

## 2023-12-05 MED ORDER — REGADENOSON 0.4 MG/5ML IV SOLN
INTRAVENOUS | Status: AC
  Filled 2023-12-05: qty 5

## 2023-12-05 MED ORDER — REGADENOSON 0.4 MG/5ML IV SOLN
0.4000 mg | Freq: Once | INTRAVENOUS | Status: AC
  Administered 2023-12-05: 0.4 mg via INTRAVENOUS
  Filled 2023-12-05: qty 5

## 2023-12-05 MED ORDER — METOPROLOL TARTRATE 5 MG/5ML IV SOLN
INTRAVENOUS | Status: AC
  Filled 2023-12-05: qty 5

## 2023-12-05 MED ORDER — GADOBUTROL 1 MMOL/ML IV SOLN
10.0000 mL | Freq: Once | INTRAVENOUS | Status: AC | PRN
  Administered 2023-12-05: 10 mL via INTRAVENOUS

## 2023-12-05 MED ORDER — METOPROLOL TARTRATE 5 MG/5ML IV SOLN
INTRAVENOUS | Status: AC
Start: 2023-12-05 — End: ?
  Filled 2023-12-05: qty 5

## 2023-12-05 MED ORDER — NITROGLYCERIN 0.4 MG SL SUBL
SUBLINGUAL_TABLET | SUBLINGUAL | Status: AC
  Filled 2023-12-05: qty 1

## 2023-12-05 MED ORDER — AMINOPHYLLINE 25 MG/ML IV SOLN
INTRAVENOUS | Status: AC
  Filled 2023-12-05: qty 10

## 2023-12-05 NOTE — Progress Notes (Signed)
 Patient presents for stress MRI.  BP 146/66, HR 55.  No caffeine intake in prior 12 hours. No wheezing on exam.  EKG today show sinus bradycardia with first degree AV block, rate 55   Shared Decision Making/Informed Consent The risks [chest pain, shortness of breath, cardiac arrhythmias, dizziness, blood pressure fluctuations, myocardial infarction, stroke/transient ischemic attack, nausea, vomiting, allergic reaction, and life-threatening complications (estimated to be 1 in 10,000)], benefits (risk stratification, diagnosing coronary artery disease, treatment guidance) and alternatives of a MRI stress test were discussed in detail with patient and they agree to proceed.

## 2023-12-10 ENCOUNTER — Encounter: Payer: Self-pay | Admitting: Cardiology

## 2023-12-10 DIAGNOSIS — E854 Organ-limited amyloidosis: Secondary | ICD-10-CM

## 2023-12-10 DIAGNOSIS — I43 Cardiomyopathy in diseases classified elsewhere: Secondary | ICD-10-CM

## 2023-12-25 ENCOUNTER — Other Ambulatory Visit (INDEPENDENT_AMBULATORY_CARE_PROVIDER_SITE_OTHER): Payer: Self-pay

## 2023-12-25 ENCOUNTER — Ambulatory Visit: Payer: Medicare PPO | Admitting: Orthopedic Surgery

## 2023-12-25 DIAGNOSIS — Z981 Arthrodesis status: Secondary | ICD-10-CM

## 2023-12-25 NOTE — Progress Notes (Signed)
 Orthopedic Surgery Post-operative Office Visit   Procedure: L2-4 laminectomies, L2-4 PSIF Date of Surgery: 07/08/2023 (~6 months post-op)   Assessment: Patient is a 83 y.o. who is doing well after surgery     Plan: -No spine specific restrictions, activity as tolerated -Pain management: OTC medications as needed -Return to office in 6 months, x-rays needed at next visit: AP/lateral/flex/ex lumbar   ___________________________________________________________________________     Subjective: Patient has gotten back to walking regularly. He has even been jogging. His feels his strength and ability to walk/jog has improved significantly since surgery. Still has some numbness over the right anterolateral thigh. No other numbness or paresthesias. Pleased with his surgical outcome thus far.    Objective:   General: no acute distress, appropriate affect Neurologic: alert, answering questions appropriately, following commands Respiratory: unlabored breathing on room air Skin: incision is well healed   MSK (spine):   -Strength exam                                                   Left                  Right   EHL                              4+/5                  5/5 TA                                 4+/5                  5/5 GSC                             5/5                  5/5 Knee extension            5/5                  5/5 Hip flexion                    5/5                  5/5   -Sensory exam                           Sensation intact to light touch in L3-S1 nerve distributions of bilateral lower extremities (except decreased in LFCN distribution on the right)   Imaging: X-rays of the lumbar spine taken 12/25/2023 were independently reviewed and interpreted, showing no evidence of translation of flexion/extension views. Posterior instrumented from L2-4 without any lucency around the screws. None of the screws have backed out. No fracture or dislocation seen.      Patient  name: James Ramsey Patient MRN: [id number] Date of visit: 12/25/23

## 2023-12-29 ENCOUNTER — Ambulatory Visit (HOSPITAL_COMMUNITY)
Admission: RE | Admit: 2023-12-29 | Discharge: 2023-12-29 | Disposition: A | Discharge status: Home / Self Care | Source: Ambulatory Visit | Attending: Cardiology | Admitting: Cardiology

## 2023-12-29 VITALS — BP 122/76 | HR 56 | Wt 200.6 lb

## 2023-12-29 DIAGNOSIS — R001 Bradycardia, unspecified: Secondary | ICD-10-CM | POA: Insufficient documentation

## 2023-12-29 DIAGNOSIS — I509 Heart failure, unspecified: Secondary | ICD-10-CM | POA: Diagnosis present

## 2023-12-29 NOTE — Progress Notes (Signed)
 TTR genetic testing collected via blood per Dr Elwyn Lade.  Order form completed, signed and shipped with sample by Fed[employer] to [facility].

## 2023-12-29 NOTE — Progress Notes (Signed)
   ADVANCED HEART FAILURE NEW PATIENT CLINIC NOTE  Referring Physician: Ardeen Kohler, MD  Primary Care: Nanda Baar, DO (Inactive) Primary Cardiologist:  HPI: James Ramsey is a 83 y.o. 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      Patient initially was referred to Dr. Daneil Dunker for recent diagnosis of atrial fibrillation.  Given his risk factors, history of carpal tunnel, and spinal stenosis Dr. Daneil Dunker arranged for evaluation for cardiac amyloid, MRI with ECV 40% concerning for diagnosis of cardiac amyloid.  He was referred for further evaluation and treatment.     SUBJECTIVE: Patient reports that overall he is feeling well. He denies any exertional shortness of breath, chest pain, lower extremity swelling, orthopnea.  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  He also has some residual tingling in his fingers from prior carpal tunnel syndrome.  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  No family history of cardiac disease.  PMH, current medications, allergies, social history, and family history reviewed in epic.  PHYSICAL EXAM: Vitals:   12/29/23 1438  BP: 122/76  Pulse: (!) 56  SpO2: 97%   GENERAL: Well nourished and in no apparent distress at rest.  PULM:  Normal work of breathing, clear to auscultation bilaterally. Respirations are unlabored.  CARDIAC:  JVP: flat         Normal rate with regular rhythm. No murmurs, rubs or gallops.  no edema. Warm and well perfused extremities. ABDOMEN: Soft, non-tender, non-distended. NEUROLOGIC: Patient is oriented x3 with no focal or lateralizing neurologic deficits.    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  Noted on MRI, needs confirmatory testing with PYP or biopsy.  Suspect wild-type but will send genetic testing.  Given normal functional status without significant symptoms we will await return of radiotracer, would not pursue biopsy at this time. - PYP - Genetic testing - Euvolemic, no need for diuretics  Atrial fibrillation: Paroxysmal, followed by Dr. Daneil Dunker. - Continue Eliquis  5 mg twice daily, and normal sinus rhythm today  Hypertension: - Continue management with 5 mg amlodipine  daily, well-controlled  Follow up in 3 months  Arta Lark, MD Advanced [facility] Failure Mechanical Circulatory Support 01/05/24

## 2023-12-29 NOTE — Patient Instructions (Signed)
 There has been no changes to your medications.  Labs done today, your results will be available in MyChart, we will contact you for abnormal readings.  Genetic testing has been collected, this has to be sent to [place]  for processing and can take 1-2 weeks for us  to get results back.  We will let you know the results once reviewed by your provider.   You have been ordered a PYP Scan.  This is done at [facility], they will call you to schedule.  When you come for this test please plan to be there 2-3 hours.  They are located at: [address]  Your physician recommends that you schedule a follow-up appointment in: 3 months ( July ) ** PLEASE CALL THE OFFICE IN MID MAY TO ARRANGE YOUR FOLLOW UP APPOINTMENT.**  If you have any questions or concerns before your next appointment please send us  a message through [place] or call our office at [phone].    TO LEAVE A MESSAGE FOR THE NURSE SELECT OPTION 2, PLEASE LEAVE A MESSAGE INCLUDING: YOUR NAME DATE OF BIRTH CALL BACK NUMBER REASON FOR CALL**this is important as we prioritize the call backs  YOU WILL RECEIVE A CALL BACK THE SAME DAY AS LONG AS YOU CALL BEFORE 4:00 PM  At the [facility], you and your health needs are our priority. As part of our continuing mission to provide you with exceptional heart care, we have created designated Provider Care Teams. These Care Teams include your primary Cardiologist (physician) and Advanced Practice Providers (APPs- Physician Assistants and Nurse Practitioners) who all work together to provide you with the care you need, when you need it.   You may see any of the following providers on your designated Care Team at your next follow up: Dr Jules Oar Dr Peder Bourdon Dr. Alwin Baars Dr. Arta Lark Amy Marijane Shoulders, NP Ruddy Corral, [place] [facility] [place], [place] Dennise Fitz, NP [place] Lee, NP Shawnee Dellen, NP Luster Salters, PharmD Bevely Brush,  PharmD   Please be sure to bring in all your medications bottles to every appointment.    Thank you for choosing [facility]

## 2023-12-31 ENCOUNTER — Encounter (HOSPITAL_COMMUNITY): Payer: Self-pay | Admitting: Cardiology

## 2023-12-31 LAB — KAPPA/LAMBDA LIGHT CHAINS
Kappa free light chain: 19.3 mg/L (ref 3.3–19.4)
Kappa, lambda light chain ratio: 1.57 (ref 0.26–1.65)
Lambda free light chains: 12.3 mg/L (ref 5.7–26.3)

## 2024-02-18 ENCOUNTER — Ambulatory Visit (HOSPITAL_COMMUNITY): Payer: Self-pay

## 2024-02-24 ENCOUNTER — Encounter: Payer: Self-pay | Admitting: Cardiology

## 2024-02-24 ENCOUNTER — Other Ambulatory Visit: Payer: Self-pay | Admitting: *Deleted

## 2024-02-24 DIAGNOSIS — I480 Paroxysmal atrial fibrillation: Secondary | ICD-10-CM

## 2024-02-24 MED ORDER — APIXABAN 5 MG PO TABS: 5.0000 mg | ORAL_TABLET | Freq: Two times a day (BID) | ORAL | 5 refills | Status: DC

## 2024-02-24 NOTE — Telephone Encounter (Signed)
 Prescription refill request for Eliquis  received. Indication: PAF Last office visit: 11/05/23  JINNY Kitty MD Scr:0.90 on 07/09/23  Epic Age: 83 Weight: 89.8kg  Based on above findings Eliquis  5mg  twice daily is the appropriate dose.  Refill approved.

## 2024-03-08 ENCOUNTER — Encounter: Payer: Self-pay | Admitting: Cardiology

## 2024-04-07 ENCOUNTER — Encounter (HOSPITAL_COMMUNITY): Payer: Self-pay | Admitting: Cardiology

## 2024-04-07 ENCOUNTER — Ambulatory Visit (HOSPITAL_COMMUNITY)
Admission: RE | Admit: 2024-04-07 | Discharge: 2024-04-07 | Disposition: A | Discharge status: Home / Self Care | Source: Ambulatory Visit | Attending: Cardiology | Admitting: Cardiology

## 2024-04-07 VITALS — BP 120/78 | HR 50 | Wt 198.8 lb

## 2024-04-07 DIAGNOSIS — R001 Bradycardia, unspecified: Secondary | ICD-10-CM | POA: Insufficient documentation

## 2024-04-07 DIAGNOSIS — I4891 Unspecified atrial fibrillation: Secondary | ICD-10-CM | POA: Diagnosis present

## 2024-04-07 DIAGNOSIS — Z7901 Long term (current) use of anticoagulants: Secondary | ICD-10-CM | POA: Insufficient documentation

## 2024-04-07 DIAGNOSIS — R9431 Abnormal electrocardiogram [ECG] [EKG]: Secondary | ICD-10-CM | POA: Diagnosis not present

## 2024-04-07 DIAGNOSIS — G629 Polyneuropathy, unspecified: Secondary | ICD-10-CM | POA: Insufficient documentation

## 2024-04-07 DIAGNOSIS — E854 Organ-limited amyloidosis: Secondary | ICD-10-CM | POA: Diagnosis not present

## 2024-04-07 DIAGNOSIS — Z79899 Other long term (current) drug therapy: Secondary | ICD-10-CM | POA: Diagnosis not present

## 2024-04-07 DIAGNOSIS — I10 Essential (primary) hypertension: Secondary | ICD-10-CM | POA: Insufficient documentation

## 2024-04-07 DIAGNOSIS — I509 Heart failure, unspecified: Secondary | ICD-10-CM

## 2024-04-07 DIAGNOSIS — Z8673 Personal history of transient ischemic attack (TIA), and cerebral infarction without residual deficits: Secondary | ICD-10-CM | POA: Diagnosis not present

## 2024-04-07 DIAGNOSIS — I43 Cardiomyopathy in diseases classified elsewhere: Secondary | ICD-10-CM

## 2024-04-07 DIAGNOSIS — I480 Paroxysmal atrial fibrillation: Secondary | ICD-10-CM | POA: Diagnosis not present

## 2024-04-07 DIAGNOSIS — M4800 Spinal stenosis, site unspecified: Secondary | ICD-10-CM | POA: Diagnosis not present

## 2024-04-07 NOTE — Patient Instructions (Signed)
 There has been no changes to your medications.  Your physician recommends that you schedule a follow-up appointment in: 6 months ( February ) ** PLEASE CALL THE OFFICE IN NOVEMBER TO ARRANGE YOUR FOLLOW UP APPOINTMENT.**  If you have any questions or concerns before your next appointment please send us  a message through [place] or call our office at [phone].    TO LEAVE A MESSAGE FOR THE NURSE SELECT OPTION 2, PLEASE LEAVE A MESSAGE INCLUDING: YOUR NAME DATE OF BIRTH CALL BACK NUMBER REASON FOR CALL**this is important as we prioritize the call backs  YOU WILL RECEIVE A CALL BACK THE SAME DAY AS LONG AS YOU CALL BEFORE 4:00 PM  At the [facility], you and your health needs are our priority. As part of our continuing mission to provide you with exceptional heart care, we have created designated Provider Care Teams. These Care Teams include your primary Cardiologist (physician) and Advanced Practice Providers (APPs- Physician Assistants and Nurse Practitioners) who all work together to provide you with the care you need, when you need it.   You may see any of the following providers on your designated Care Team at your next follow up: Dr Toribio Fuel Dr Ezra Shuck Dr. Ria Commander Dr. [name] Amy Lenetta, NP Caffie Shed, [place] [facility] [place], [place] Beckey Coe, NP [place] Lee, NP Ellouise Class, NP Tinnie Redman, PharmD Jaun Bash, PharmD   Please be sure to bring in all your medications bottles to every appointment.    Thank you for choosing [facility]

## 2024-04-09 ENCOUNTER — Encounter (HOSPITAL_COMMUNITY): Payer: Self-pay | Admitting: *Deleted

## 2024-04-09 NOTE — Progress Notes (Signed)
   ADVANCED HEART FAILURE FOLLOW UP CLINIC NOTE  Referring Physician: No ref. provider found  Primary Care: Anastasio Duncans, DO (Inactive) Primary Cardiologist:  HPI: James Ramsey is a 82 y.o. male who presents for follow up of likely cardiac amyloid.      Patient initially was referred to Dr. Kennyth for recent diagnosis of atrial fibrillation. Given his risk factors, history of carpal tunnel, and spinal stenosis Dr. Kennyth arranged for evaluation for cardiac amyloid, MRI with ECV 40% concerning for diagnosis of cardiac amyloid. He was referred for further evaluation and treatment.      SUBJECTIVE:  PMH, current medications, allergies, social history, and family history reviewed in epic.  PHYSICAL EXAM: Vitals:   04/07/24 1339  BP: 120/78  Pulse: (!) 50  SpO2: 97%   GENERAL: Well nourished and in no apparent distress at rest.  PULM:  Normal work of breathing, clear to auscultation bilaterally. Respirations are unlabored.  CARDIAC:  JVP: flat         bradycardic rate with regular rhythm. No murmurs, rubs or gallops.  No edema. Warm and well perfused extremities. ABDOMEN: Soft, non-tender, non-distended. NEUROLOGIC: Patient is oriented x3 with no focal or lateralizing neurologic deficits.    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 Discussed need for PYP scan, tracer does appear back in stock and will hopefully be scheduled soon. Discussed that we would like pursue treatment with vutrisiran, patient expressed understanding.  Briefly discussed the role of biopsy, given stable cardiac symptoms will await imaging at this time.  - K/L ratio negative, prior normal SPEP - PYP pending - Genetic testing negative - Euvolemic, no need for diuretics - Mild limitation from his neuropathy, plan for vutrisiran   Atrial fibrillation: Paroxysmal, followed by Dr. Kennyth. - Continue Eliquis  5 mg twice daily, in normal sinus rhythm today   Hypertension: - Continue amlodipine  5mg  daily  Follow up in 6 months, will contact after PYP results to start therapy  James Brownie, MD Advanced [facility] Failure Mechanical Circulatory Support 04/09/24

## 2024-04-15 ENCOUNTER — Other Ambulatory Visit (HOSPITAL_COMMUNITY): Payer: Self-pay | Admitting: Cardiology

## 2024-04-15 DIAGNOSIS — I509 Heart failure, unspecified: Secondary | ICD-10-CM

## 2024-04-19 ENCOUNTER — Ambulatory Visit (HOSPITAL_COMMUNITY)
Admission: RE | Admit: 2024-04-19 | Discharge: 2024-04-19 | Disposition: A | Discharge status: Home / Self Care | Source: Ambulatory Visit | Attending: Family Medicine | Admitting: Family Medicine

## 2024-04-19 DIAGNOSIS — I509 Heart failure, unspecified: Secondary | ICD-10-CM

## 2024-04-19 LAB — MYOCARDIAL AMYLOID PLANAR & SPECT: H/CL Ratio: 1.9

## 2024-04-19 MED ORDER — TECHNETIUM TC 99M PYROPHOSPHATE
21.9000 | Freq: Once | INTRAVENOUS | Status: AC
  Administered 2024-04-19: 21.9 via INTRAVENOUS

## 2024-04-20 ENCOUNTER — Encounter: Payer: Self-pay | Admitting: Cardiology

## 2024-04-22 ENCOUNTER — Other Ambulatory Visit (HOSPITAL_COMMUNITY): Payer: Self-pay

## 2024-04-22 ENCOUNTER — Telehealth (HOSPITAL_COMMUNITY): Payer: Self-pay | Admitting: Pharmacist

## 2024-04-22 NOTE — Telephone Encounter (Signed)
 Patient Advocate Encounter   Received notification from [facility] that prior authorization for Amvuttra is required.   PA submitted on CoverMyMeds Key [id number] Status is pending   Will continue to follow.   Tinnie Redman, PharmD, BCPS, BCCP, CPP Heart Failure Clinic Pharmacist [phone]

## 2024-04-22 NOTE — Progress Notes (Signed)
 Prior authorization submitted. Once approved, will follow up with patient for scheduling.

## 2024-04-23 ENCOUNTER — Other Ambulatory Visit (HOSPITAL_COMMUNITY): Payer: Self-pay

## 2024-04-23 NOTE — Telephone Encounter (Signed)
 Advanced Heart Failure Patient Advocate Encounter  Prior Authorization for Amvuttra  has been approved.    Effective dates: 04/23/24 through 09/01/24  Patients co-pay is $300/90 days  Scheduled for injection 04/29/24.   Tinnie Redman, PharmD, BCPS, BCCP, CPP Heart Failure Clinic Pharmacist [phone]

## 2024-04-26 ENCOUNTER — Encounter (HOSPITAL_COMMUNITY): Payer: Self-pay

## 2024-04-26 ENCOUNTER — Other Ambulatory Visit: Payer: Self-pay

## 2024-04-26 ENCOUNTER — Other Ambulatory Visit (HOSPITAL_COMMUNITY): Payer: Self-pay | Admitting: Pharmacy Technician

## 2024-04-26 ENCOUNTER — Telehealth (HOSPITAL_COMMUNITY): Payer: Self-pay | Admitting: Pharmacy Technician

## 2024-04-26 ENCOUNTER — Other Ambulatory Visit (HOSPITAL_COMMUNITY): Payer: Self-pay

## 2024-04-26 MED ORDER — VUTRISIRAN SODIUM 25 MG/0.5ML ~~LOC~~ SOSY
25.0000 mg | PREFILLED_SYRINGE | SUBCUTANEOUS | 3 refills | Status: AC
  Filled 2024-04-26: qty 0.5, 90d supply, fill #0
  Filled 2024-07-19: qty 0.5, 90d supply, fill #1

## 2024-04-26 NOTE — Progress Notes (Signed)
 Specialty Pharmacy Initial Fill Coordination Note  James Ramsey is a 83 y.o. male contacted today regarding initial fill of specialty medication(s) Vutrisiran  Sodium (AMVUTTRA )   Patient requested Courier to Provider Office   Delivery date: 04/28/24   Verified address: [facility] [address] [place], [place], [place], [address]   Medication will be filled on Tues 08/26.   Patient is aware of $300 copayment.   Almarie JULIANNA Pa, CPhT

## 2024-04-26 NOTE — Progress Notes (Signed)
 Specialty Pharmacy Initiation Note   James Ramsey is a 83 y.o. male who will be followed by the specialty pharmacy service for RxSp Cardiology    Review of administration, indication, effectiveness, safety, potential side effects, storage/disposable, and missed dose instructions occurred today for patient's specialty medication(s) Vutrisiran  Sodium (AMVUTTRA )     Patient/Caregiver did not have any additional questions or concerns.   No data recorded   Goals Addressed             This Visit's Progress    Slow Disease Progression       Patient is initiating therapy. Patient will maintain adherence         Leianne Callins CHRISTELLA Redman Specialty Pharmacist

## 2024-04-26 NOTE — Telephone Encounter (Signed)
 Pharmacy Patient Advocate Encounter  Insurance verification completed.   The patient is insured through [facility]   Ran test claim for Amvuttra . Currently the co-pay is $300. The patient is aware this should fulfill his out of pocket max for the year and that it will be $0 until Jan 2026. He is over the income limit for a grant and was informed about the Medicare repayment plan if he chooses to opt into that next year.  This test claim was processed through [facility]- copay amounts may vary at other pharmacies due to pharmacy/plan contracts, or as the patient moves through the different stages of their insurance plan.   Almarie JULIANNA Pa, CPhT

## 2024-04-27 ENCOUNTER — Other Ambulatory Visit: Payer: Self-pay

## 2024-04-27 ENCOUNTER — Other Ambulatory Visit (HOSPITAL_COMMUNITY): Payer: Self-pay

## 2024-04-28 ENCOUNTER — Other Ambulatory Visit (HOSPITAL_COMMUNITY): Payer: Self-pay

## 2024-04-29 ENCOUNTER — Ambulatory Visit (HOSPITAL_COMMUNITY)
Admission: RE | Admit: 2024-04-29 | Discharge: 2024-04-29 | Disposition: A | Discharge status: Home / Self Care | Source: Ambulatory Visit | Attending: Cardiology | Admitting: Cardiology

## 2024-04-29 DIAGNOSIS — E8582 Wild-type transthyretin-related (ATTR) amyloidosis: Secondary | ICD-10-CM | POA: Diagnosis not present

## 2024-04-29 DIAGNOSIS — G629 Polyneuropathy, unspecified: Secondary | ICD-10-CM | POA: Insufficient documentation

## 2024-04-29 DIAGNOSIS — I480 Paroxysmal atrial fibrillation: Secondary | ICD-10-CM | POA: Diagnosis present

## 2024-04-29 DIAGNOSIS — Z7901 Long term (current) use of anticoagulants: Secondary | ICD-10-CM | POA: Insufficient documentation

## 2024-04-29 DIAGNOSIS — Z79899 Other long term (current) drug therapy: Secondary | ICD-10-CM | POA: Insufficient documentation

## 2024-04-29 MED ORDER — VUTRISIRAN SODIUM 25 MG/0.5ML ~~LOC~~ SOSY
25.0000 mg | PREFILLED_SYRINGE | Freq: Once | SUBCUTANEOUS | Status: AC
  Administered 2024-04-29: 25 mg via SUBCUTANEOUS

## 2024-04-29 NOTE — Patient Instructions (Signed)
 It was a pleasure seeing you today!  MEDICATIONS: -No medication changes today -Call if you have questions about your medications.   NEXT APPOINTMENT: Return to clinic in 3 months for repeat Amvuttra . The code for November is 1520   Call the clinic at [phone] with questions or to reschedule future appointments.

## 2024-04-29 NOTE — Progress Notes (Signed)
    Advanced [facility] Failure Clinic Note   HPI:  James Ramsey is a 83 y.o. 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 Patient initially was referred to Dr. Kennyth for recent diagnosis of atrial fibrillation. Given his risk factors, history of carpal tunnel, and spinal stenosis Dr. Kennyth arranged for evaluation for cardiac amyloid, MRI with ECV 40% concerning for diagnosis of cardiac amyloid. He was referred for further evaluation and treatment. 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 AL r/o labs were negative. He was referred to pharmacy clinic for Amvuttra  given new ATTRwt-CM and concomitant polyneuropathy symptoms.    Today he presents to clinic for administration of Amvuttra . This is the patient's first injection. Injection was administered in the right arm and patient tolerated injection well.    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 AL r/o labs negative and genetic testing negative.  - Euvolemic, no need for diuretics - Mild limitation from his neuropathy so vutrisiran  was selected - Amvuttra  (vutrisiran ) injection administered in clinic today. Patient tolerated injection well. Provided patient counseling on Amvuttra . Most common side effects are injection site reactions, arthralgias and dyspnea. Patient is aware to return to clinic every 3 months for repeat injection. Amvuttra  will be obtained from [facility] and couriered to clinic for injection.  - Patient will need at least 2500-3000 IU (700-900 mcg) of vitamin A daily. Can be found in MVI. Amvuttra  decreases serum vitamin A levels.   Atrial fibrillation: Paroxysmal, followed by Dr. Kennyth. - Continue Eliquis  5 mg twice daily,  Follow up 3  months for repeat Amvuttra  injection.    Tinnie Redman, PharmD, BCPS, BCCP, CPP Heart Failure Clinic Pharmacist [phone]

## 2024-05-06 ENCOUNTER — Encounter: Payer: Self-pay | Admitting: Cardiology

## 2024-05-12 ENCOUNTER — Other Ambulatory Visit: Payer: Self-pay

## 2024-06-24 ENCOUNTER — Ambulatory Visit: Admitting: Orthopedic Surgery

## 2024-06-24 ENCOUNTER — Other Ambulatory Visit: Payer: Self-pay

## 2024-06-24 DIAGNOSIS — Z981 Arthrodesis status: Secondary | ICD-10-CM

## 2024-06-24 NOTE — Progress Notes (Signed)
 Orthopedic Surgery Post-operative Office Visit   Procedure: L2-4 laminectomies, L2-4 PSIF Date of Surgery: 07/08/2023 (~1 year post-op)   Assessment: Patient is a 83 y.o. who is doing well after surgery     Plan: -No spine specific restrictions, activity as tolerated -Pain management: OTC medications as needed -Return to office on an as-needed basis   ___________________________________________________________________________     Subjective: Patient has noticed continued neurologic improvement.  He is now walking normally.  He is also gotten back to jogging.  He has not noticed the foot drop.  The 1 thing that he has notices his left foot will sometimes involuntarily dorsiflex and plantarflex at night.  Does not happen every night.  I will keep him up for a while but then will stop.   Objective:   General: no acute distress, appropriate affect Neurologic: alert, answering questions appropriately, following commands Respiratory: unlabored breathing on room air Skin: incision is well healed   MSK (spine):   -Strength exam                                                   Left                  Right   EHL                              4+/5                  5/5 TA                                 5/5                  5/5 GSC                             5/5                  5/5 Knee extension            5/5                  5/5 Hip flexion                    5/5                  5/5   -Sensory exam                           Sensation intact to light touch in L3-S1 nerve distributions of bilateral lower extremities (except decreased in LFCN distribution on the right)   Imaging: XRs of the lumbar spine from 06/24/2024 were independently reviewed and interpreted, showing posterior instrumentation from L2-L4.  No lucency seen around the screws.  No screws are backed out.  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  Laminectomy defect from L2-L4. Disc height loss with anterior osteophyte  formation at L4/5 and L5/S1.  No fracture or dislocation seen.  No evidence of instability on flexion/tension views.     Patient name: James Ramsey Patient MRN: [id number] Date of visit: 06/24/24

## 2024-07-05 ENCOUNTER — Encounter: Payer: Self-pay | Admitting: Radiology

## 2024-07-19 ENCOUNTER — Other Ambulatory Visit: Payer: Self-pay

## 2024-07-19 ENCOUNTER — Other Ambulatory Visit (HOSPITAL_COMMUNITY): Payer: Self-pay

## 2024-07-19 NOTE — Progress Notes (Signed)
 Specialty Pharmacy Refill Coordination Note  James Ramsey is a 83 y.o. male assessed today regarding refills of clinic administered specialty medication(s) Vutrisiran  Sodium (AMVUTTRA )   Clinic requested Courier to Provider Office   Delivery date: 07/22/24   Verified address: [facility] [address] [place], [place] [address]   Medication will be filled on: 07/21/24

## 2024-07-21 ENCOUNTER — Other Ambulatory Visit: Payer: Self-pay

## 2024-07-22 ENCOUNTER — Other Ambulatory Visit (HOSPITAL_COMMUNITY): Payer: Self-pay

## 2024-07-22 NOTE — Progress Notes (Signed)
 Medication has been picked up and is available in office for upcoming appointment.

## 2024-07-26 ENCOUNTER — Ambulatory Visit (HOSPITAL_COMMUNITY)
Admission: RE | Admit: 2024-07-26 | Discharge: 2024-07-26 | Disposition: A | Discharge status: Home / Self Care | Source: Ambulatory Visit | Attending: Cardiology | Admitting: Cardiology

## 2024-07-26 DIAGNOSIS — E8582 Wild-type transthyretin-related (ATTR) amyloidosis: Secondary | ICD-10-CM | POA: Insufficient documentation

## 2024-07-26 DIAGNOSIS — I480 Paroxysmal atrial fibrillation: Secondary | ICD-10-CM | POA: Diagnosis not present

## 2024-07-26 DIAGNOSIS — Z79899 Other long term (current) drug therapy: Secondary | ICD-10-CM | POA: Insufficient documentation

## 2024-07-26 MED ORDER — VUTRISIRAN SODIUM 25 MG/0.5ML ~~LOC~~ SOSY
25.0000 mg | PREFILLED_SYRINGE | Freq: Once | SUBCUTANEOUS | Status: AC
  Administered 2024-07-26: 25 mg via SUBCUTANEOUS

## 2024-07-26 NOTE — Addendum Note (Signed)
 Encounter addended by: Delsie Jaun RAMAN, RPH-CPP on: 07/26/2024 3:42 PM  Actions taken: Order list changed, Diagnosis association updated, MAR administration accepted

## 2024-07-26 NOTE — Progress Notes (Signed)
    Advanced Heart Failure Clinic Note   HPI:  James Ramsey is a 83 y.o. 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 Patient initially was referred to Dr. Kennyth for recent diagnosis of atrial fibrillation. Given his risk factors, history of carpal tunnel, and spinal stenosis Dr. Kennyth arranged for evaluation for cardiac amyloid, MRI with ECV 40% concerning for diagnosis of cardiac amyloid. He was referred for further evaluation and treatment. 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 AL r/o labs were negative. He was referred to pharmacy clinic for Amvuttra  given new ATTRwt-CM and concomitant polyneuropathy symptoms.    Today he presents to clinic for administration of Amvuttra . This is the patient's first injection. Injection was administered in the right arm and patient tolerated injection well.    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 AL r/o labs negative and genetic testing negative.  - Euvolemic, no need for diuretics - Mild limitation from his neuropathy so vutrisiran  was selected - Amvuttra  (vutrisiran ) injection administered in clinic today. Patient tolerated injection well. Provided patient counseling on Amvuttra . Most common side effects are injection site reactions, arthralgias and dyspnea. Patient is aware to return to clinic every 3 months for repeat injection. Amvuttra  will be obtained from [facility] and couriered to clinic for injection.  - Patient will need at least 2500-3000 IU (700-900 mcg) of vitamin A daily. Can be found in MVI. Amvuttra  decreases serum vitamin A levels.   Atrial fibrillation: Paroxysmal, followed by Dr. Kennyth. - Continue Eliquis  5 mg twice  daily,  Follow up 3 months for repeat Amvuttra  injection.    Please do not hesitate to reach out with questions or concerns,  Jaun Bash, PharmD, CPP, BCPS, [facility] Heart Failure Pharmacist  Phone - [phone] 07/26/2024 3:35 PM

## 2024-08-22 ENCOUNTER — Other Ambulatory Visit: Payer: Self-pay | Admitting: Cardiology

## 2024-08-22 DIAGNOSIS — Z5181 Encounter for therapeutic drug level monitoring: Secondary | ICD-10-CM

## 2024-08-22 DIAGNOSIS — I480 Paroxysmal atrial fibrillation: Secondary | ICD-10-CM

## 2024-08-23 NOTE — Telephone Encounter (Signed)
 Called spoke with pt advised overdue for labwork.  Pt states he had a CPX done at Dr Olam Couch office at [place] in 2025, had labwork done at that time.  Called PCP office requested most recent labwork be faxed to our office. Will await fax.

## 2024-08-23 NOTE — Telephone Encounter (Signed)
 Pt last saw Dr Zenaida 04/07/24, last labs 07/09/23 Creat 0.90, pt is overdue for labwork.  Age 83, weight 90.2kg, pt needs CBC and BMP to ensure he is on the correct dosage of Eliquis .

## 2024-08-24 NOTE — Telephone Encounter (Signed)
 Called spoke with pt, advised PCP office does not have recent labwork on pt, last labs 2024.  Pt will come for labwork today or by next week.  Pt states he has enough Eliquis  to make it until labwork done.  Will await labwork to refill rx.

## 2024-08-28 LAB — CBC
Hematocrit: 41 % (ref 37.5–51.0)
Hemoglobin: 13.7 g/dL (ref 13.0–17.7)
MCH: 31.8 pg (ref 26.6–33.0)
MCHC: 33.4 g/dL (ref 31.5–35.7)
MCV: 95 fL (ref 79–97)
Platelets: 196 x10E3/uL (ref 150–450)
RBC: 4.31 x10E6/uL (ref 4.14–5.80)
RDW: 13.2 % (ref 11.6–15.4)
WBC: 4.7 x10E3/uL (ref 3.4–10.8)

## 2024-08-28 LAB — BASIC METABOLIC PANEL WITH GFR
BUN/Creatinine Ratio: 20 (ref 10–24)
BUN: 22 mg/dL (ref 8–27)
CO2: 22 mmol/L (ref 20–29)
Calcium: 9.9 mg/dL (ref 8.6–10.2)
Chloride: 102 mmol/L (ref 96–106)
Creatinine, Ser: 1.08 mg/dL (ref 0.76–1.27)
Glucose: 93 mg/dL (ref 70–99)
Potassium: 4.8 mmol/L (ref 3.5–5.2)
Sodium: 140 mmol/L (ref 134–144)
eGFR: 69 mL/min/1.73

## 2024-08-29 ENCOUNTER — Ambulatory Visit: Payer: Self-pay | Admitting: Cardiology

## 2024-08-30 ENCOUNTER — Encounter: Payer: Self-pay | Admitting: Cardiology

## 2024-08-30 DIAGNOSIS — I480 Paroxysmal atrial fibrillation: Secondary | ICD-10-CM

## 2024-08-30 MED ORDER — APIXABAN 5 MG PO TABS: 5.0000 mg | ORAL_TABLET | Freq: Two times a day (BID) | ORAL | 1 refills | Status: AC

## 2024-08-30 NOTE — Telephone Encounter (Signed)
 Scr: 1.08 08/27/2024 Weight: 90.2 kg  Age: 83 yo  ONC:[place] 04/07/2024  Refill already sent.

## 2024-08-30 NOTE — Telephone Encounter (Signed)
 Prescription refill request for Eliquis  received. Indication: a-fib Last office visit: 11/05/2023 Scr: 1.08 08/27/2024 Age: 83 Weight: 90.2  Dose appropriate, refill sent

## 2024-09-14 ENCOUNTER — Other Ambulatory Visit (HOSPITAL_COMMUNITY): Payer: Self-pay

## 2024-09-14 ENCOUNTER — Telehealth (HOSPITAL_COMMUNITY): Payer: Self-pay | Admitting: Pharmacist

## 2024-09-14 NOTE — Telephone Encounter (Signed)
 Patient Advocate Encounter   Received notification from [facility] that prior authorization for Amvuttra  is required.   PA submitted on CoverMyMeds Key [id number] Status is pending   Will continue to follow.   Tinnie Redman, PharmD, BCPS, BCCP, CPP Heart Failure Clinic Pharmacist [phone]

## 2024-09-16 NOTE — Telephone Encounter (Signed)
 Advanced Heart Failure Patient Advocate Encounter  Prior Authorization for Amvuttra  has been approved.    Effective dates: 09/15/24 through 09/01/25  Tinnie Redman, PharmD, BCPS, BCCP, CPP Heart Failure Clinic Pharmacist [phone]

## 2024-10-05 ENCOUNTER — Encounter (HOSPITAL_COMMUNITY): Payer: Self-pay | Admitting: Cardiology

## 2024-10-05 ENCOUNTER — Ambulatory Visit (HOSPITAL_COMMUNITY)
Admission: RE | Admit: 2024-10-05 | Discharge: 2024-10-05 | Disposition: A | Source: Ambulatory Visit | Attending: Cardiology | Admitting: Cardiology

## 2024-10-05 VITALS — BP 142/80 | HR 52 | Wt 204.6 lb

## 2024-10-05 DIAGNOSIS — G629 Polyneuropathy, unspecified: Secondary | ICD-10-CM | POA: Insufficient documentation

## 2024-10-05 DIAGNOSIS — I110 Hypertensive heart disease with heart failure: Secondary | ICD-10-CM | POA: Insufficient documentation

## 2024-10-05 DIAGNOSIS — I509 Heart failure, unspecified: Secondary | ICD-10-CM | POA: Insufficient documentation

## 2024-10-05 DIAGNOSIS — M4800 Spinal stenosis, site unspecified: Secondary | ICD-10-CM | POA: Insufficient documentation

## 2024-10-05 DIAGNOSIS — E854 Organ-limited amyloidosis: Secondary | ICD-10-CM | POA: Insufficient documentation

## 2024-10-05 DIAGNOSIS — I43 Cardiomyopathy in diseases classified elsewhere: Secondary | ICD-10-CM | POA: Insufficient documentation

## 2024-10-05 DIAGNOSIS — Z79899 Other long term (current) drug therapy: Secondary | ICD-10-CM | POA: Insufficient documentation

## 2024-10-05 DIAGNOSIS — I480 Paroxysmal atrial fibrillation: Secondary | ICD-10-CM | POA: Insufficient documentation

## 2024-10-05 NOTE — Patient Instructions (Signed)
 There has been no changes to your medications.  Your physician has requested that you have an echocardiogram. Echocardiography is a painless test that uses sound waves to create images of your heart. It provides your doctor with information about the size and shape of your heart and how well your hearts chambers and valves are working. This procedure takes approximately one hour. There are no restrictions for this procedure. Please do NOT wear cologne, perfume, aftershave, or lotions (deodorant is allowed). Please arrive 15 minutes prior to your appointment time.  Please note: We ask at that you not bring children with you during ultrasound (echo/ vascular) testing. Due to room size and safety concerns, children are not allowed in the ultrasound rooms during exams. Our front office staff cannot provide observation of children in our lobby area while testing is being conducted. An adult accompanying a patient to their appointment will only be allowed in the ultrasound room at the discretion of the ultrasound technician under special circumstances. We apologize for any inconvenience.  Your physician recommends that you schedule a follow-up appointment in: 6 months ( August) ** PLEASE CALL THE OFFICE IN JUNE TO ARRANGE YOUR FOLLOW UP APPOINTMENT.**  If you have any questions or concerns before your next appointment please send us  a message through [place] or call our office at [phone].    TO LEAVE A MESSAGE FOR THE NURSE SELECT OPTION 2, PLEASE LEAVE A MESSAGE INCLUDING: YOUR NAME DATE OF BIRTH CALL BACK NUMBER REASON FOR CALL**this is important as we prioritize the call backs  YOU WILL RECEIVE A CALL BACK THE SAME DAY AS LONG AS YOU CALL BEFORE 4:00 PM  At the Advanced [facility] Failure Clinic, you and your health needs are our priority. As part of our continuing mission to provide you with exceptional heart care, we have created designated Provider Care Teams. These Care Teams include your  primary Cardiologist (physician) and Advanced Practice Providers (APPs- Physician Assistants and Nurse Practitioners) who all work together to provide you with the care you need, when you need it.   You may see any of the following providers on your designated Care Team at your next follow up: Dr Toribio Fuel Dr Ezra Shuck Dr. [name] Greig Mosses, NP Caffie Shed, [place] [facility] [place], [place] Beckey Coe, NP Jordan Lee, NP Ellouise Class, NP Tinnie Redman, PharmD Jaun Bash, PharmD   Please be sure to bring in all your medications bottles to every appointment.    Thank you for choosing [facility]

## 2024-10-26 ENCOUNTER — Ambulatory Visit (HOSPITAL_COMMUNITY)
